# Patient Record
Sex: Male | Born: 1945
Health system: Southern US, Community
[De-identification: ages and names within clinical notes are randomized; demographics above are authoritative.]

## PROBLEM LIST (undated history)

## (undated) DIAGNOSIS — N184 Chronic kidney disease, stage 4 (severe): Secondary | ICD-10-CM

## (undated) DIAGNOSIS — I340 Nonrheumatic mitral (valve) insufficiency: Secondary | ICD-10-CM

## (undated) DIAGNOSIS — I4819 Other persistent atrial fibrillation: Secondary | ICD-10-CM

## (undated) DIAGNOSIS — I2510 Atherosclerotic heart disease of native coronary artery without angina pectoris: Secondary | ICD-10-CM

## (undated) DIAGNOSIS — Z950 Presence of cardiac pacemaker: Secondary | ICD-10-CM

## (undated) DIAGNOSIS — I255 Ischemic cardiomyopathy: Secondary | ICD-10-CM

## (undated) DIAGNOSIS — I4891 Unspecified atrial fibrillation: Secondary | ICD-10-CM

## (undated) DIAGNOSIS — E119 Type 2 diabetes mellitus without complications: Secondary | ICD-10-CM

## (undated) DIAGNOSIS — I10 Essential (primary) hypertension: Secondary | ICD-10-CM

## (undated) DIAGNOSIS — I2119 ST elevation (STEMI) myocardial infarction involving other coronary artery of inferior wall: Secondary | ICD-10-CM

## (undated) DIAGNOSIS — E1169 Type 2 diabetes mellitus with other specified complication: Secondary | ICD-10-CM

## (undated) DIAGNOSIS — I495 Sick sinus syndrome: Secondary | ICD-10-CM

## (undated) DIAGNOSIS — E785 Hyperlipidemia, unspecified: Secondary | ICD-10-CM

## (undated) DIAGNOSIS — E782 Mixed hyperlipidemia: Secondary | ICD-10-CM

## (undated) DIAGNOSIS — I509 Heart failure, unspecified: Secondary | ICD-10-CM

## (undated) DIAGNOSIS — I482 Chronic atrial fibrillation, unspecified: Secondary | ICD-10-CM

## (undated) DIAGNOSIS — I219 Acute myocardial infarction, unspecified: Secondary | ICD-10-CM

## (undated) HISTORY — DX: Atherosclerotic heart disease of native coronary artery without angina pectoris: I25.10

## (undated) HISTORY — PX: PACEMAKER INSERTION: SHX728

## (undated) HISTORY — PX: COLON SURGERY: SHX602

## (undated) HISTORY — DX: Type 2 diabetes mellitus with other specified complication: E11.69

## (undated) HISTORY — DX: Essential (primary) hypertension: I10

## (undated) HISTORY — DX: Chronic atrial fibrillation, unspecified: I48.20

## (undated) HISTORY — DX: Heart failure, unspecified: I50.9

## (undated) HISTORY — DX: Mixed hyperlipidemia: E78.2

## (undated) HISTORY — PX: CORONARY ANGIOPLASTY WITH STENT PLACEMENT: SHX49

## (undated) HISTORY — DX: Hyperlipidemia, unspecified: E78.5

---
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
## 2011-05-24 DIAGNOSIS — G4733 Obstructive sleep apnea (adult) (pediatric): Secondary | ICD-10-CM | POA: Diagnosis not present

## 2011-06-26 DIAGNOSIS — I10 Essential (primary) hypertension: Secondary | ICD-10-CM | POA: Diagnosis not present

## 2011-06-26 DIAGNOSIS — I2510 Atherosclerotic heart disease of native coronary artery without angina pectoris: Secondary | ICD-10-CM | POA: Diagnosis not present

## 2011-06-26 DIAGNOSIS — Z79899 Other long term (current) drug therapy: Secondary | ICD-10-CM | POA: Diagnosis not present

## 2011-06-26 DIAGNOSIS — E7800 Pure hypercholesterolemia, unspecified: Secondary | ICD-10-CM | POA: Diagnosis not present

## 2011-07-07 DIAGNOSIS — E785 Hyperlipidemia, unspecified: Secondary | ICD-10-CM | POA: Diagnosis not present

## 2011-07-07 DIAGNOSIS — I2510 Atherosclerotic heart disease of native coronary artery without angina pectoris: Secondary | ICD-10-CM | POA: Diagnosis not present

## 2011-07-07 DIAGNOSIS — I10 Essential (primary) hypertension: Secondary | ICD-10-CM | POA: Diagnosis not present

## 2011-07-07 DIAGNOSIS — Z79899 Other long term (current) drug therapy: Secondary | ICD-10-CM | POA: Diagnosis not present

## 2011-09-18 DIAGNOSIS — E785 Hyperlipidemia, unspecified: Secondary | ICD-10-CM | POA: Diagnosis not present

## 2011-09-25 DIAGNOSIS — M159 Polyosteoarthritis, unspecified: Secondary | ICD-10-CM | POA: Diagnosis not present

## 2011-09-25 DIAGNOSIS — I10 Essential (primary) hypertension: Secondary | ICD-10-CM | POA: Diagnosis not present

## 2011-09-25 DIAGNOSIS — E119 Type 2 diabetes mellitus without complications: Secondary | ICD-10-CM | POA: Diagnosis not present

## 2011-09-25 DIAGNOSIS — E7800 Pure hypercholesterolemia, unspecified: Secondary | ICD-10-CM | POA: Diagnosis not present

## 2011-10-20 DIAGNOSIS — B354 Tinea corporis: Secondary | ICD-10-CM | POA: Diagnosis not present

## 2011-11-22 DIAGNOSIS — X58XXXA Exposure to other specified factors, initial encounter: Secondary | ICD-10-CM | POA: Diagnosis not present

## 2011-11-22 DIAGNOSIS — T148XXA Other injury of unspecified body region, initial encounter: Secondary | ICD-10-CM | POA: Diagnosis not present

## 2011-11-22 DIAGNOSIS — N318 Other neuromuscular dysfunction of bladder: Secondary | ICD-10-CM | POA: Diagnosis not present

## 2011-12-20 DIAGNOSIS — R7989 Other specified abnormal findings of blood chemistry: Secondary | ICD-10-CM | POA: Diagnosis not present

## 2011-12-20 DIAGNOSIS — I10 Essential (primary) hypertension: Secondary | ICD-10-CM | POA: Diagnosis not present

## 2011-12-20 DIAGNOSIS — E7800 Pure hypercholesterolemia, unspecified: Secondary | ICD-10-CM | POA: Diagnosis not present

## 2011-12-27 DIAGNOSIS — I10 Essential (primary) hypertension: Secondary | ICD-10-CM | POA: Diagnosis not present

## 2011-12-27 DIAGNOSIS — E119 Type 2 diabetes mellitus without complications: Secondary | ICD-10-CM | POA: Diagnosis not present

## 2011-12-27 DIAGNOSIS — E785 Hyperlipidemia, unspecified: Secondary | ICD-10-CM | POA: Diagnosis not present

## 2012-01-01 DIAGNOSIS — R809 Proteinuria, unspecified: Secondary | ICD-10-CM | POA: Diagnosis not present

## 2012-01-12 DIAGNOSIS — E785 Hyperlipidemia, unspecified: Secondary | ICD-10-CM | POA: Diagnosis not present

## 2012-01-12 DIAGNOSIS — I2510 Atherosclerotic heart disease of native coronary artery without angina pectoris: Secondary | ICD-10-CM | POA: Diagnosis not present

## 2012-01-12 DIAGNOSIS — I10 Essential (primary) hypertension: Secondary | ICD-10-CM | POA: Diagnosis not present

## 2012-02-12 DIAGNOSIS — Z23 Encounter for immunization: Secondary | ICD-10-CM | POA: Diagnosis not present

## 2012-03-18 DIAGNOSIS — E119 Type 2 diabetes mellitus without complications: Secondary | ICD-10-CM | POA: Diagnosis not present

## 2012-03-28 DIAGNOSIS — E119 Type 2 diabetes mellitus without complications: Secondary | ICD-10-CM | POA: Diagnosis not present

## 2012-03-28 DIAGNOSIS — I10 Essential (primary) hypertension: Secondary | ICD-10-CM | POA: Diagnosis not present

## 2012-03-28 DIAGNOSIS — E785 Hyperlipidemia, unspecified: Secondary | ICD-10-CM | POA: Diagnosis not present

## 2012-05-23 DIAGNOSIS — G4733 Obstructive sleep apnea (adult) (pediatric): Secondary | ICD-10-CM | POA: Diagnosis not present

## 2012-05-23 DIAGNOSIS — I509 Heart failure, unspecified: Secondary | ICD-10-CM | POA: Diagnosis not present

## 2012-06-21 DIAGNOSIS — E119 Type 2 diabetes mellitus without complications: Secondary | ICD-10-CM | POA: Diagnosis not present

## 2012-06-21 DIAGNOSIS — E785 Hyperlipidemia, unspecified: Secondary | ICD-10-CM | POA: Diagnosis not present

## 2012-06-21 DIAGNOSIS — I10 Essential (primary) hypertension: Secondary | ICD-10-CM | POA: Diagnosis not present

## 2012-07-03 DIAGNOSIS — E785 Hyperlipidemia, unspecified: Secondary | ICD-10-CM | POA: Diagnosis not present

## 2012-07-03 DIAGNOSIS — I2510 Atherosclerotic heart disease of native coronary artery without angina pectoris: Secondary | ICD-10-CM | POA: Diagnosis not present

## 2012-07-03 DIAGNOSIS — I10 Essential (primary) hypertension: Secondary | ICD-10-CM | POA: Diagnosis not present

## 2012-07-03 DIAGNOSIS — E119 Type 2 diabetes mellitus without complications: Secondary | ICD-10-CM | POA: Diagnosis not present

## 2012-07-09 DIAGNOSIS — E785 Hyperlipidemia, unspecified: Secondary | ICD-10-CM | POA: Diagnosis not present

## 2012-07-09 DIAGNOSIS — I2510 Atherosclerotic heart disease of native coronary artery without angina pectoris: Secondary | ICD-10-CM | POA: Diagnosis not present

## 2012-07-09 DIAGNOSIS — I10 Essential (primary) hypertension: Secondary | ICD-10-CM | POA: Diagnosis not present

## 2012-09-19 DIAGNOSIS — E119 Type 2 diabetes mellitus without complications: Secondary | ICD-10-CM | POA: Diagnosis not present

## 2012-09-19 DIAGNOSIS — D696 Thrombocytopenia, unspecified: Secondary | ICD-10-CM | POA: Diagnosis not present

## 2012-09-26 DIAGNOSIS — E119 Type 2 diabetes mellitus without complications: Secondary | ICD-10-CM | POA: Diagnosis not present

## 2012-09-26 DIAGNOSIS — I10 Essential (primary) hypertension: Secondary | ICD-10-CM | POA: Diagnosis not present

## 2012-09-26 DIAGNOSIS — L919 Hypertrophic disorder of the skin, unspecified: Secondary | ICD-10-CM | POA: Diagnosis not present

## 2012-09-26 DIAGNOSIS — M159 Polyosteoarthritis, unspecified: Secondary | ICD-10-CM | POA: Diagnosis not present

## 2012-09-26 DIAGNOSIS — E785 Hyperlipidemia, unspecified: Secondary | ICD-10-CM | POA: Diagnosis not present

## 2012-09-26 DIAGNOSIS — L909 Atrophic disorder of skin, unspecified: Secondary | ICD-10-CM | POA: Diagnosis not present

## 2012-12-30 DIAGNOSIS — Z79899 Other long term (current) drug therapy: Secondary | ICD-10-CM | POA: Diagnosis not present

## 2012-12-30 DIAGNOSIS — I2510 Atherosclerotic heart disease of native coronary artery without angina pectoris: Secondary | ICD-10-CM | POA: Diagnosis not present

## 2012-12-30 DIAGNOSIS — E785 Hyperlipidemia, unspecified: Secondary | ICD-10-CM | POA: Diagnosis not present

## 2012-12-30 DIAGNOSIS — Z23 Encounter for immunization: Secondary | ICD-10-CM | POA: Diagnosis not present

## 2012-12-30 DIAGNOSIS — E119 Type 2 diabetes mellitus without complications: Secondary | ICD-10-CM | POA: Diagnosis not present

## 2012-12-30 DIAGNOSIS — I10 Essential (primary) hypertension: Secondary | ICD-10-CM | POA: Diagnosis not present

## 2013-03-25 DIAGNOSIS — I10 Essential (primary) hypertension: Secondary | ICD-10-CM | POA: Diagnosis not present

## 2013-03-25 DIAGNOSIS — E785 Hyperlipidemia, unspecified: Secondary | ICD-10-CM | POA: Diagnosis not present

## 2013-03-25 DIAGNOSIS — E119 Type 2 diabetes mellitus without complications: Secondary | ICD-10-CM | POA: Diagnosis not present

## 2013-04-01 DIAGNOSIS — E119 Type 2 diabetes mellitus without complications: Secondary | ICD-10-CM | POA: Diagnosis not present

## 2013-04-01 DIAGNOSIS — N529 Male erectile dysfunction, unspecified: Secondary | ICD-10-CM | POA: Diagnosis not present

## 2013-04-01 DIAGNOSIS — E785 Hyperlipidemia, unspecified: Secondary | ICD-10-CM | POA: Diagnosis not present

## 2013-04-01 DIAGNOSIS — I10 Essential (primary) hypertension: Secondary | ICD-10-CM | POA: Diagnosis not present

## 2013-05-22 DIAGNOSIS — G4733 Obstructive sleep apnea (adult) (pediatric): Secondary | ICD-10-CM | POA: Diagnosis not present

## 2013-06-25 DIAGNOSIS — E7800 Pure hypercholesterolemia, unspecified: Secondary | ICD-10-CM | POA: Diagnosis not present

## 2013-06-25 DIAGNOSIS — E119 Type 2 diabetes mellitus without complications: Secondary | ICD-10-CM | POA: Diagnosis not present

## 2013-06-25 DIAGNOSIS — Z719 Counseling, unspecified: Secondary | ICD-10-CM | POA: Diagnosis not present

## 2013-06-25 DIAGNOSIS — E785 Hyperlipidemia, unspecified: Secondary | ICD-10-CM | POA: Diagnosis not present

## 2013-06-25 DIAGNOSIS — I10 Essential (primary) hypertension: Secondary | ICD-10-CM | POA: Diagnosis not present

## 2013-07-21 DIAGNOSIS — E669 Obesity, unspecified: Secondary | ICD-10-CM | POA: Diagnosis not present

## 2013-07-21 DIAGNOSIS — Z9861 Coronary angioplasty status: Secondary | ICD-10-CM | POA: Diagnosis not present

## 2013-07-21 DIAGNOSIS — E785 Hyperlipidemia, unspecified: Secondary | ICD-10-CM | POA: Diagnosis not present

## 2013-07-21 DIAGNOSIS — I2510 Atherosclerotic heart disease of native coronary artery without angina pectoris: Secondary | ICD-10-CM | POA: Diagnosis not present

## 2013-07-30 DIAGNOSIS — I2510 Atherosclerotic heart disease of native coronary artery without angina pectoris: Secondary | ICD-10-CM | POA: Diagnosis not present

## 2013-07-30 DIAGNOSIS — I7090 Unspecified atherosclerosis: Secondary | ICD-10-CM | POA: Diagnosis not present

## 2013-07-30 DIAGNOSIS — E785 Hyperlipidemia, unspecified: Secondary | ICD-10-CM | POA: Diagnosis not present

## 2013-07-30 DIAGNOSIS — Z9889 Other specified postprocedural states: Secondary | ICD-10-CM | POA: Diagnosis not present

## 2013-07-30 DIAGNOSIS — E119 Type 2 diabetes mellitus without complications: Secondary | ICD-10-CM | POA: Diagnosis not present

## 2013-07-30 DIAGNOSIS — I10 Essential (primary) hypertension: Secondary | ICD-10-CM | POA: Diagnosis not present

## 2013-10-21 DIAGNOSIS — I10 Essential (primary) hypertension: Secondary | ICD-10-CM | POA: Diagnosis not present

## 2013-10-21 DIAGNOSIS — Z125 Encounter for screening for malignant neoplasm of prostate: Secondary | ICD-10-CM | POA: Diagnosis not present

## 2013-10-21 DIAGNOSIS — E785 Hyperlipidemia, unspecified: Secondary | ICD-10-CM | POA: Diagnosis not present

## 2013-10-28 DIAGNOSIS — E785 Hyperlipidemia, unspecified: Secondary | ICD-10-CM | POA: Diagnosis not present

## 2013-10-28 DIAGNOSIS — Z139 Encounter for screening, unspecified: Secondary | ICD-10-CM | POA: Diagnosis not present

## 2013-10-28 DIAGNOSIS — R7301 Impaired fasting glucose: Secondary | ICD-10-CM | POA: Diagnosis not present

## 2013-10-28 DIAGNOSIS — I10 Essential (primary) hypertension: Secondary | ICD-10-CM | POA: Diagnosis not present

## 2013-10-28 DIAGNOSIS — E039 Hypothyroidism, unspecified: Secondary | ICD-10-CM | POA: Diagnosis not present

## 2013-10-28 DIAGNOSIS — Z1331 Encounter for screening for depression: Secondary | ICD-10-CM | POA: Diagnosis not present

## 2013-12-02 DIAGNOSIS — Z6841 Body Mass Index (BMI) 40.0 and over, adult: Secondary | ICD-10-CM | POA: Diagnosis not present

## 2013-12-05 DIAGNOSIS — Z1389 Encounter for screening for other disorder: Secondary | ICD-10-CM | POA: Diagnosis not present

## 2013-12-05 DIAGNOSIS — Z139 Encounter for screening, unspecified: Secondary | ICD-10-CM | POA: Diagnosis not present

## 2013-12-05 DIAGNOSIS — Z0000 Encounter for general adult medical examination without abnormal findings: Secondary | ICD-10-CM | POA: Diagnosis not present

## 2013-12-05 DIAGNOSIS — Z1331 Encounter for screening for depression: Secondary | ICD-10-CM | POA: Diagnosis not present

## 2013-12-09 DIAGNOSIS — Z6841 Body Mass Index (BMI) 40.0 and over, adult: Secondary | ICD-10-CM | POA: Diagnosis not present

## 2013-12-16 DIAGNOSIS — E785 Hyperlipidemia, unspecified: Secondary | ICD-10-CM | POA: Diagnosis not present

## 2013-12-16 DIAGNOSIS — Z6841 Body Mass Index (BMI) 40.0 and over, adult: Secondary | ICD-10-CM | POA: Diagnosis not present

## 2013-12-31 DIAGNOSIS — Z6841 Body Mass Index (BMI) 40.0 and over, adult: Secondary | ICD-10-CM | POA: Diagnosis not present

## 2014-01-14 DIAGNOSIS — Z6841 Body Mass Index (BMI) 40.0 and over, adult: Secondary | ICD-10-CM | POA: Diagnosis not present

## 2014-01-28 DIAGNOSIS — Z23 Encounter for immunization: Secondary | ICD-10-CM | POA: Diagnosis not present

## 2014-02-11 DIAGNOSIS — E785 Hyperlipidemia, unspecified: Secondary | ICD-10-CM | POA: Diagnosis not present

## 2014-02-11 DIAGNOSIS — Z6841 Body Mass Index (BMI) 40.0 and over, adult: Secondary | ICD-10-CM | POA: Diagnosis not present

## 2014-02-11 DIAGNOSIS — E1165 Type 2 diabetes mellitus with hyperglycemia: Secondary | ICD-10-CM | POA: Diagnosis not present

## 2014-02-11 DIAGNOSIS — I10 Essential (primary) hypertension: Secondary | ICD-10-CM | POA: Diagnosis not present

## 2014-02-18 DIAGNOSIS — E785 Hyperlipidemia, unspecified: Secondary | ICD-10-CM | POA: Diagnosis not present

## 2014-02-18 DIAGNOSIS — E1169 Type 2 diabetes mellitus with other specified complication: Secondary | ICD-10-CM | POA: Diagnosis not present

## 2014-02-18 DIAGNOSIS — E0869 Diabetes mellitus due to underlying condition with other specified complication: Secondary | ICD-10-CM | POA: Diagnosis not present

## 2014-02-18 DIAGNOSIS — E119 Type 2 diabetes mellitus without complications: Secondary | ICD-10-CM | POA: Diagnosis not present

## 2014-03-10 DIAGNOSIS — E1165 Type 2 diabetes mellitus with hyperglycemia: Secondary | ICD-10-CM | POA: Diagnosis not present

## 2014-03-31 DIAGNOSIS — E119 Type 2 diabetes mellitus without complications: Secondary | ICD-10-CM | POA: Diagnosis not present

## 2014-06-16 DIAGNOSIS — E1169 Type 2 diabetes mellitus with other specified complication: Secondary | ICD-10-CM | POA: Diagnosis not present

## 2014-06-16 DIAGNOSIS — E119 Type 2 diabetes mellitus without complications: Secondary | ICD-10-CM | POA: Diagnosis not present

## 2014-06-16 DIAGNOSIS — E785 Hyperlipidemia, unspecified: Secondary | ICD-10-CM | POA: Diagnosis not present

## 2016-05-31 DIAGNOSIS — D696 Thrombocytopenia, unspecified: Secondary | ICD-10-CM | POA: Diagnosis not present

## 2016-05-31 DIAGNOSIS — E785 Hyperlipidemia, unspecified: Secondary | ICD-10-CM | POA: Diagnosis not present

## 2016-05-31 DIAGNOSIS — E1159 Type 2 diabetes mellitus with other circulatory complications: Secondary | ICD-10-CM | POA: Diagnosis not present

## 2016-05-31 DIAGNOSIS — E119 Type 2 diabetes mellitus without complications: Secondary | ICD-10-CM | POA: Diagnosis not present

## 2016-06-08 DIAGNOSIS — E785 Hyperlipidemia, unspecified: Secondary | ICD-10-CM | POA: Diagnosis not present

## 2016-06-08 DIAGNOSIS — E119 Type 2 diabetes mellitus without complications: Secondary | ICD-10-CM | POA: Diagnosis not present

## 2016-06-08 DIAGNOSIS — I10 Essential (primary) hypertension: Secondary | ICD-10-CM | POA: Diagnosis not present

## 2016-06-08 DIAGNOSIS — E1169 Type 2 diabetes mellitus with other specified complication: Secondary | ICD-10-CM | POA: Diagnosis not present

## 2016-06-13 DIAGNOSIS — Z9861 Coronary angioplasty status: Secondary | ICD-10-CM | POA: Diagnosis not present

## 2016-06-13 DIAGNOSIS — D696 Thrombocytopenia, unspecified: Secondary | ICD-10-CM | POA: Diagnosis not present

## 2016-06-13 DIAGNOSIS — I2510 Atherosclerotic heart disease of native coronary artery without angina pectoris: Secondary | ICD-10-CM | POA: Diagnosis not present

## 2016-06-13 DIAGNOSIS — E1169 Type 2 diabetes mellitus with other specified complication: Secondary | ICD-10-CM | POA: Diagnosis not present

## 2016-06-13 DIAGNOSIS — I252 Old myocardial infarction: Secondary | ICD-10-CM | POA: Diagnosis not present

## 2016-06-14 DIAGNOSIS — M25561 Pain in right knee: Secondary | ICD-10-CM | POA: Diagnosis not present

## 2016-06-14 DIAGNOSIS — M1711 Unilateral primary osteoarthritis, right knee: Secondary | ICD-10-CM | POA: Diagnosis not present

## 2016-06-21 DIAGNOSIS — M1711 Unilateral primary osteoarthritis, right knee: Secondary | ICD-10-CM | POA: Diagnosis not present

## 2016-06-27 DIAGNOSIS — M6281 Muscle weakness (generalized): Secondary | ICD-10-CM | POA: Diagnosis not present

## 2016-06-27 DIAGNOSIS — M1711 Unilateral primary osteoarthritis, right knee: Secondary | ICD-10-CM | POA: Diagnosis not present

## 2016-06-27 DIAGNOSIS — R2689 Other abnormalities of gait and mobility: Secondary | ICD-10-CM | POA: Diagnosis not present

## 2016-06-27 DIAGNOSIS — M25561 Pain in right knee: Secondary | ICD-10-CM | POA: Diagnosis not present

## 2016-06-27 DIAGNOSIS — M25661 Stiffness of right knee, not elsewhere classified: Secondary | ICD-10-CM | POA: Diagnosis not present

## 2016-07-03 DIAGNOSIS — M6281 Muscle weakness (generalized): Secondary | ICD-10-CM | POA: Diagnosis not present

## 2016-07-03 DIAGNOSIS — M1711 Unilateral primary osteoarthritis, right knee: Secondary | ICD-10-CM | POA: Diagnosis not present

## 2016-07-03 DIAGNOSIS — R2689 Other abnormalities of gait and mobility: Secondary | ICD-10-CM | POA: Diagnosis not present

## 2016-07-03 DIAGNOSIS — M25561 Pain in right knee: Secondary | ICD-10-CM | POA: Diagnosis not present

## 2016-07-03 DIAGNOSIS — M25661 Stiffness of right knee, not elsewhere classified: Secondary | ICD-10-CM | POA: Diagnosis not present

## 2016-07-05 DIAGNOSIS — M25561 Pain in right knee: Secondary | ICD-10-CM | POA: Diagnosis not present

## 2016-07-05 DIAGNOSIS — M25661 Stiffness of right knee, not elsewhere classified: Secondary | ICD-10-CM | POA: Diagnosis not present

## 2016-07-05 DIAGNOSIS — R2689 Other abnormalities of gait and mobility: Secondary | ICD-10-CM | POA: Diagnosis not present

## 2016-07-05 DIAGNOSIS — M6281 Muscle weakness (generalized): Secondary | ICD-10-CM | POA: Diagnosis not present

## 2016-07-05 DIAGNOSIS — M1711 Unilateral primary osteoarthritis, right knee: Secondary | ICD-10-CM | POA: Diagnosis not present

## 2016-07-12 DIAGNOSIS — M1711 Unilateral primary osteoarthritis, right knee: Secondary | ICD-10-CM | POA: Diagnosis not present

## 2016-07-12 DIAGNOSIS — M25561 Pain in right knee: Secondary | ICD-10-CM | POA: Diagnosis not present

## 2016-07-12 DIAGNOSIS — M25661 Stiffness of right knee, not elsewhere classified: Secondary | ICD-10-CM | POA: Diagnosis not present

## 2016-07-12 DIAGNOSIS — R2689 Other abnormalities of gait and mobility: Secondary | ICD-10-CM | POA: Diagnosis not present

## 2016-07-12 DIAGNOSIS — M6281 Muscle weakness (generalized): Secondary | ICD-10-CM | POA: Diagnosis not present

## 2016-07-26 DIAGNOSIS — M1711 Unilateral primary osteoarthritis, right knee: Secondary | ICD-10-CM | POA: Diagnosis not present

## 2016-08-02 DIAGNOSIS — M1711 Unilateral primary osteoarthritis, right knee: Secondary | ICD-10-CM | POA: Diagnosis not present

## 2016-08-09 DIAGNOSIS — M1711 Unilateral primary osteoarthritis, right knee: Secondary | ICD-10-CM | POA: Diagnosis not present

## 2016-08-14 DIAGNOSIS — G4733 Obstructive sleep apnea (adult) (pediatric): Secondary | ICD-10-CM | POA: Diagnosis not present

## 2016-08-21 DIAGNOSIS — G4733 Obstructive sleep apnea (adult) (pediatric): Secondary | ICD-10-CM | POA: Diagnosis not present

## 2016-09-08 DIAGNOSIS — M1711 Unilateral primary osteoarthritis, right knee: Secondary | ICD-10-CM | POA: Diagnosis not present

## 2016-09-15 DIAGNOSIS — E119 Type 2 diabetes mellitus without complications: Secondary | ICD-10-CM | POA: Diagnosis not present

## 2016-09-15 DIAGNOSIS — E039 Hypothyroidism, unspecified: Secondary | ICD-10-CM | POA: Diagnosis not present

## 2016-09-15 DIAGNOSIS — E1169 Type 2 diabetes mellitus with other specified complication: Secondary | ICD-10-CM | POA: Diagnosis not present

## 2016-09-15 DIAGNOSIS — I10 Essential (primary) hypertension: Secondary | ICD-10-CM | POA: Diagnosis not present

## 2016-09-26 DIAGNOSIS — E785 Hyperlipidemia, unspecified: Secondary | ICD-10-CM | POA: Diagnosis not present

## 2016-09-26 DIAGNOSIS — E1369 Other specified diabetes mellitus with other specified complication: Secondary | ICD-10-CM | POA: Diagnosis not present

## 2016-09-26 DIAGNOSIS — E039 Hypothyroidism, unspecified: Secondary | ICD-10-CM | POA: Diagnosis not present

## 2016-09-26 DIAGNOSIS — E1169 Type 2 diabetes mellitus with other specified complication: Secondary | ICD-10-CM | POA: Diagnosis not present

## 2016-10-06 DIAGNOSIS — E119 Type 2 diabetes mellitus without complications: Secondary | ICD-10-CM | POA: Diagnosis not present

## 2016-10-06 DIAGNOSIS — H02836 Dermatochalasis of left eye, unspecified eyelid: Secondary | ICD-10-CM | POA: Diagnosis not present

## 2016-10-06 DIAGNOSIS — H26493 Other secondary cataract, bilateral: Secondary | ICD-10-CM | POA: Diagnosis not present

## 2016-10-06 DIAGNOSIS — H02233 Paralytic lagophthalmos right eye, unspecified eyelid: Secondary | ICD-10-CM | POA: Diagnosis not present

## 2016-10-17 DIAGNOSIS — Z789 Other specified health status: Secondary | ICD-10-CM | POA: Diagnosis not present

## 2016-10-17 DIAGNOSIS — Z139 Encounter for screening, unspecified: Secondary | ICD-10-CM | POA: Diagnosis not present

## 2016-10-17 DIAGNOSIS — Z1389 Encounter for screening for other disorder: Secondary | ICD-10-CM | POA: Diagnosis not present

## 2016-10-17 DIAGNOSIS — Z0000 Encounter for general adult medical examination without abnormal findings: Secondary | ICD-10-CM | POA: Diagnosis not present

## 2016-11-13 DIAGNOSIS — G4733 Obstructive sleep apnea (adult) (pediatric): Secondary | ICD-10-CM | POA: Diagnosis not present

## 2016-12-04 DIAGNOSIS — E1159 Type 2 diabetes mellitus with other circulatory complications: Secondary | ICD-10-CM | POA: Diagnosis not present

## 2016-12-04 DIAGNOSIS — Z789 Other specified health status: Secondary | ICD-10-CM | POA: Diagnosis not present

## 2016-12-04 DIAGNOSIS — I10 Essential (primary) hypertension: Secondary | ICD-10-CM | POA: Diagnosis not present

## 2016-12-04 DIAGNOSIS — G4733 Obstructive sleep apnea (adult) (pediatric): Secondary | ICD-10-CM | POA: Diagnosis not present

## 2016-12-15 DIAGNOSIS — B3742 Candidal balanitis: Secondary | ICD-10-CM | POA: Diagnosis not present

## 2016-12-15 DIAGNOSIS — G4733 Obstructive sleep apnea (adult) (pediatric): Secondary | ICD-10-CM | POA: Diagnosis not present

## 2016-12-21 DIAGNOSIS — E1159 Type 2 diabetes mellitus with other circulatory complications: Secondary | ICD-10-CM | POA: Diagnosis not present

## 2016-12-21 DIAGNOSIS — Z7189 Other specified counseling: Secondary | ICD-10-CM | POA: Diagnosis not present

## 2016-12-21 DIAGNOSIS — I10 Essential (primary) hypertension: Secondary | ICD-10-CM | POA: Diagnosis not present

## 2016-12-21 DIAGNOSIS — T7840XA Allergy, unspecified, initial encounter: Secondary | ICD-10-CM | POA: Diagnosis not present

## 2016-12-21 DIAGNOSIS — E785 Hyperlipidemia, unspecified: Secondary | ICD-10-CM | POA: Diagnosis not present

## 2016-12-27 DIAGNOSIS — I480 Paroxysmal atrial fibrillation: Secondary | ICD-10-CM | POA: Diagnosis not present

## 2016-12-27 DIAGNOSIS — D696 Thrombocytopenia, unspecified: Secondary | ICD-10-CM | POA: Diagnosis not present

## 2016-12-27 DIAGNOSIS — I252 Old myocardial infarction: Secondary | ICD-10-CM | POA: Insufficient documentation

## 2016-12-27 DIAGNOSIS — I452 Bifascicular block: Secondary | ICD-10-CM | POA: Insufficient documentation

## 2016-12-27 DIAGNOSIS — E785 Hyperlipidemia, unspecified: Secondary | ICD-10-CM | POA: Insufficient documentation

## 2016-12-27 DIAGNOSIS — I2510 Atherosclerotic heart disease of native coronary artery without angina pectoris: Secondary | ICD-10-CM | POA: Diagnosis not present

## 2016-12-27 DIAGNOSIS — Z955 Presence of coronary angioplasty implant and graft: Secondary | ICD-10-CM | POA: Insufficient documentation

## 2016-12-27 DIAGNOSIS — I4891 Unspecified atrial fibrillation: Secondary | ICD-10-CM | POA: Diagnosis not present

## 2016-12-29 DIAGNOSIS — I480 Paroxysmal atrial fibrillation: Secondary | ICD-10-CM | POA: Diagnosis not present

## 2017-01-02 DIAGNOSIS — I509 Heart failure, unspecified: Secondary | ICD-10-CM | POA: Diagnosis not present

## 2017-01-02 DIAGNOSIS — R0602 Shortness of breath: Secondary | ICD-10-CM | POA: Diagnosis not present

## 2017-01-02 DIAGNOSIS — R0601 Orthopnea: Secondary | ICD-10-CM | POA: Diagnosis not present

## 2017-01-02 DIAGNOSIS — R0600 Dyspnea, unspecified: Secondary | ICD-10-CM | POA: Diagnosis not present

## 2017-01-02 DIAGNOSIS — K8020 Calculus of gallbladder without cholecystitis without obstruction: Secondary | ICD-10-CM | POA: Diagnosis not present

## 2017-01-10 DIAGNOSIS — I10 Essential (primary) hypertension: Secondary | ICD-10-CM | POA: Diagnosis not present

## 2017-01-10 DIAGNOSIS — D696 Thrombocytopenia, unspecified: Secondary | ICD-10-CM | POA: Diagnosis not present

## 2017-01-10 DIAGNOSIS — E1169 Type 2 diabetes mellitus with other specified complication: Secondary | ICD-10-CM | POA: Diagnosis not present

## 2017-01-10 DIAGNOSIS — E119 Type 2 diabetes mellitus without complications: Secondary | ICD-10-CM | POA: Diagnosis not present

## 2017-01-10 DIAGNOSIS — E039 Hypothyroidism, unspecified: Secondary | ICD-10-CM | POA: Diagnosis not present

## 2017-01-12 DIAGNOSIS — Z23 Encounter for immunization: Secondary | ICD-10-CM | POA: Diagnosis not present

## 2017-01-15 DIAGNOSIS — G4733 Obstructive sleep apnea (adult) (pediatric): Secondary | ICD-10-CM | POA: Diagnosis not present

## 2017-01-16 DIAGNOSIS — I4819 Other persistent atrial fibrillation: Secondary | ICD-10-CM | POA: Insufficient documentation

## 2017-01-17 DIAGNOSIS — I440 Atrioventricular block, first degree: Secondary | ICD-10-CM | POA: Diagnosis not present

## 2017-01-17 DIAGNOSIS — I481 Persistent atrial fibrillation: Secondary | ICD-10-CM | POA: Diagnosis not present

## 2017-01-17 DIAGNOSIS — I4510 Unspecified right bundle-branch block: Secondary | ICD-10-CM | POA: Diagnosis not present

## 2017-01-17 DIAGNOSIS — E785 Hyperlipidemia, unspecified: Secondary | ICD-10-CM | POA: Diagnosis not present

## 2017-01-17 DIAGNOSIS — R9431 Abnormal electrocardiogram [ECG] [EKG]: Secondary | ICD-10-CM | POA: Diagnosis not present

## 2017-01-17 DIAGNOSIS — I10 Essential (primary) hypertension: Secondary | ICD-10-CM | POA: Diagnosis not present

## 2017-01-17 DIAGNOSIS — I2510 Atherosclerotic heart disease of native coronary artery without angina pectoris: Secondary | ICD-10-CM | POA: Diagnosis not present

## 2017-01-18 DIAGNOSIS — E1369 Other specified diabetes mellitus with other specified complication: Secondary | ICD-10-CM | POA: Diagnosis not present

## 2017-01-18 DIAGNOSIS — E1159 Type 2 diabetes mellitus with other circulatory complications: Secondary | ICD-10-CM | POA: Diagnosis not present

## 2017-01-18 DIAGNOSIS — E785 Hyperlipidemia, unspecified: Secondary | ICD-10-CM | POA: Diagnosis not present

## 2017-01-18 DIAGNOSIS — Z125 Encounter for screening for malignant neoplasm of prostate: Secondary | ICD-10-CM | POA: Diagnosis not present

## 2017-01-18 DIAGNOSIS — I10 Essential (primary) hypertension: Secondary | ICD-10-CM | POA: Diagnosis not present

## 2017-01-18 DIAGNOSIS — R0609 Other forms of dyspnea: Secondary | ICD-10-CM | POA: Diagnosis not present

## 2017-01-23 DIAGNOSIS — I440 Atrioventricular block, first degree: Secondary | ICD-10-CM | POA: Diagnosis not present

## 2017-01-23 DIAGNOSIS — I4510 Unspecified right bundle-branch block: Secondary | ICD-10-CM | POA: Diagnosis not present

## 2017-01-23 DIAGNOSIS — I10 Essential (primary) hypertension: Secondary | ICD-10-CM | POA: Diagnosis not present

## 2017-01-23 DIAGNOSIS — I25119 Atherosclerotic heart disease of native coronary artery with unspecified angina pectoris: Secondary | ICD-10-CM | POA: Diagnosis not present

## 2017-01-23 DIAGNOSIS — I480 Paroxysmal atrial fibrillation: Secondary | ICD-10-CM | POA: Diagnosis not present

## 2017-01-23 DIAGNOSIS — G4733 Obstructive sleep apnea (adult) (pediatric): Secondary | ICD-10-CM | POA: Diagnosis not present

## 2017-01-23 DIAGNOSIS — I252 Old myocardial infarction: Secondary | ICD-10-CM | POA: Diagnosis not present

## 2017-01-23 DIAGNOSIS — E785 Hyperlipidemia, unspecified: Secondary | ICD-10-CM | POA: Diagnosis not present

## 2017-01-25 DIAGNOSIS — M1711 Unilateral primary osteoarthritis, right knee: Secondary | ICD-10-CM | POA: Diagnosis not present

## 2017-01-25 DIAGNOSIS — M25561 Pain in right knee: Secondary | ICD-10-CM | POA: Diagnosis not present

## 2017-02-13 DIAGNOSIS — G4733 Obstructive sleep apnea (adult) (pediatric): Secondary | ICD-10-CM | POA: Diagnosis not present

## 2017-02-14 DIAGNOSIS — G4733 Obstructive sleep apnea (adult) (pediatric): Secondary | ICD-10-CM | POA: Diagnosis not present

## 2017-02-19 DIAGNOSIS — I4891 Unspecified atrial fibrillation: Secondary | ICD-10-CM | POA: Diagnosis not present

## 2017-02-26 DIAGNOSIS — I4891 Unspecified atrial fibrillation: Secondary | ICD-10-CM | POA: Diagnosis not present

## 2017-03-12 DIAGNOSIS — I4891 Unspecified atrial fibrillation: Secondary | ICD-10-CM | POA: Diagnosis not present

## 2017-03-17 DIAGNOSIS — G4733 Obstructive sleep apnea (adult) (pediatric): Secondary | ICD-10-CM | POA: Diagnosis not present

## 2017-03-26 DIAGNOSIS — I4891 Unspecified atrial fibrillation: Secondary | ICD-10-CM | POA: Diagnosis not present

## 2017-04-09 DIAGNOSIS — I4891 Unspecified atrial fibrillation: Secondary | ICD-10-CM | POA: Diagnosis not present

## 2017-04-11 DIAGNOSIS — D696 Thrombocytopenia, unspecified: Secondary | ICD-10-CM | POA: Diagnosis not present

## 2017-04-11 DIAGNOSIS — E1169 Type 2 diabetes mellitus with other specified complication: Secondary | ICD-10-CM | POA: Diagnosis not present

## 2017-04-11 DIAGNOSIS — I10 Essential (primary) hypertension: Secondary | ICD-10-CM | POA: Diagnosis not present

## 2017-04-11 DIAGNOSIS — E039 Hypothyroidism, unspecified: Secondary | ICD-10-CM | POA: Diagnosis not present

## 2017-04-11 DIAGNOSIS — E119 Type 2 diabetes mellitus without complications: Secondary | ICD-10-CM | POA: Diagnosis not present

## 2017-04-16 DIAGNOSIS — G4733 Obstructive sleep apnea (adult) (pediatric): Secondary | ICD-10-CM | POA: Diagnosis not present

## 2017-04-19 DIAGNOSIS — E1159 Type 2 diabetes mellitus with other circulatory complications: Secondary | ICD-10-CM | POA: Diagnosis not present

## 2017-04-19 DIAGNOSIS — R7989 Other specified abnormal findings of blood chemistry: Secondary | ICD-10-CM | POA: Diagnosis not present

## 2017-04-19 DIAGNOSIS — I10 Essential (primary) hypertension: Secondary | ICD-10-CM | POA: Diagnosis not present

## 2017-04-19 DIAGNOSIS — Z23 Encounter for immunization: Secondary | ICD-10-CM | POA: Diagnosis not present

## 2017-04-19 DIAGNOSIS — E1169 Type 2 diabetes mellitus with other specified complication: Secondary | ICD-10-CM | POA: Diagnosis not present

## 2017-04-19 DIAGNOSIS — E785 Hyperlipidemia, unspecified: Secondary | ICD-10-CM | POA: Diagnosis not present

## 2017-04-24 DIAGNOSIS — I10 Essential (primary) hypertension: Secondary | ICD-10-CM | POA: Diagnosis not present

## 2017-04-24 DIAGNOSIS — I2510 Atherosclerotic heart disease of native coronary artery without angina pectoris: Secondary | ICD-10-CM | POA: Diagnosis not present

## 2017-04-24 DIAGNOSIS — E119 Type 2 diabetes mellitus without complications: Secondary | ICD-10-CM | POA: Diagnosis not present

## 2017-04-24 DIAGNOSIS — I480 Paroxysmal atrial fibrillation: Secondary | ICD-10-CM | POA: Diagnosis not present

## 2017-04-24 DIAGNOSIS — I252 Old myocardial infarction: Secondary | ICD-10-CM | POA: Diagnosis not present

## 2017-05-03 DIAGNOSIS — R7989 Other specified abnormal findings of blood chemistry: Secondary | ICD-10-CM | POA: Diagnosis not present

## 2017-05-07 DIAGNOSIS — Z7901 Long term (current) use of anticoagulants: Secondary | ICD-10-CM | POA: Diagnosis not present

## 2017-05-07 DIAGNOSIS — I4891 Unspecified atrial fibrillation: Secondary | ICD-10-CM | POA: Diagnosis not present

## 2017-05-07 DIAGNOSIS — Z5181 Encounter for therapeutic drug level monitoring: Secondary | ICD-10-CM | POA: Diagnosis not present

## 2017-05-11 DIAGNOSIS — M1711 Unilateral primary osteoarthritis, right knee: Secondary | ICD-10-CM | POA: Diagnosis not present

## 2017-05-16 DIAGNOSIS — G4733 Obstructive sleep apnea (adult) (pediatric): Secondary | ICD-10-CM | POA: Diagnosis not present

## 2017-05-17 DIAGNOSIS — G4733 Obstructive sleep apnea (adult) (pediatric): Secondary | ICD-10-CM | POA: Diagnosis not present

## 2017-05-18 DIAGNOSIS — M1712 Unilateral primary osteoarthritis, left knee: Secondary | ICD-10-CM | POA: Diagnosis not present

## 2017-05-22 DIAGNOSIS — R7989 Other specified abnormal findings of blood chemistry: Secondary | ICD-10-CM | POA: Diagnosis not present

## 2017-05-23 DIAGNOSIS — M1712 Unilateral primary osteoarthritis, left knee: Secondary | ICD-10-CM | POA: Diagnosis not present

## 2017-06-01 DIAGNOSIS — R7989 Other specified abnormal findings of blood chemistry: Secondary | ICD-10-CM | POA: Diagnosis not present

## 2017-06-04 DIAGNOSIS — I4891 Unspecified atrial fibrillation: Secondary | ICD-10-CM | POA: Diagnosis not present

## 2017-06-12 DIAGNOSIS — R7989 Other specified abnormal findings of blood chemistry: Secondary | ICD-10-CM | POA: Diagnosis not present

## 2017-06-17 DIAGNOSIS — G4733 Obstructive sleep apnea (adult) (pediatric): Secondary | ICD-10-CM | POA: Diagnosis not present

## 2017-06-22 DIAGNOSIS — R7989 Other specified abnormal findings of blood chemistry: Secondary | ICD-10-CM | POA: Diagnosis not present

## 2017-06-27 DIAGNOSIS — I480 Paroxysmal atrial fibrillation: Secondary | ICD-10-CM | POA: Diagnosis not present

## 2017-06-27 DIAGNOSIS — Z5181 Encounter for therapeutic drug level monitoring: Secondary | ICD-10-CM | POA: Diagnosis not present

## 2017-06-27 DIAGNOSIS — E785 Hyperlipidemia, unspecified: Secondary | ICD-10-CM | POA: Diagnosis not present

## 2017-06-27 DIAGNOSIS — I2510 Atherosclerotic heart disease of native coronary artery without angina pectoris: Secondary | ICD-10-CM | POA: Diagnosis not present

## 2017-06-27 DIAGNOSIS — Z7901 Long term (current) use of anticoagulants: Secondary | ICD-10-CM | POA: Diagnosis not present

## 2017-06-27 DIAGNOSIS — M25472 Effusion, left ankle: Secondary | ICD-10-CM | POA: Insufficient documentation

## 2017-06-27 DIAGNOSIS — I252 Old myocardial infarction: Secondary | ICD-10-CM | POA: Diagnosis not present

## 2017-06-27 DIAGNOSIS — M25471 Effusion, right ankle: Secondary | ICD-10-CM | POA: Insufficient documentation

## 2017-06-27 DIAGNOSIS — I4891 Unspecified atrial fibrillation: Secondary | ICD-10-CM | POA: Diagnosis not present

## 2017-06-27 DIAGNOSIS — Z955 Presence of coronary angioplasty implant and graft: Secondary | ICD-10-CM | POA: Diagnosis not present

## 2017-07-05 DIAGNOSIS — R7989 Other specified abnormal findings of blood chemistry: Secondary | ICD-10-CM | POA: Diagnosis not present

## 2017-07-10 DIAGNOSIS — I10 Essential (primary) hypertension: Secondary | ICD-10-CM | POA: Diagnosis not present

## 2017-07-10 DIAGNOSIS — E1169 Type 2 diabetes mellitus with other specified complication: Secondary | ICD-10-CM | POA: Diagnosis not present

## 2017-07-10 DIAGNOSIS — E119 Type 2 diabetes mellitus without complications: Secondary | ICD-10-CM | POA: Diagnosis not present

## 2017-07-15 DIAGNOSIS — G4733 Obstructive sleep apnea (adult) (pediatric): Secondary | ICD-10-CM | POA: Diagnosis not present

## 2017-07-24 DIAGNOSIS — E785 Hyperlipidemia, unspecified: Secondary | ICD-10-CM | POA: Diagnosis not present

## 2017-07-24 DIAGNOSIS — E1159 Type 2 diabetes mellitus with other circulatory complications: Secondary | ICD-10-CM | POA: Diagnosis not present

## 2017-07-24 DIAGNOSIS — Z0000 Encounter for general adult medical examination without abnormal findings: Secondary | ICD-10-CM | POA: Diagnosis not present

## 2017-07-24 DIAGNOSIS — Z1331 Encounter for screening for depression: Secondary | ICD-10-CM | POA: Diagnosis not present

## 2017-07-24 DIAGNOSIS — E119 Type 2 diabetes mellitus without complications: Secondary | ICD-10-CM | POA: Diagnosis not present

## 2017-07-24 DIAGNOSIS — I10 Essential (primary) hypertension: Secondary | ICD-10-CM | POA: Diagnosis not present

## 2017-07-24 DIAGNOSIS — Z139 Encounter for screening, unspecified: Secondary | ICD-10-CM | POA: Diagnosis not present

## 2017-07-24 DIAGNOSIS — Z9181 History of falling: Secondary | ICD-10-CM | POA: Diagnosis not present

## 2017-07-24 DIAGNOSIS — E1169 Type 2 diabetes mellitus with other specified complication: Secondary | ICD-10-CM | POA: Diagnosis not present

## 2017-07-25 DIAGNOSIS — I4891 Unspecified atrial fibrillation: Secondary | ICD-10-CM | POA: Diagnosis not present

## 2017-08-07 DIAGNOSIS — E118 Type 2 diabetes mellitus with unspecified complications: Secondary | ICD-10-CM | POA: Diagnosis not present

## 2017-08-10 DIAGNOSIS — M1711 Unilateral primary osteoarthritis, right knee: Secondary | ICD-10-CM | POA: Diagnosis not present

## 2017-08-15 DIAGNOSIS — G4733 Obstructive sleep apnea (adult) (pediatric): Secondary | ICD-10-CM | POA: Diagnosis not present

## 2017-08-17 DIAGNOSIS — M1712 Unilateral primary osteoarthritis, left knee: Secondary | ICD-10-CM | POA: Diagnosis not present

## 2017-08-17 DIAGNOSIS — G4733 Obstructive sleep apnea (adult) (pediatric): Secondary | ICD-10-CM | POA: Diagnosis not present

## 2017-08-22 DIAGNOSIS — Z1211 Encounter for screening for malignant neoplasm of colon: Secondary | ICD-10-CM | POA: Diagnosis not present

## 2017-08-22 DIAGNOSIS — I4891 Unspecified atrial fibrillation: Secondary | ICD-10-CM | POA: Diagnosis not present

## 2017-08-22 DIAGNOSIS — Z5181 Encounter for therapeutic drug level monitoring: Secondary | ICD-10-CM | POA: Diagnosis not present

## 2017-08-22 DIAGNOSIS — Z7901 Long term (current) use of anticoagulants: Secondary | ICD-10-CM | POA: Diagnosis not present

## 2017-09-03 DIAGNOSIS — R002 Palpitations: Secondary | ICD-10-CM | POA: Diagnosis not present

## 2017-09-04 DIAGNOSIS — E118 Type 2 diabetes mellitus with unspecified complications: Secondary | ICD-10-CM | POA: Diagnosis not present

## 2017-09-04 DIAGNOSIS — G4733 Obstructive sleep apnea (adult) (pediatric): Secondary | ICD-10-CM | POA: Diagnosis not present

## 2017-09-04 DIAGNOSIS — E785 Hyperlipidemia, unspecified: Secondary | ICD-10-CM | POA: Diagnosis not present

## 2017-09-04 DIAGNOSIS — I10 Essential (primary) hypertension: Secondary | ICD-10-CM | POA: Diagnosis not present

## 2017-09-14 DIAGNOSIS — G4733 Obstructive sleep apnea (adult) (pediatric): Secondary | ICD-10-CM | POA: Diagnosis not present

## 2017-09-18 DIAGNOSIS — E785 Hyperlipidemia, unspecified: Secondary | ICD-10-CM | POA: Diagnosis not present

## 2017-09-18 DIAGNOSIS — I10 Essential (primary) hypertension: Secondary | ICD-10-CM | POA: Diagnosis not present

## 2017-09-18 DIAGNOSIS — E118 Type 2 diabetes mellitus with unspecified complications: Secondary | ICD-10-CM | POA: Diagnosis not present

## 2017-09-18 DIAGNOSIS — I4891 Unspecified atrial fibrillation: Secondary | ICD-10-CM | POA: Diagnosis not present

## 2017-09-24 DIAGNOSIS — H26493 Other secondary cataract, bilateral: Secondary | ICD-10-CM | POA: Diagnosis not present

## 2017-09-24 DIAGNOSIS — E119 Type 2 diabetes mellitus without complications: Secondary | ICD-10-CM | POA: Diagnosis not present

## 2017-10-04 DIAGNOSIS — E1169 Type 2 diabetes mellitus with other specified complication: Secondary | ICD-10-CM | POA: Diagnosis not present

## 2017-10-04 DIAGNOSIS — I10 Essential (primary) hypertension: Secondary | ICD-10-CM | POA: Diagnosis not present

## 2017-10-15 DIAGNOSIS — G4733 Obstructive sleep apnea (adult) (pediatric): Secondary | ICD-10-CM | POA: Diagnosis not present

## 2017-10-16 DIAGNOSIS — E785 Hyperlipidemia, unspecified: Secondary | ICD-10-CM | POA: Diagnosis not present

## 2017-10-16 DIAGNOSIS — E118 Type 2 diabetes mellitus with unspecified complications: Secondary | ICD-10-CM | POA: Diagnosis not present

## 2017-10-16 DIAGNOSIS — E1169 Type 2 diabetes mellitus with other specified complication: Secondary | ICD-10-CM | POA: Diagnosis not present

## 2017-10-16 DIAGNOSIS — E1159 Type 2 diabetes mellitus with other circulatory complications: Secondary | ICD-10-CM | POA: Diagnosis not present

## 2017-10-16 DIAGNOSIS — I10 Essential (primary) hypertension: Secondary | ICD-10-CM | POA: Diagnosis not present

## 2017-10-17 DIAGNOSIS — I4891 Unspecified atrial fibrillation: Secondary | ICD-10-CM | POA: Diagnosis not present

## 2017-10-24 DIAGNOSIS — E1159 Type 2 diabetes mellitus with other circulatory complications: Secondary | ICD-10-CM | POA: Diagnosis not present

## 2017-10-24 DIAGNOSIS — I10 Essential (primary) hypertension: Secondary | ICD-10-CM | POA: Diagnosis not present

## 2017-10-29 ENCOUNTER — Encounter: Payer: Self-pay | Admitting: Podiatry

## 2017-10-29 ENCOUNTER — Ambulatory Visit (INDEPENDENT_AMBULATORY_CARE_PROVIDER_SITE_OTHER): Payer: Medicare Other | Admitting: Podiatry

## 2017-10-29 VITALS — BP 142/72 | HR 60 | Temp 98.5°F | Resp 15 | Ht 73.0 in | Wt 350.0 lb

## 2017-10-29 DIAGNOSIS — E1151 Type 2 diabetes mellitus with diabetic peripheral angiopathy without gangrene: Secondary | ICD-10-CM | POA: Diagnosis not present

## 2017-10-29 DIAGNOSIS — B351 Tinea unguium: Secondary | ICD-10-CM | POA: Diagnosis not present

## 2017-10-29 MED ORDER — CICLOPIROX 8 % EX SOLN: Freq: Every day | CUTANEOUS | 0 refills | Status: DC

## 2017-10-29 NOTE — Patient Instructions (Signed)

## 2017-10-29 NOTE — Progress Notes (Signed)
  Subjective:  Patient ID: Steven Hester, male    DOB: December 28, 1945,  MRN: [id number]  Chief Complaint  Patient presents with  . Nail Problem    B/L hallux discoloration and thickening x 3 mo; no pain Tx: anti-fungal     72 y.o. male presents with the above complaint.  Reports bilateral nail thickening and discoloration.  Last blood sugar was about 140.  Denies numbness in the feet. No past medical history on file.   Current Outpatient Medications:  .  ciclopirox (PENLAC) 8 % solution, Apply topically at bedtime. Apply over nail and surrounding skin. Apply daily over previous coat. Remove weekly with file or polish remover., Disp: 6.6 mL, Rfl: 0  Allergies  Allergen Reactions  . Neopap [Acetaminophen]    Review of Systems: Negative except as noted in the HPI. Denies N/V/F/Ch. Objective:   General AA&O x3. Normal mood and affect.  Vascular Dorsalis pedis pulses present 1+ bilaterally  Posterior tibial pulses absent bilaterally  Capillary refill normal to all digits. Pedal hair growth normal.  Neurologic Epicritic sensation present bilaterally. Protective sensation with 5.07 monofilament  present bilaterally. Vibratory sensation present bilaterally.  Dermatologic No open lesions. Interspaces clear of maceration.  Normal skin temperature and turgor. Hyperkeratotic lesions: None bilaterally. Nails: brittle, onychomycosis, thickening, elongation  Orthopedic: No history of amputation. MMT 5/5 in dorsiflexion, plantarflexion, inversion, and eversion. Normal lower extremity joint ROM without pain or crepitus.     Assessment & Plan:  Patient was evaluated and treated and all questions answered.  Diabetes with PAD, Onychomycosis -Educated on diabetic footcare. Diabetic risk level 1 -Nails x10 debrided sharply and manually with large nail nipper and rotary burr.  -Rx Penlac  Return in about 3 months (around 01/29/2018) for Diabetic Foot Care.

## 2017-10-29 NOTE — Progress Notes (Signed)
   Subjective:    Patient ID: Steven Hester, male    DOB: 1946/01/29, 72 y.o.   MRN: [id number]  HPI    Review of Systems  All other systems reviewed and are negative.      Objective:   Physical Exam        Assessment & Plan:

## 2017-11-04 DIAGNOSIS — E1159 Type 2 diabetes mellitus with other circulatory complications: Secondary | ICD-10-CM | POA: Diagnosis not present

## 2017-11-04 DIAGNOSIS — E785 Hyperlipidemia, unspecified: Secondary | ICD-10-CM | POA: Diagnosis not present

## 2017-11-04 DIAGNOSIS — I10 Essential (primary) hypertension: Secondary | ICD-10-CM | POA: Diagnosis not present

## 2017-11-04 DIAGNOSIS — E1169 Type 2 diabetes mellitus with other specified complication: Secondary | ICD-10-CM | POA: Diagnosis not present

## 2017-11-09 DIAGNOSIS — M1711 Unilateral primary osteoarthritis, right knee: Secondary | ICD-10-CM | POA: Diagnosis not present

## 2017-11-14 DIAGNOSIS — I4891 Unspecified atrial fibrillation: Secondary | ICD-10-CM | POA: Diagnosis not present

## 2017-11-14 DIAGNOSIS — G4733 Obstructive sleep apnea (adult) (pediatric): Secondary | ICD-10-CM | POA: Diagnosis not present

## 2017-11-14 DIAGNOSIS — Z7901 Long term (current) use of anticoagulants: Secondary | ICD-10-CM | POA: Diagnosis not present

## 2017-11-14 DIAGNOSIS — Z5181 Encounter for therapeutic drug level monitoring: Secondary | ICD-10-CM | POA: Diagnosis not present

## 2017-11-16 DIAGNOSIS — G4733 Obstructive sleep apnea (adult) (pediatric): Secondary | ICD-10-CM | POA: Diagnosis not present

## 2017-11-16 DIAGNOSIS — M1712 Unilateral primary osteoarthritis, left knee: Secondary | ICD-10-CM | POA: Diagnosis not present

## 2017-12-05 DIAGNOSIS — E1159 Type 2 diabetes mellitus with other circulatory complications: Secondary | ICD-10-CM | POA: Diagnosis not present

## 2017-12-05 DIAGNOSIS — E785 Hyperlipidemia, unspecified: Secondary | ICD-10-CM | POA: Diagnosis not present

## 2017-12-05 DIAGNOSIS — E1169 Type 2 diabetes mellitus with other specified complication: Secondary | ICD-10-CM | POA: Diagnosis not present

## 2017-12-05 DIAGNOSIS — I10 Essential (primary) hypertension: Secondary | ICD-10-CM | POA: Diagnosis not present

## 2017-12-12 DIAGNOSIS — Z7901 Long term (current) use of anticoagulants: Secondary | ICD-10-CM | POA: Diagnosis not present

## 2017-12-12 DIAGNOSIS — I252 Old myocardial infarction: Secondary | ICD-10-CM | POA: Diagnosis not present

## 2017-12-12 DIAGNOSIS — E785 Hyperlipidemia, unspecified: Secondary | ICD-10-CM | POA: Diagnosis not present

## 2017-12-12 DIAGNOSIS — I2510 Atherosclerotic heart disease of native coronary artery without angina pectoris: Secondary | ICD-10-CM | POA: Diagnosis not present

## 2017-12-12 DIAGNOSIS — I10 Essential (primary) hypertension: Secondary | ICD-10-CM | POA: Diagnosis not present

## 2017-12-12 DIAGNOSIS — I4891 Unspecified atrial fibrillation: Secondary | ICD-10-CM | POA: Diagnosis not present

## 2017-12-12 DIAGNOSIS — I480 Paroxysmal atrial fibrillation: Secondary | ICD-10-CM | POA: Diagnosis not present

## 2017-12-26 DIAGNOSIS — Z7901 Long term (current) use of anticoagulants: Secondary | ICD-10-CM | POA: Diagnosis not present

## 2017-12-26 DIAGNOSIS — I4891 Unspecified atrial fibrillation: Secondary | ICD-10-CM | POA: Diagnosis not present

## 2017-12-26 DIAGNOSIS — Z5181 Encounter for therapeutic drug level monitoring: Secondary | ICD-10-CM | POA: Diagnosis not present

## 2018-01-04 DIAGNOSIS — E785 Hyperlipidemia, unspecified: Secondary | ICD-10-CM | POA: Diagnosis not present

## 2018-01-04 DIAGNOSIS — E1169 Type 2 diabetes mellitus with other specified complication: Secondary | ICD-10-CM | POA: Diagnosis not present

## 2018-01-04 DIAGNOSIS — E1159 Type 2 diabetes mellitus with other circulatory complications: Secondary | ICD-10-CM | POA: Diagnosis not present

## 2018-01-04 DIAGNOSIS — I10 Essential (primary) hypertension: Secondary | ICD-10-CM | POA: Diagnosis not present

## 2018-01-09 DIAGNOSIS — I4891 Unspecified atrial fibrillation: Secondary | ICD-10-CM | POA: Diagnosis not present

## 2018-01-15 DIAGNOSIS — Z23 Encounter for immunization: Secondary | ICD-10-CM | POA: Diagnosis not present

## 2018-01-15 DIAGNOSIS — R600 Localized edema: Secondary | ICD-10-CM | POA: Diagnosis not present

## 2018-01-15 DIAGNOSIS — R0609 Other forms of dyspnea: Secondary | ICD-10-CM | POA: Diagnosis not present

## 2018-01-22 DIAGNOSIS — R600 Localized edema: Secondary | ICD-10-CM | POA: Diagnosis not present

## 2018-01-28 ENCOUNTER — Encounter: Payer: Self-pay | Admitting: Podiatry

## 2018-01-28 ENCOUNTER — Ambulatory Visit (INDEPENDENT_AMBULATORY_CARE_PROVIDER_SITE_OTHER): Payer: Medicare Other | Admitting: Podiatry

## 2018-01-28 VITALS — BP 139/65 | HR 57 | Resp 16

## 2018-01-28 DIAGNOSIS — E1151 Type 2 diabetes mellitus with diabetic peripheral angiopathy without gangrene: Secondary | ICD-10-CM

## 2018-01-28 DIAGNOSIS — B351 Tinea unguium: Secondary | ICD-10-CM | POA: Diagnosis not present

## 2018-01-28 DIAGNOSIS — D689 Coagulation defect, unspecified: Secondary | ICD-10-CM | POA: Diagnosis not present

## 2018-01-28 DIAGNOSIS — B353 Tinea pedis: Secondary | ICD-10-CM | POA: Diagnosis not present

## 2018-01-28 MED ORDER — FLUCONAZOLE 150 MG PO TABS: 150.0000 mg | ORAL_TABLET | Freq: Once | ORAL | 0 refills | Status: AC

## 2018-01-28 NOTE — Progress Notes (Signed)
  Subjective:  Patient ID: Steven Hester, male    DOB: [birth year],  MRN: [id number]  Chief Complaint  Patient presents with  . debride    BL nail tirmming _FBS: 118 PCP: Dr. Diona Foley seen x 2 wks ago     72 y.o. male presents  for diabetic foot care. Last AMBS was 118. On Coumadin for Anticoagulation. Denies numbness and tingling in their feet. Denies cramping in legs and thighs. Review of Systems: Negative except as noted in the HPI. Denies N/V/F/Ch.  History reviewed. No pertinent past medical history.  Current Outpatient Medications:  .  amiodarone (PACERONE) 200 MG tablet, Take 200 mg by mouth daily., Disp: , Rfl:  .  ciclopirox (PENLAC) 8 % solution, Apply topically at bedtime. Apply over nail and surrounding skin. Apply daily over previous coat. Remove weekly with file or polish remover., Disp: 6.6 mL, Rfl: 0 .  diltiazem (CARDIZEM CD) 120 MG 24 hr capsule, Take 120 mg by mouth daily., Disp: , Rfl:  .  fenofibrate (TRICOR) 145 MG tablet, Take 145 mg by mouth daily., Disp: , Rfl:  .  nystatin (NYSTATIN) powder, Apply topically 4 (four) times daily., Disp: , Rfl:  .  pravastatin (PRAVACHOL) 80 MG tablet, Take 80 mg by mouth daily., Disp: , Rfl:  .  sitaGLIPtin-metformin (JANUMET) 50-1000 MG tablet, Take 1 tablet by mouth 2 (two) times daily with a meal., Disp: , Rfl:  .  warfarin (COUMADIN) 5 MG tablet, Take 5 mg by mouth daily., Disp: , Rfl:  .  fluconazole (DIFLUCAN) 150 MG tablet, Take 1 tablet (150 mg total) by mouth once for 1 dose., Disp: 1 tablet, Rfl: 0  Social History   Tobacco Use  Smoking Status Never Smoker  Smokeless Tobacco Never Used    Allergies  Allergen Reactions  . Neopap [Acetaminophen]    Objective:   Vitals:   01/28/18 0952  BP: 139/65  Pulse: (!) 57  Resp: 16   There is no height or weight on file to calculate BMI. Constitutional Well developed. Well nourished.  Vascular Dorsalis pedis pulses present 1+ bilaterally  Posterior tibial  pulses absent bilaterally  Pedal hair growth diminished. Capillary refill normal to all digits.  No cyanosis or clubbing noted.  Neurologic Normal speech. Oriented to person, place, and time. Epicritic sensation to light touch grossly present bilaterally. Protective sensation with 5.07 monofilament  present bilaterally. Vibratory sensation present bilaterally.  Dermatologic Nails elongated, thickened, dystrophic. No open wounds. R dorsal foot ringlike lesion.  Orthopedic: Normal joint ROM without pain or crepitus bilaterally. No visible deformities. No bony tenderness.   Assessment:   1. Diabetes mellitus type 2 with peripheral artery disease ([place])   2. Onychomycosis   3. Tinea pedis of right foot   4. Coagulation defect [facility])    Plan:  Patient was evaluated and treated and all questions answered.  Diabetes with PAD, Onychomycosis, Coagulation defect -Educated on diabetic footcare. Diabetic risk level 1 -Nails x10 debrided sharply and manually with large nail nipper and rotary burr.   Procedure: Nail Debridement Rationale: Patient meets criteria for routine foot care due to PAD Type of Debridement: manual, sharp debridement. Instrumentation: Nail nipper, rotary burr. Number of Nails: 10  Ringworm R Dosal Foot -Rx Fluconazole once   Return in about 3 months (around 04/29/2018).

## 2018-02-04 DIAGNOSIS — E1159 Type 2 diabetes mellitus with other circulatory complications: Secondary | ICD-10-CM | POA: Diagnosis not present

## 2018-02-04 DIAGNOSIS — I10 Essential (primary) hypertension: Secondary | ICD-10-CM | POA: Diagnosis not present

## 2018-02-05 DIAGNOSIS — R600 Localized edema: Secondary | ICD-10-CM | POA: Diagnosis not present

## 2018-02-07 DIAGNOSIS — R600 Localized edema: Secondary | ICD-10-CM | POA: Diagnosis not present

## 2018-02-07 DIAGNOSIS — D692 Other nonthrombocytopenic purpura: Secondary | ICD-10-CM | POA: Diagnosis not present

## 2018-02-08 DIAGNOSIS — M1711 Unilateral primary osteoarthritis, right knee: Secondary | ICD-10-CM | POA: Diagnosis not present

## 2018-02-13 DIAGNOSIS — Z7901 Long term (current) use of anticoagulants: Secondary | ICD-10-CM | POA: Diagnosis not present

## 2018-02-13 DIAGNOSIS — E7800 Pure hypercholesterolemia, unspecified: Secondary | ICD-10-CM | POA: Diagnosis not present

## 2018-02-13 DIAGNOSIS — I4891 Unspecified atrial fibrillation: Secondary | ICD-10-CM | POA: Diagnosis not present

## 2018-02-18 DIAGNOSIS — G4733 Obstructive sleep apnea (adult) (pediatric): Secondary | ICD-10-CM | POA: Diagnosis not present

## 2018-02-25 DIAGNOSIS — M1712 Unilateral primary osteoarthritis, left knee: Secondary | ICD-10-CM | POA: Diagnosis not present

## 2018-03-04 DIAGNOSIS — E1159 Type 2 diabetes mellitus with other circulatory complications: Secondary | ICD-10-CM | POA: Diagnosis not present

## 2018-03-04 DIAGNOSIS — E1169 Type 2 diabetes mellitus with other specified complication: Secondary | ICD-10-CM | POA: Diagnosis not present

## 2018-03-07 DIAGNOSIS — I10 Essential (primary) hypertension: Secondary | ICD-10-CM | POA: Diagnosis not present

## 2018-03-07 DIAGNOSIS — E785 Hyperlipidemia, unspecified: Secondary | ICD-10-CM | POA: Diagnosis not present

## 2018-03-07 DIAGNOSIS — E1169 Type 2 diabetes mellitus with other specified complication: Secondary | ICD-10-CM | POA: Diagnosis not present

## 2018-03-07 DIAGNOSIS — E1159 Type 2 diabetes mellitus with other circulatory complications: Secondary | ICD-10-CM | POA: Diagnosis not present

## 2018-03-13 DIAGNOSIS — I4891 Unspecified atrial fibrillation: Secondary | ICD-10-CM | POA: Diagnosis not present

## 2018-04-10 DIAGNOSIS — Z5181 Encounter for therapeutic drug level monitoring: Secondary | ICD-10-CM | POA: Diagnosis not present

## 2018-04-10 DIAGNOSIS — I4891 Unspecified atrial fibrillation: Secondary | ICD-10-CM | POA: Diagnosis not present

## 2018-04-10 DIAGNOSIS — Z7901 Long term (current) use of anticoagulants: Secondary | ICD-10-CM | POA: Diagnosis not present

## 2018-05-06 ENCOUNTER — Ambulatory Visit: Payer: Medicare Other | Admitting: Podiatry

## 2018-05-09 DIAGNOSIS — I4891 Unspecified atrial fibrillation: Secondary | ICD-10-CM | POA: Diagnosis not present

## 2018-05-09 DIAGNOSIS — Z7901 Long term (current) use of anticoagulants: Secondary | ICD-10-CM | POA: Diagnosis not present

## 2018-05-13 DIAGNOSIS — M1711 Unilateral primary osteoarthritis, right knee: Secondary | ICD-10-CM | POA: Diagnosis not present

## 2018-05-21 DIAGNOSIS — E782 Mixed hyperlipidemia: Secondary | ICD-10-CM | POA: Diagnosis not present

## 2018-05-21 DIAGNOSIS — M1712 Unilateral primary osteoarthritis, left knee: Secondary | ICD-10-CM | POA: Diagnosis not present

## 2018-05-21 DIAGNOSIS — I10 Essential (primary) hypertension: Secondary | ICD-10-CM | POA: Diagnosis not present

## 2018-05-21 DIAGNOSIS — E1169 Type 2 diabetes mellitus with other specified complication: Secondary | ICD-10-CM | POA: Diagnosis not present

## 2018-05-21 DIAGNOSIS — G4733 Obstructive sleep apnea (adult) (pediatric): Secondary | ICD-10-CM | POA: Diagnosis not present

## 2018-05-21 DIAGNOSIS — M1711 Unilateral primary osteoarthritis, right knee: Secondary | ICD-10-CM | POA: Diagnosis not present

## 2018-05-29 DIAGNOSIS — M1712 Unilateral primary osteoarthritis, left knee: Secondary | ICD-10-CM | POA: Diagnosis not present

## 2018-06-07 DIAGNOSIS — Z87891 Personal history of nicotine dependence: Secondary | ICD-10-CM | POA: Diagnosis not present

## 2018-06-07 DIAGNOSIS — I480 Paroxysmal atrial fibrillation: Secondary | ICD-10-CM | POA: Diagnosis not present

## 2018-07-05 DIAGNOSIS — I4891 Unspecified atrial fibrillation: Secondary | ICD-10-CM | POA: Diagnosis not present

## 2018-07-05 DIAGNOSIS — Z87891 Personal history of nicotine dependence: Secondary | ICD-10-CM | POA: Diagnosis not present

## 2018-07-17 DIAGNOSIS — J180 Bronchopneumonia, unspecified organism: Secondary | ICD-10-CM | POA: Diagnosis not present

## 2018-07-17 DIAGNOSIS — J0100 Acute maxillary sinusitis, unspecified: Secondary | ICD-10-CM | POA: Diagnosis not present

## 2018-08-02 DIAGNOSIS — I480 Paroxysmal atrial fibrillation: Secondary | ICD-10-CM | POA: Diagnosis not present

## 2018-08-02 DIAGNOSIS — Z5181 Encounter for therapeutic drug level monitoring: Secondary | ICD-10-CM | POA: Diagnosis not present

## 2018-08-02 DIAGNOSIS — Z7901 Long term (current) use of anticoagulants: Secondary | ICD-10-CM | POA: Diagnosis not present

## 2018-08-06 DIAGNOSIS — M1712 Unilateral primary osteoarthritis, left knee: Secondary | ICD-10-CM | POA: Diagnosis not present

## 2018-08-06 DIAGNOSIS — M1711 Unilateral primary osteoarthritis, right knee: Secondary | ICD-10-CM | POA: Diagnosis not present

## 2018-08-12 DIAGNOSIS — M1711 Unilateral primary osteoarthritis, right knee: Secondary | ICD-10-CM | POA: Diagnosis not present

## 2018-08-20 DIAGNOSIS — G4733 Obstructive sleep apnea (adult) (pediatric): Secondary | ICD-10-CM | POA: Diagnosis not present

## 2018-08-21 DIAGNOSIS — I10 Essential (primary) hypertension: Secondary | ICD-10-CM | POA: Diagnosis not present

## 2018-08-21 DIAGNOSIS — E782 Mixed hyperlipidemia: Secondary | ICD-10-CM | POA: Diagnosis not present

## 2018-08-21 DIAGNOSIS — E1122 Type 2 diabetes mellitus with diabetic chronic kidney disease: Secondary | ICD-10-CM | POA: Diagnosis not present

## 2018-08-21 DIAGNOSIS — E1169 Type 2 diabetes mellitus with other specified complication: Secondary | ICD-10-CM | POA: Diagnosis not present

## 2018-08-21 DIAGNOSIS — I482 Chronic atrial fibrillation, unspecified: Secondary | ICD-10-CM | POA: Diagnosis not present

## 2018-08-28 DIAGNOSIS — M1711 Unilateral primary osteoarthritis, right knee: Secondary | ICD-10-CM | POA: Diagnosis not present

## 2018-08-28 DIAGNOSIS — M1712 Unilateral primary osteoarthritis, left knee: Secondary | ICD-10-CM | POA: Diagnosis not present

## 2018-08-28 DIAGNOSIS — I4891 Unspecified atrial fibrillation: Secondary | ICD-10-CM | POA: Diagnosis not present

## 2018-09-25 DIAGNOSIS — I4891 Unspecified atrial fibrillation: Secondary | ICD-10-CM | POA: Diagnosis not present

## 2018-10-16 DIAGNOSIS — E119 Type 2 diabetes mellitus without complications: Secondary | ICD-10-CM | POA: Diagnosis not present

## 2018-10-23 DIAGNOSIS — I4891 Unspecified atrial fibrillation: Secondary | ICD-10-CM | POA: Diagnosis not present

## 2018-10-28 DIAGNOSIS — J811 Chronic pulmonary edema: Secondary | ICD-10-CM | POA: Diagnosis not present

## 2018-10-28 DIAGNOSIS — I517 Cardiomegaly: Secondary | ICD-10-CM | POA: Diagnosis not present

## 2018-10-28 DIAGNOSIS — E782 Mixed hyperlipidemia: Secondary | ICD-10-CM | POA: Diagnosis not present

## 2018-10-28 DIAGNOSIS — I10 Essential (primary) hypertension: Secondary | ICD-10-CM | POA: Diagnosis not present

## 2018-10-28 DIAGNOSIS — I878 Other specified disorders of veins: Secondary | ICD-10-CM | POA: Diagnosis not present

## 2018-10-28 DIAGNOSIS — E1122 Type 2 diabetes mellitus with diabetic chronic kidney disease: Secondary | ICD-10-CM | POA: Diagnosis not present

## 2018-10-28 DIAGNOSIS — N183 Chronic kidney disease, stage 3 (moderate): Secondary | ICD-10-CM | POA: Diagnosis not present

## 2018-10-28 DIAGNOSIS — J180 Bronchopneumonia, unspecified organism: Secondary | ICD-10-CM | POA: Diagnosis not present

## 2018-10-28 DIAGNOSIS — J90 Pleural effusion, not elsewhere classified: Secondary | ICD-10-CM | POA: Diagnosis not present

## 2018-10-28 DIAGNOSIS — I482 Chronic atrial fibrillation, unspecified: Secondary | ICD-10-CM | POA: Diagnosis not present

## 2018-10-30 DIAGNOSIS — I10 Essential (primary) hypertension: Secondary | ICD-10-CM | POA: Diagnosis not present

## 2018-10-30 DIAGNOSIS — Z7901 Long term (current) use of anticoagulants: Secondary | ICD-10-CM | POA: Diagnosis not present

## 2018-10-30 DIAGNOSIS — I252 Old myocardial infarction: Secondary | ICD-10-CM | POA: Diagnosis not present

## 2018-10-30 DIAGNOSIS — I452 Bifascicular block: Secondary | ICD-10-CM | POA: Diagnosis not present

## 2018-10-30 DIAGNOSIS — I2510 Atherosclerotic heart disease of native coronary artery without angina pectoris: Secondary | ICD-10-CM | POA: Diagnosis not present

## 2018-11-06 DIAGNOSIS — R0602 Shortness of breath: Secondary | ICD-10-CM | POA: Diagnosis not present

## 2018-11-06 DIAGNOSIS — I2510 Atherosclerotic heart disease of native coronary artery without angina pectoris: Secondary | ICD-10-CM | POA: Diagnosis not present

## 2018-11-18 DIAGNOSIS — J180 Bronchopneumonia, unspecified organism: Secondary | ICD-10-CM | POA: Diagnosis not present

## 2018-11-19 DIAGNOSIS — G4733 Obstructive sleep apnea (adult) (pediatric): Secondary | ICD-10-CM | POA: Diagnosis not present

## 2018-11-20 DIAGNOSIS — I4891 Unspecified atrial fibrillation: Secondary | ICD-10-CM | POA: Diagnosis not present

## 2018-11-25 DIAGNOSIS — J449 Chronic obstructive pulmonary disease, unspecified: Secondary | ICD-10-CM | POA: Diagnosis not present

## 2018-11-25 DIAGNOSIS — J9611 Chronic respiratory failure with hypoxia: Secondary | ICD-10-CM | POA: Diagnosis not present

## 2018-11-25 LAB — PULMONARY FUNCTION TEST

## 2018-11-26 DIAGNOSIS — J449 Chronic obstructive pulmonary disease, unspecified: Secondary | ICD-10-CM | POA: Diagnosis not present

## 2018-11-26 DIAGNOSIS — R0602 Shortness of breath: Secondary | ICD-10-CM | POA: Diagnosis not present

## 2018-11-26 DIAGNOSIS — I5033 Acute on chronic diastolic (congestive) heart failure: Secondary | ICD-10-CM | POA: Diagnosis not present

## 2018-12-05 DIAGNOSIS — J9611 Chronic respiratory failure with hypoxia: Secondary | ICD-10-CM | POA: Diagnosis not present

## 2018-12-05 DIAGNOSIS — J181 Lobar pneumonia, unspecified organism: Secondary | ICD-10-CM | POA: Diagnosis not present

## 2018-12-05 DIAGNOSIS — I700 Atherosclerosis of aorta: Secondary | ICD-10-CM | POA: Diagnosis not present

## 2018-12-05 DIAGNOSIS — K8020 Calculus of gallbladder without cholecystitis without obstruction: Secondary | ICD-10-CM | POA: Diagnosis not present

## 2018-12-05 DIAGNOSIS — J90 Pleural effusion, not elsewhere classified: Secondary | ICD-10-CM | POA: Diagnosis not present

## 2018-12-05 DIAGNOSIS — I517 Cardiomegaly: Secondary | ICD-10-CM | POA: Diagnosis not present

## 2018-12-05 DIAGNOSIS — R918 Other nonspecific abnormal finding of lung field: Secondary | ICD-10-CM | POA: Diagnosis not present

## 2018-12-06 DIAGNOSIS — Z1159 Encounter for screening for other viral diseases: Secondary | ICD-10-CM | POA: Diagnosis not present

## 2018-12-06 DIAGNOSIS — J9611 Chronic respiratory failure with hypoxia: Secondary | ICD-10-CM | POA: Diagnosis not present

## 2018-12-09 DIAGNOSIS — Z1159 Encounter for screening for other viral diseases: Secondary | ICD-10-CM | POA: Diagnosis not present

## 2018-12-09 DIAGNOSIS — Z0000 Encounter for general adult medical examination without abnormal findings: Secondary | ICD-10-CM | POA: Diagnosis not present

## 2018-12-18 DIAGNOSIS — E1169 Type 2 diabetes mellitus with other specified complication: Secondary | ICD-10-CM | POA: Diagnosis not present

## 2018-12-18 DIAGNOSIS — E782 Mixed hyperlipidemia: Secondary | ICD-10-CM | POA: Diagnosis not present

## 2018-12-18 DIAGNOSIS — E1122 Type 2 diabetes mellitus with diabetic chronic kidney disease: Secondary | ICD-10-CM | POA: Diagnosis not present

## 2018-12-18 DIAGNOSIS — I10 Essential (primary) hypertension: Secondary | ICD-10-CM | POA: Diagnosis not present

## 2018-12-18 DIAGNOSIS — I482 Chronic atrial fibrillation, unspecified: Secondary | ICD-10-CM | POA: Diagnosis not present

## 2018-12-19 ENCOUNTER — Ambulatory Visit: Payer: Medicare Other | Admitting: Pulmonary Disease

## 2018-12-19 ENCOUNTER — Other Ambulatory Visit: Payer: Self-pay

## 2018-12-19 ENCOUNTER — Encounter: Payer: Self-pay | Admitting: Pulmonary Disease

## 2018-12-19 VITALS — BP 148/80 | HR 66 | Temp 98.0°F | Ht 73.0 in | Wt 354.0 lb

## 2018-12-19 DIAGNOSIS — R0602 Shortness of breath: Secondary | ICD-10-CM | POA: Diagnosis not present

## 2018-12-19 DIAGNOSIS — Z8701 Personal history of pneumonia (recurrent): Secondary | ICD-10-CM

## 2018-12-19 DIAGNOSIS — I5033 Acute on chronic diastolic (congestive) heart failure: Secondary | ICD-10-CM | POA: Diagnosis not present

## 2018-12-19 DIAGNOSIS — R590 Localized enlarged lymph nodes: Secondary | ICD-10-CM

## 2018-12-19 DIAGNOSIS — J90 Pleural effusion, not elsewhere classified: Secondary | ICD-10-CM | POA: Diagnosis not present

## 2018-12-19 NOTE — Progress Notes (Signed)
Synopsis: Referred in AUG 2020 for COPD by Marco Collie, MD  Subjective:   PATIENT ID: Steven Hester GENDER: male DOB: [birth year], MRN: [id number]  Chief Complaint  Patient presents with  . Consult    Placed on oxygen last friday. States he recently had PNE and was later placed on PNE. He recently had a MRI.     73 year old gentleman chronic hypoxemic respiratory failure, h/o MI, stentsX4, in [place].  Placed on 2 L nasal cannula by primary care.  Patient had CT chest revealed small bilateral pleural effusions, right slightly greater than left, dense consolidation of the right lower lobe compressive atelectasis no evidence of a central obstructing mass.  Mild cardiomegaly.  Enlarged right lower paratracheal lymph node. Quit smoking 50+ years ago, 10-15 years, 1 ppd. Worked in [place].  Patient complains of ongoing bilateral lower extremity swelling.  Does have significant exertional dyspnea with climbing stairs.  Even walking briskly on level ground feels short of breath.  Denies chest pain.  He is always been heavy.  Current weight at over 350 pounds.  Never really tried to lose weight in the past.    Past Medical History:  Diagnosis Date  . Atrial fibrillation ([place])   . CAD (coronary artery disease)   . DM II (diabetes mellitus, type II), controlled ([place])   . HLD (hyperlipidemia)   . MI (myocardial infarction) ([place])      Family History  Problem Relation Age of Onset  . Heart attack Mother   . GI Disease Father   . Cancer Father      Past Surgical History:  Procedure Laterality Date  . CORONARY ANGIOPLASTY WITH STENT PLACEMENT      Social History   Socioeconomic History  . Marital status: Married    Spouse name: Not on file  . Number of children: Not on file  . Years of education: Not on file  . Highest education level: Not on file  Occupational History  . Not on file  Social Needs  . Financial resource strain: Not on file  . Food insecurity    Worry: Not  on file    Inability: Not on file  . Transportation needs    Medical: Not on file    Non-medical: Not on file  Tobacco Use  . Smoking status: Former Smoker    Packs/day: 1.00    Years: 10.00    Pack years: 10.00    Types: Cigarettes  . Smokeless tobacco: Never Used  Substance and Sexual Activity  . Alcohol use: Never    Frequency: Never  . Drug use: Never  . Sexual activity: Not on file  Lifestyle  . Physical activity    Days per week: Not on file    Minutes per session: Not on file  . Stress: Not on file  Relationships  . Social Herbalist on phone: Not on file    Gets together: Not on file    Attends religious service: Not on file    Active member of club or organization: Not on file    Attends meetings of clubs or organizations: Not on file    Relationship status: Not on file  . Intimate partner violence    Fear of current or ex partner: Not on file    Emotionally abused: Not on file    Physically abused: Not on file    Forced sexual activity: Not on file  Other Topics Concern  . Not on file  Social History Narrative  . Not on file     Allergies  Allergen Reactions  . Neopap [Acetaminophen]      Outpatient Medications Prior to Visit  Medication Sig Dispense Refill  . amiodarone (PACERONE) 200 MG tablet Take 200 mg by mouth daily.    Marland Kitchen diltiazem (CARDIZEM CD) 120 MG 24 hr capsule Take 120 mg by mouth daily.    . fenofibrate (TRICOR) 145 MG tablet Take 145 mg by mouth daily.    . furosemide (LASIX) 40 MG tablet     . nystatin (NYSTATIN) powder Apply topically 4 (four) times daily.    . pravastatin (PRAVACHOL) 80 MG tablet Take 80 mg by mouth daily.    . sitaGLIPtin-metformin (JANUMET) 50-1000 MG tablet Take 1 tablet by mouth 2 (two) times daily with a meal.    . umeclidinium-vilanterol (ANORO ELLIPTA) 62.5-25 MCG/INH AEPB Inhale 1 puff into the lungs daily.    Marland Kitchen warfarin (COUMADIN) 5 MG tablet Take 5 mg by mouth daily.    . ciclopirox (PENLAC) 8 %  solution Apply topically at bedtime. Apply over nail and surrounding skin. Apply daily over previous coat. Remove weekly with file or polish remover. 6.6 mL 0   No facility-administered medications prior to visit.     Review of Systems  Constitutional: Negative for chills, fever, malaise/fatigue and weight loss.  HENT: Negative for hearing loss, sore throat and tinnitus.   Eyes: Negative for blurred vision and double vision.  Respiratory: Positive for cough, sputum production and shortness of breath. Negative for hemoptysis, wheezing and stridor.   Cardiovascular: Positive for orthopnea and leg swelling. Negative for chest pain, palpitations and PND.  Gastrointestinal: Negative for abdominal pain, constipation, diarrhea, heartburn, nausea and vomiting.  Genitourinary: Negative for dysuria, hematuria and urgency.  Musculoskeletal: Negative for joint pain and myalgias.  Skin: Negative for itching and rash.  Neurological: Negative for dizziness, tingling, weakness and headaches.  Endo/Heme/Allergies: Negative for environmental allergies. Does not bruise/bleed easily.  Psychiatric/Behavioral: Negative for depression. The patient is not nervous/anxious and does not have insomnia.   All other systems reviewed and are negative.    Objective:  Physical Exam Vitals signs reviewed.  Constitutional:      General: He is not in acute distress.    Appearance: He is well-developed. He is obese.  HENT:     Head: Normocephalic and atraumatic.  Eyes:     General: No scleral icterus.    Conjunctiva/sclera: Conjunctivae normal.     Pupils: Pupils are equal, round, and reactive to light.  Neck:     Musculoskeletal: Neck supple.     Vascular: No JVD.     Trachea: No tracheal deviation.     Comments: Large neck Cardiovascular:     Rate and Rhythm: Normal rate and regular rhythm.     Heart sounds: Normal heart sounds. No murmur.  Pulmonary:     Effort: Pulmonary effort is normal. No tachypnea,  accessory muscle usage or respiratory distress.     Breath sounds: Normal breath sounds. No stridor. No wheezing, rhonchi or rales.  Abdominal:     General: Bowel sounds are normal. There is no distension.     Palpations: Abdomen is soft.     Tenderness: There is no abdominal tenderness.     Comments: Central adiposity  Musculoskeletal:        General: No tenderness.     Right lower leg: Edema present.     Left lower leg: Edema present.  Comments: Pitting edema  Lymphadenopathy:     Cervical: No cervical adenopathy.  Skin:    General: Skin is warm and dry.     Capillary Refill: Capillary refill takes less than 2 seconds.     Findings: No rash.  Neurological:     Mental Status: He is alert and oriented to person, place, and time.  Psychiatric:        Behavior: Behavior normal.      Vitals:   12/19/18 1625  BP: (!) 148/80  Pulse: 66  Temp: 98 F (36.7 C)  TempSrc: Oral  SpO2: 95%  Weight: (!) 354 lb (160.6 kg)  Height: 6\' 1"  (1.854 m)   95% on 2 L/min BMI Readings from Last 3 Encounters:  12/19/18 46.70 kg/m  10/29/17 46.18 kg/m   Wt Readings from Last 3 Encounters:  12/19/18 (!) 354 lb (160.6 kg)  10/29/17 (!) 350 lb (158.8 kg)     CBC No results found for: WBC, RBC, HGB, HCT, PLT, MCV, MCH, MCHC, RDW, LYMPHSABS, MONOABS, EOSABS, BASOSABS  Chest Imaging:  CT chest 12/05/2018: Small bilateral pleural effusion right greater than left, dense consolidation of the right lower lobe compressive atelectasis, likely pneumonia no obstructive mass, cardiomegaly diffuse coronary calcifications cholelithiasis. The patient's images have been independently reviewed by me.    Pulmonary Functions Testing Results: No flowsheet data found.  FeNO: None   Pathology: None   Echocardiogram: None   Heart Catheterization: None    Assessment & Plan:     ICD-10-CM   1. SOB (shortness of breath)  R06.02   2. Pleural effusion  J90   3. H/O: pneumonia  Z87.01 CT Chest Wo  Contrast  4. Mediastinal adenopathy  R59.0   5. Acute on chronic diastolic heart failure (HCC)  I50.33     Discussion:  This is a 73 year old morbidly obese, BMI of 46, gentleman that presents to the office with shortness of breath and chronic hypoxemic respiratory failure.  A large portion of this is him recovering from his right lower lobe pneumonia that was seen on CT imaging less than 2 weeks ago.  He also had a enlarged lower paratracheal lymph node which is likely reactive secondary to his pneumonia history.  He was treated with antibiotics and has subsequently improved.  I do think it is pertinent that we ensure that the lower lobe dense consolidation has resolved as well as improvement in the adenopathy.  He does have a smoking history but for only approximately 10 to 15 years.  We need to ensure that this resolves and there is no underlying mass lesion or enlargement of the adenopathy.  We will have a repeat noncontrasted CT of the chest in 3 months.  As for the pneumonia the CT scan will also help clarify if there is clearance. He does have bilateral pleural effusion seen on imaging in conjunction with significant volume overload on exam.  He potentially has underlying obstructive sleep apnea.  He also has likely chronic diastolic heart failure.  I encouraged him to follow-up with his cardiologist.  He needs to stay active and he must lose weight.  With this was discussed with the patient today in the office.  Patient to return to clinic in 3 months following CT imaging.  Greater than 50% of this patient's 45-minute of visit was spent face-to-face discussing the above recommendations and treatment plan.   Current Outpatient Medications:  .  amiodarone (PACERONE) 200 MG tablet, Take 200 mg by mouth daily., Disp: ,  Rfl:  .  diltiazem (CARDIZEM CD) 120 MG 24 hr capsule, Take 120 mg by mouth daily., Disp: , Rfl:  .  fenofibrate (TRICOR) 145 MG tablet, Take 145 mg by mouth daily.,  Disp: , Rfl:  .  furosemide (LASIX) 40 MG tablet, , Disp: , Rfl:  .  nystatin (NYSTATIN) powder, Apply topically 4 (four) times daily., Disp: , Rfl:  .  pravastatin (PRAVACHOL) 80 MG tablet, Take 80 mg by mouth daily., Disp: , Rfl:  .  sitaGLIPtin-metformin (JANUMET) 50-1000 MG tablet, Take 1 tablet by mouth 2 (two) times daily with a meal., Disp: , Rfl:  .  umeclidinium-vilanterol (ANORO ELLIPTA) 62.5-25 MCG/INH AEPB, Inhale 1 puff into the lungs daily., Disp: , Rfl:  .  warfarin (COUMADIN) 5 MG tablet, Take 5 mg by mouth daily., Disp: , Rfl:  .  ciclopirox (PENLAC) 8 % solution, Apply topically at bedtime. Apply over nail and surrounding skin. Apply daily over previous coat. Remove weekly with file or polish remover., Disp: 6.6 mL, Rfl: 0   Garner Nash, DO [facility] 12/19/2018 5:02 PM

## 2018-12-19 NOTE — Patient Instructions (Addendum)
Thank you for visiting Dr. Valeta Harms at [facility]. Today we recommend the following: Orders Placed This Encounter  Procedures  . CT Chest Wo Contrast   Return in about 3 months (around 03/21/2019) for with APP or Dr. Valeta Harms.    Please do your part to reduce the spread of COVID-19.

## 2018-12-21 DIAGNOSIS — J189 Pneumonia, unspecified organism: Secondary | ICD-10-CM | POA: Diagnosis not present

## 2018-12-21 DIAGNOSIS — R0602 Shortness of breath: Secondary | ICD-10-CM | POA: Diagnosis not present

## 2018-12-21 DIAGNOSIS — R0902 Hypoxemia: Secondary | ICD-10-CM | POA: Diagnosis not present

## 2018-12-21 DIAGNOSIS — R609 Edema, unspecified: Secondary | ICD-10-CM | POA: Diagnosis not present

## 2018-12-22 DIAGNOSIS — I272 Pulmonary hypertension, unspecified: Secondary | ICD-10-CM | POA: Diagnosis not present

## 2018-12-22 DIAGNOSIS — R0601 Orthopnea: Secondary | ICD-10-CM

## 2018-12-22 DIAGNOSIS — I361 Nonrheumatic tricuspid (valve) insufficiency: Secondary | ICD-10-CM | POA: Diagnosis not present

## 2018-12-22 DIAGNOSIS — I110 Hypertensive heart disease with heart failure: Secondary | ICD-10-CM | POA: Diagnosis not present

## 2018-12-22 DIAGNOSIS — R609 Edema, unspecified: Secondary | ICD-10-CM | POA: Diagnosis not present

## 2018-12-22 DIAGNOSIS — I4510 Unspecified right bundle-branch block: Secondary | ICD-10-CM | POA: Diagnosis not present

## 2018-12-22 DIAGNOSIS — I4891 Unspecified atrial fibrillation: Secondary | ICD-10-CM | POA: Diagnosis not present

## 2018-12-22 DIAGNOSIS — J189 Pneumonia, unspecified organism: Secondary | ICD-10-CM | POA: Diagnosis not present

## 2018-12-22 DIAGNOSIS — J449 Chronic obstructive pulmonary disease, unspecified: Secondary | ICD-10-CM | POA: Diagnosis not present

## 2018-12-22 DIAGNOSIS — J90 Pleural effusion, not elsewhere classified: Secondary | ICD-10-CM | POA: Diagnosis not present

## 2018-12-22 DIAGNOSIS — Z79899 Other long term (current) drug therapy: Secondary | ICD-10-CM | POA: Diagnosis not present

## 2018-12-22 DIAGNOSIS — E876 Hypokalemia: Secondary | ICD-10-CM | POA: Diagnosis not present

## 2018-12-22 DIAGNOSIS — E785 Hyperlipidemia, unspecified: Secondary | ICD-10-CM | POA: Diagnosis not present

## 2018-12-22 DIAGNOSIS — Z955 Presence of coronary angioplasty implant and graft: Secondary | ICD-10-CM | POA: Diagnosis not present

## 2018-12-22 DIAGNOSIS — Z87891 Personal history of nicotine dependence: Secondary | ICD-10-CM | POA: Diagnosis not present

## 2018-12-22 DIAGNOSIS — I872 Venous insufficiency (chronic) (peripheral): Secondary | ICD-10-CM | POA: Diagnosis not present

## 2018-12-22 DIAGNOSIS — I340 Nonrheumatic mitral (valve) insufficiency: Secondary | ICD-10-CM | POA: Diagnosis not present

## 2018-12-22 DIAGNOSIS — N179 Acute kidney failure, unspecified: Secondary | ICD-10-CM | POA: Diagnosis not present

## 2018-12-22 DIAGNOSIS — R0602 Shortness of breath: Secondary | ICD-10-CM | POA: Diagnosis not present

## 2018-12-22 DIAGNOSIS — R0902 Hypoxemia: Secondary | ICD-10-CM | POA: Diagnosis not present

## 2018-12-22 DIAGNOSIS — N289 Disorder of kidney and ureter, unspecified: Secondary | ICD-10-CM | POA: Diagnosis not present

## 2018-12-22 DIAGNOSIS — Z7901 Long term (current) use of anticoagulants: Secondary | ICD-10-CM | POA: Diagnosis not present

## 2018-12-22 DIAGNOSIS — I351 Nonrheumatic aortic (valve) insufficiency: Secondary | ICD-10-CM | POA: Diagnosis not present

## 2018-12-22 DIAGNOSIS — J9811 Atelectasis: Secondary | ICD-10-CM | POA: Diagnosis not present

## 2018-12-22 DIAGNOSIS — J9621 Acute and chronic respiratory failure with hypoxia: Secondary | ICD-10-CM | POA: Diagnosis not present

## 2018-12-22 DIAGNOSIS — R600 Localized edema: Secondary | ICD-10-CM | POA: Diagnosis not present

## 2018-12-22 DIAGNOSIS — I252 Old myocardial infarction: Secondary | ICD-10-CM | POA: Diagnosis not present

## 2018-12-22 DIAGNOSIS — I5023 Acute on chronic systolic (congestive) heart failure: Secondary | ICD-10-CM | POA: Diagnosis not present

## 2018-12-22 DIAGNOSIS — R791 Abnormal coagulation profile: Secondary | ICD-10-CM | POA: Diagnosis not present

## 2018-12-22 DIAGNOSIS — I2510 Atherosclerotic heart disease of native coronary artery without angina pectoris: Secondary | ICD-10-CM | POA: Diagnosis not present

## 2018-12-22 DIAGNOSIS — I517 Cardiomegaly: Secondary | ICD-10-CM | POA: Diagnosis not present

## 2018-12-22 DIAGNOSIS — E1165 Type 2 diabetes mellitus with hyperglycemia: Secondary | ICD-10-CM | POA: Diagnosis not present

## 2018-12-23 DIAGNOSIS — J189 Pneumonia, unspecified organism: Secondary | ICD-10-CM | POA: Diagnosis not present

## 2018-12-23 DIAGNOSIS — J90 Pleural effusion, not elsewhere classified: Secondary | ICD-10-CM | POA: Diagnosis not present

## 2018-12-23 DIAGNOSIS — N289 Disorder of kidney and ureter, unspecified: Secondary | ICD-10-CM | POA: Diagnosis not present

## 2018-12-23 DIAGNOSIS — I517 Cardiomegaly: Secondary | ICD-10-CM | POA: Diagnosis not present

## 2018-12-24 DIAGNOSIS — N289 Disorder of kidney and ureter, unspecified: Secondary | ICD-10-CM | POA: Diagnosis not present

## 2018-12-24 DIAGNOSIS — I517 Cardiomegaly: Secondary | ICD-10-CM | POA: Diagnosis not present

## 2018-12-24 DIAGNOSIS — J189 Pneumonia, unspecified organism: Secondary | ICD-10-CM | POA: Diagnosis not present

## 2018-12-24 DIAGNOSIS — J90 Pleural effusion, not elsewhere classified: Secondary | ICD-10-CM | POA: Diagnosis not present

## 2018-12-25 DIAGNOSIS — J189 Pneumonia, unspecified organism: Secondary | ICD-10-CM | POA: Diagnosis not present

## 2018-12-25 DIAGNOSIS — I517 Cardiomegaly: Secondary | ICD-10-CM | POA: Diagnosis not present

## 2018-12-25 DIAGNOSIS — J90 Pleural effusion, not elsewhere classified: Secondary | ICD-10-CM | POA: Diagnosis not present

## 2018-12-25 DIAGNOSIS — N289 Disorder of kidney and ureter, unspecified: Secondary | ICD-10-CM | POA: Diagnosis not present

## 2018-12-26 DIAGNOSIS — J449 Chronic obstructive pulmonary disease, unspecified: Secondary | ICD-10-CM | POA: Diagnosis not present

## 2018-12-26 DIAGNOSIS — N289 Disorder of kidney and ureter, unspecified: Secondary | ICD-10-CM | POA: Diagnosis not present

## 2018-12-26 DIAGNOSIS — J189 Pneumonia, unspecified organism: Secondary | ICD-10-CM | POA: Diagnosis not present

## 2018-12-26 DIAGNOSIS — I517 Cardiomegaly: Secondary | ICD-10-CM | POA: Diagnosis not present

## 2018-12-26 DIAGNOSIS — J90 Pleural effusion, not elsewhere classified: Secondary | ICD-10-CM | POA: Diagnosis not present

## 2018-12-27 DIAGNOSIS — I110 Hypertensive heart disease with heart failure: Secondary | ICD-10-CM | POA: Diagnosis not present

## 2018-12-27 DIAGNOSIS — J9621 Acute and chronic respiratory failure with hypoxia: Secondary | ICD-10-CM | POA: Diagnosis not present

## 2018-12-27 DIAGNOSIS — E876 Hypokalemia: Secondary | ICD-10-CM | POA: Diagnosis not present

## 2018-12-27 DIAGNOSIS — Z79899 Other long term (current) drug therapy: Secondary | ICD-10-CM | POA: Diagnosis not present

## 2018-12-27 DIAGNOSIS — I5023 Acute on chronic systolic (congestive) heart failure: Secondary | ICD-10-CM | POA: Diagnosis not present

## 2018-12-27 DIAGNOSIS — E785 Hyperlipidemia, unspecified: Secondary | ICD-10-CM | POA: Diagnosis not present

## 2018-12-27 DIAGNOSIS — I871 Compression of vein: Secondary | ICD-10-CM | POA: Diagnosis not present

## 2018-12-27 DIAGNOSIS — Z7984 Long term (current) use of oral hypoglycemic drugs: Secondary | ICD-10-CM | POA: Diagnosis not present

## 2018-12-27 DIAGNOSIS — Z7901 Long term (current) use of anticoagulants: Secondary | ICD-10-CM | POA: Diagnosis not present

## 2018-12-27 DIAGNOSIS — I872 Venous insufficiency (chronic) (peripheral): Secondary | ICD-10-CM | POA: Diagnosis not present

## 2018-12-27 DIAGNOSIS — Z87891 Personal history of nicotine dependence: Secondary | ICD-10-CM | POA: Diagnosis not present

## 2018-12-27 DIAGNOSIS — I2510 Atherosclerotic heart disease of native coronary artery without angina pectoris: Secondary | ICD-10-CM | POA: Diagnosis not present

## 2018-12-27 DIAGNOSIS — Z9981 Dependence on supplemental oxygen: Secondary | ICD-10-CM | POA: Diagnosis not present

## 2018-12-27 DIAGNOSIS — E1165 Type 2 diabetes mellitus with hyperglycemia: Secondary | ICD-10-CM | POA: Diagnosis not present

## 2018-12-27 DIAGNOSIS — I252 Old myocardial infarction: Secondary | ICD-10-CM | POA: Diagnosis not present

## 2018-12-27 DIAGNOSIS — J449 Chronic obstructive pulmonary disease, unspecified: Secondary | ICD-10-CM | POA: Diagnosis not present

## 2018-12-27 DIAGNOSIS — Z951 Presence of aortocoronary bypass graft: Secondary | ICD-10-CM | POA: Diagnosis not present

## 2018-12-30 DIAGNOSIS — Z7901 Long term (current) use of anticoagulants: Secondary | ICD-10-CM | POA: Diagnosis not present

## 2018-12-30 DIAGNOSIS — E876 Hypokalemia: Secondary | ICD-10-CM | POA: Diagnosis not present

## 2018-12-30 DIAGNOSIS — I110 Hypertensive heart disease with heart failure: Secondary | ICD-10-CM | POA: Diagnosis not present

## 2018-12-30 DIAGNOSIS — I872 Venous insufficiency (chronic) (peripheral): Secondary | ICD-10-CM | POA: Diagnosis not present

## 2018-12-30 DIAGNOSIS — Z951 Presence of aortocoronary bypass graft: Secondary | ICD-10-CM | POA: Diagnosis not present

## 2018-12-30 DIAGNOSIS — Z7984 Long term (current) use of oral hypoglycemic drugs: Secondary | ICD-10-CM | POA: Diagnosis not present

## 2018-12-30 DIAGNOSIS — I5023 Acute on chronic systolic (congestive) heart failure: Secondary | ICD-10-CM | POA: Diagnosis not present

## 2018-12-30 DIAGNOSIS — E1165 Type 2 diabetes mellitus with hyperglycemia: Secondary | ICD-10-CM | POA: Diagnosis not present

## 2018-12-30 DIAGNOSIS — E785 Hyperlipidemia, unspecified: Secondary | ICD-10-CM | POA: Diagnosis not present

## 2018-12-30 DIAGNOSIS — Z9981 Dependence on supplemental oxygen: Secondary | ICD-10-CM | POA: Diagnosis not present

## 2018-12-30 DIAGNOSIS — Z79899 Other long term (current) drug therapy: Secondary | ICD-10-CM | POA: Diagnosis not present

## 2018-12-30 DIAGNOSIS — I871 Compression of vein: Secondary | ICD-10-CM | POA: Diagnosis not present

## 2018-12-30 DIAGNOSIS — I2510 Atherosclerotic heart disease of native coronary artery without angina pectoris: Secondary | ICD-10-CM | POA: Diagnosis not present

## 2018-12-30 DIAGNOSIS — I252 Old myocardial infarction: Secondary | ICD-10-CM | POA: Diagnosis not present

## 2018-12-30 DIAGNOSIS — J9621 Acute and chronic respiratory failure with hypoxia: Secondary | ICD-10-CM | POA: Diagnosis not present

## 2018-12-30 DIAGNOSIS — Z87891 Personal history of nicotine dependence: Secondary | ICD-10-CM | POA: Diagnosis not present

## 2018-12-31 DIAGNOSIS — I5023 Acute on chronic systolic (congestive) heart failure: Secondary | ICD-10-CM | POA: Diagnosis not present

## 2018-12-31 DIAGNOSIS — J9621 Acute and chronic respiratory failure with hypoxia: Secondary | ICD-10-CM | POA: Diagnosis not present

## 2018-12-31 DIAGNOSIS — E876 Hypokalemia: Secondary | ICD-10-CM | POA: Diagnosis not present

## 2018-12-31 DIAGNOSIS — I2510 Atherosclerotic heart disease of native coronary artery without angina pectoris: Secondary | ICD-10-CM | POA: Diagnosis not present

## 2018-12-31 DIAGNOSIS — I110 Hypertensive heart disease with heart failure: Secondary | ICD-10-CM | POA: Diagnosis not present

## 2018-12-31 DIAGNOSIS — Z87891 Personal history of nicotine dependence: Secondary | ICD-10-CM | POA: Diagnosis not present

## 2018-12-31 DIAGNOSIS — E1165 Type 2 diabetes mellitus with hyperglycemia: Secondary | ICD-10-CM | POA: Diagnosis not present

## 2018-12-31 DIAGNOSIS — Z7984 Long term (current) use of oral hypoglycemic drugs: Secondary | ICD-10-CM | POA: Diagnosis not present

## 2018-12-31 DIAGNOSIS — Z9981 Dependence on supplemental oxygen: Secondary | ICD-10-CM | POA: Diagnosis not present

## 2018-12-31 DIAGNOSIS — Z951 Presence of aortocoronary bypass graft: Secondary | ICD-10-CM | POA: Diagnosis not present

## 2018-12-31 DIAGNOSIS — E785 Hyperlipidemia, unspecified: Secondary | ICD-10-CM | POA: Diagnosis not present

## 2018-12-31 DIAGNOSIS — I871 Compression of vein: Secondary | ICD-10-CM | POA: Diagnosis not present

## 2018-12-31 DIAGNOSIS — Z79899 Other long term (current) drug therapy: Secondary | ICD-10-CM | POA: Diagnosis not present

## 2018-12-31 DIAGNOSIS — I252 Old myocardial infarction: Secondary | ICD-10-CM | POA: Diagnosis not present

## 2018-12-31 DIAGNOSIS — I872 Venous insufficiency (chronic) (peripheral): Secondary | ICD-10-CM | POA: Diagnosis not present

## 2018-12-31 DIAGNOSIS — Z7901 Long term (current) use of anticoagulants: Secondary | ICD-10-CM | POA: Diagnosis not present

## 2019-01-02 DIAGNOSIS — E876 Hypokalemia: Secondary | ICD-10-CM | POA: Diagnosis not present

## 2019-01-02 DIAGNOSIS — E1165 Type 2 diabetes mellitus with hyperglycemia: Secondary | ICD-10-CM | POA: Diagnosis not present

## 2019-01-02 DIAGNOSIS — J9621 Acute and chronic respiratory failure with hypoxia: Secondary | ICD-10-CM | POA: Diagnosis not present

## 2019-01-02 DIAGNOSIS — I480 Paroxysmal atrial fibrillation: Secondary | ICD-10-CM | POA: Diagnosis not present

## 2019-01-02 DIAGNOSIS — Z951 Presence of aortocoronary bypass graft: Secondary | ICD-10-CM | POA: Diagnosis not present

## 2019-01-02 DIAGNOSIS — Z9981 Dependence on supplemental oxygen: Secondary | ICD-10-CM | POA: Diagnosis not present

## 2019-01-02 DIAGNOSIS — Z87891 Personal history of nicotine dependence: Secondary | ICD-10-CM | POA: Diagnosis not present

## 2019-01-02 DIAGNOSIS — E785 Hyperlipidemia, unspecified: Secondary | ICD-10-CM | POA: Diagnosis not present

## 2019-01-02 DIAGNOSIS — I5023 Acute on chronic systolic (congestive) heart failure: Secondary | ICD-10-CM | POA: Diagnosis not present

## 2019-01-02 DIAGNOSIS — Z7901 Long term (current) use of anticoagulants: Secondary | ICD-10-CM | POA: Diagnosis not present

## 2019-01-02 DIAGNOSIS — I5041 Acute combined systolic (congestive) and diastolic (congestive) heart failure: Secondary | ICD-10-CM | POA: Diagnosis not present

## 2019-01-02 DIAGNOSIS — I871 Compression of vein: Secondary | ICD-10-CM | POA: Diagnosis not present

## 2019-01-02 DIAGNOSIS — I2510 Atherosclerotic heart disease of native coronary artery without angina pectoris: Secondary | ICD-10-CM | POA: Diagnosis not present

## 2019-01-02 DIAGNOSIS — I872 Venous insufficiency (chronic) (peripheral): Secondary | ICD-10-CM | POA: Diagnosis not present

## 2019-01-02 DIAGNOSIS — I110 Hypertensive heart disease with heart failure: Secondary | ICD-10-CM | POA: Diagnosis not present

## 2019-01-02 DIAGNOSIS — I252 Old myocardial infarction: Secondary | ICD-10-CM | POA: Diagnosis not present

## 2019-01-02 DIAGNOSIS — Z7984 Long term (current) use of oral hypoglycemic drugs: Secondary | ICD-10-CM | POA: Diagnosis not present

## 2019-01-02 DIAGNOSIS — Z79899 Other long term (current) drug therapy: Secondary | ICD-10-CM | POA: Diagnosis not present

## 2019-01-03 DIAGNOSIS — I110 Hypertensive heart disease with heart failure: Secondary | ICD-10-CM | POA: Diagnosis not present

## 2019-01-03 DIAGNOSIS — I871 Compression of vein: Secondary | ICD-10-CM | POA: Diagnosis not present

## 2019-01-03 DIAGNOSIS — I5023 Acute on chronic systolic (congestive) heart failure: Secondary | ICD-10-CM | POA: Diagnosis not present

## 2019-01-03 DIAGNOSIS — Z9981 Dependence on supplemental oxygen: Secondary | ICD-10-CM | POA: Diagnosis not present

## 2019-01-03 DIAGNOSIS — E1165 Type 2 diabetes mellitus with hyperglycemia: Secondary | ICD-10-CM | POA: Diagnosis not present

## 2019-01-03 DIAGNOSIS — E785 Hyperlipidemia, unspecified: Secondary | ICD-10-CM | POA: Diagnosis not present

## 2019-01-03 DIAGNOSIS — I872 Venous insufficiency (chronic) (peripheral): Secondary | ICD-10-CM | POA: Diagnosis not present

## 2019-01-03 DIAGNOSIS — J9621 Acute and chronic respiratory failure with hypoxia: Secondary | ICD-10-CM | POA: Diagnosis not present

## 2019-01-03 DIAGNOSIS — Z951 Presence of aortocoronary bypass graft: Secondary | ICD-10-CM | POA: Diagnosis not present

## 2019-01-03 DIAGNOSIS — Z7901 Long term (current) use of anticoagulants: Secondary | ICD-10-CM | POA: Diagnosis not present

## 2019-01-03 DIAGNOSIS — I252 Old myocardial infarction: Secondary | ICD-10-CM | POA: Diagnosis not present

## 2019-01-03 DIAGNOSIS — E876 Hypokalemia: Secondary | ICD-10-CM | POA: Diagnosis not present

## 2019-01-03 DIAGNOSIS — Z7984 Long term (current) use of oral hypoglycemic drugs: Secondary | ICD-10-CM | POA: Diagnosis not present

## 2019-01-03 DIAGNOSIS — Z79899 Other long term (current) drug therapy: Secondary | ICD-10-CM | POA: Diagnosis not present

## 2019-01-03 DIAGNOSIS — Z87891 Personal history of nicotine dependence: Secondary | ICD-10-CM | POA: Diagnosis not present

## 2019-01-03 DIAGNOSIS — I2510 Atherosclerotic heart disease of native coronary artery without angina pectoris: Secondary | ICD-10-CM | POA: Diagnosis not present

## 2019-01-06 DIAGNOSIS — I110 Hypertensive heart disease with heart failure: Secondary | ICD-10-CM | POA: Diagnosis not present

## 2019-01-06 DIAGNOSIS — I5023 Acute on chronic systolic (congestive) heart failure: Secondary | ICD-10-CM | POA: Diagnosis not present

## 2019-01-06 DIAGNOSIS — J449 Chronic obstructive pulmonary disease, unspecified: Secondary | ICD-10-CM | POA: Diagnosis not present

## 2019-01-06 DIAGNOSIS — J9621 Acute and chronic respiratory failure with hypoxia: Secondary | ICD-10-CM | POA: Diagnosis not present

## 2019-01-06 DIAGNOSIS — J9611 Chronic respiratory failure with hypoxia: Secondary | ICD-10-CM | POA: Diagnosis not present

## 2019-01-07 DIAGNOSIS — E876 Hypokalemia: Secondary | ICD-10-CM | POA: Diagnosis not present

## 2019-01-07 DIAGNOSIS — Z7984 Long term (current) use of oral hypoglycemic drugs: Secondary | ICD-10-CM | POA: Diagnosis not present

## 2019-01-07 DIAGNOSIS — Z9981 Dependence on supplemental oxygen: Secondary | ICD-10-CM | POA: Diagnosis not present

## 2019-01-07 DIAGNOSIS — I5023 Acute on chronic systolic (congestive) heart failure: Secondary | ICD-10-CM | POA: Diagnosis not present

## 2019-01-07 DIAGNOSIS — E1165 Type 2 diabetes mellitus with hyperglycemia: Secondary | ICD-10-CM | POA: Diagnosis not present

## 2019-01-07 DIAGNOSIS — I2510 Atherosclerotic heart disease of native coronary artery without angina pectoris: Secondary | ICD-10-CM | POA: Diagnosis not present

## 2019-01-07 DIAGNOSIS — I252 Old myocardial infarction: Secondary | ICD-10-CM | POA: Diagnosis not present

## 2019-01-07 DIAGNOSIS — I871 Compression of vein: Secondary | ICD-10-CM | POA: Diagnosis not present

## 2019-01-07 DIAGNOSIS — E785 Hyperlipidemia, unspecified: Secondary | ICD-10-CM | POA: Diagnosis not present

## 2019-01-07 DIAGNOSIS — I110 Hypertensive heart disease with heart failure: Secondary | ICD-10-CM | POA: Diagnosis not present

## 2019-01-07 DIAGNOSIS — Z79899 Other long term (current) drug therapy: Secondary | ICD-10-CM | POA: Diagnosis not present

## 2019-01-07 DIAGNOSIS — J9621 Acute and chronic respiratory failure with hypoxia: Secondary | ICD-10-CM | POA: Diagnosis not present

## 2019-01-07 DIAGNOSIS — Z7901 Long term (current) use of anticoagulants: Secondary | ICD-10-CM | POA: Diagnosis not present

## 2019-01-07 DIAGNOSIS — Z87891 Personal history of nicotine dependence: Secondary | ICD-10-CM | POA: Diagnosis not present

## 2019-01-07 DIAGNOSIS — Z1211 Encounter for screening for malignant neoplasm of colon: Secondary | ICD-10-CM | POA: Diagnosis not present

## 2019-01-07 DIAGNOSIS — Z951 Presence of aortocoronary bypass graft: Secondary | ICD-10-CM | POA: Diagnosis not present

## 2019-01-07 DIAGNOSIS — I872 Venous insufficiency (chronic) (peripheral): Secondary | ICD-10-CM | POA: Diagnosis not present

## 2019-01-09 DIAGNOSIS — Z9981 Dependence on supplemental oxygen: Secondary | ICD-10-CM | POA: Diagnosis not present

## 2019-01-09 DIAGNOSIS — I871 Compression of vein: Secondary | ICD-10-CM | POA: Diagnosis not present

## 2019-01-09 DIAGNOSIS — I252 Old myocardial infarction: Secondary | ICD-10-CM | POA: Diagnosis not present

## 2019-01-09 DIAGNOSIS — I872 Venous insufficiency (chronic) (peripheral): Secondary | ICD-10-CM | POA: Diagnosis not present

## 2019-01-09 DIAGNOSIS — Z7901 Long term (current) use of anticoagulants: Secondary | ICD-10-CM | POA: Diagnosis not present

## 2019-01-09 DIAGNOSIS — Z951 Presence of aortocoronary bypass graft: Secondary | ICD-10-CM | POA: Diagnosis not present

## 2019-01-09 DIAGNOSIS — E785 Hyperlipidemia, unspecified: Secondary | ICD-10-CM | POA: Diagnosis not present

## 2019-01-09 DIAGNOSIS — I5023 Acute on chronic systolic (congestive) heart failure: Secondary | ICD-10-CM | POA: Diagnosis not present

## 2019-01-09 DIAGNOSIS — Z79899 Other long term (current) drug therapy: Secondary | ICD-10-CM | POA: Diagnosis not present

## 2019-01-09 DIAGNOSIS — J9621 Acute and chronic respiratory failure with hypoxia: Secondary | ICD-10-CM | POA: Diagnosis not present

## 2019-01-09 DIAGNOSIS — Z7984 Long term (current) use of oral hypoglycemic drugs: Secondary | ICD-10-CM | POA: Diagnosis not present

## 2019-01-09 DIAGNOSIS — I110 Hypertensive heart disease with heart failure: Secondary | ICD-10-CM | POA: Diagnosis not present

## 2019-01-09 DIAGNOSIS — E1165 Type 2 diabetes mellitus with hyperglycemia: Secondary | ICD-10-CM | POA: Diagnosis not present

## 2019-01-09 DIAGNOSIS — I2510 Atherosclerotic heart disease of native coronary artery without angina pectoris: Secondary | ICD-10-CM | POA: Diagnosis not present

## 2019-01-09 DIAGNOSIS — Z87891 Personal history of nicotine dependence: Secondary | ICD-10-CM | POA: Diagnosis not present

## 2019-01-09 DIAGNOSIS — E876 Hypokalemia: Secondary | ICD-10-CM | POA: Diagnosis not present

## 2019-01-10 DIAGNOSIS — E1165 Type 2 diabetes mellitus with hyperglycemia: Secondary | ICD-10-CM | POA: Diagnosis not present

## 2019-01-10 DIAGNOSIS — J9621 Acute and chronic respiratory failure with hypoxia: Secondary | ICD-10-CM | POA: Diagnosis not present

## 2019-01-10 DIAGNOSIS — Z9981 Dependence on supplemental oxygen: Secondary | ICD-10-CM | POA: Diagnosis not present

## 2019-01-10 DIAGNOSIS — I110 Hypertensive heart disease with heart failure: Secondary | ICD-10-CM | POA: Diagnosis not present

## 2019-01-10 DIAGNOSIS — I872 Venous insufficiency (chronic) (peripheral): Secondary | ICD-10-CM | POA: Diagnosis not present

## 2019-01-10 DIAGNOSIS — Z87891 Personal history of nicotine dependence: Secondary | ICD-10-CM | POA: Diagnosis not present

## 2019-01-10 DIAGNOSIS — I252 Old myocardial infarction: Secondary | ICD-10-CM | POA: Diagnosis not present

## 2019-01-10 DIAGNOSIS — I2510 Atherosclerotic heart disease of native coronary artery without angina pectoris: Secondary | ICD-10-CM | POA: Diagnosis not present

## 2019-01-10 DIAGNOSIS — Z7901 Long term (current) use of anticoagulants: Secondary | ICD-10-CM | POA: Diagnosis not present

## 2019-01-10 DIAGNOSIS — I871 Compression of vein: Secondary | ICD-10-CM | POA: Diagnosis not present

## 2019-01-10 DIAGNOSIS — I5023 Acute on chronic systolic (congestive) heart failure: Secondary | ICD-10-CM | POA: Diagnosis not present

## 2019-01-10 DIAGNOSIS — Z79899 Other long term (current) drug therapy: Secondary | ICD-10-CM | POA: Diagnosis not present

## 2019-01-10 DIAGNOSIS — Z951 Presence of aortocoronary bypass graft: Secondary | ICD-10-CM | POA: Diagnosis not present

## 2019-01-10 DIAGNOSIS — E876 Hypokalemia: Secondary | ICD-10-CM | POA: Diagnosis not present

## 2019-01-10 DIAGNOSIS — E785 Hyperlipidemia, unspecified: Secondary | ICD-10-CM | POA: Diagnosis not present

## 2019-01-10 DIAGNOSIS — Z7984 Long term (current) use of oral hypoglycemic drugs: Secondary | ICD-10-CM | POA: Diagnosis not present

## 2019-01-15 DIAGNOSIS — I110 Hypertensive heart disease with heart failure: Secondary | ICD-10-CM | POA: Diagnosis not present

## 2019-01-15 DIAGNOSIS — J9621 Acute and chronic respiratory failure with hypoxia: Secondary | ICD-10-CM | POA: Diagnosis not present

## 2019-01-15 DIAGNOSIS — I2510 Atherosclerotic heart disease of native coronary artery without angina pectoris: Secondary | ICD-10-CM | POA: Diagnosis not present

## 2019-01-15 DIAGNOSIS — E785 Hyperlipidemia, unspecified: Secondary | ICD-10-CM | POA: Diagnosis not present

## 2019-01-15 DIAGNOSIS — E876 Hypokalemia: Secondary | ICD-10-CM | POA: Diagnosis not present

## 2019-01-15 DIAGNOSIS — I480 Paroxysmal atrial fibrillation: Secondary | ICD-10-CM | POA: Diagnosis not present

## 2019-01-15 DIAGNOSIS — Z7901 Long term (current) use of anticoagulants: Secondary | ICD-10-CM | POA: Diagnosis not present

## 2019-01-15 DIAGNOSIS — I872 Venous insufficiency (chronic) (peripheral): Secondary | ICD-10-CM | POA: Diagnosis not present

## 2019-01-15 DIAGNOSIS — I871 Compression of vein: Secondary | ICD-10-CM | POA: Diagnosis not present

## 2019-01-15 DIAGNOSIS — Z87891 Personal history of nicotine dependence: Secondary | ICD-10-CM | POA: Diagnosis not present

## 2019-01-15 DIAGNOSIS — Z9981 Dependence on supplemental oxygen: Secondary | ICD-10-CM | POA: Diagnosis not present

## 2019-01-15 DIAGNOSIS — Z79899 Other long term (current) drug therapy: Secondary | ICD-10-CM | POA: Diagnosis not present

## 2019-01-15 DIAGNOSIS — I5023 Acute on chronic systolic (congestive) heart failure: Secondary | ICD-10-CM | POA: Diagnosis not present

## 2019-01-15 DIAGNOSIS — I252 Old myocardial infarction: Secondary | ICD-10-CM | POA: Diagnosis not present

## 2019-01-15 DIAGNOSIS — Z7984 Long term (current) use of oral hypoglycemic drugs: Secondary | ICD-10-CM | POA: Diagnosis not present

## 2019-01-15 DIAGNOSIS — E1165 Type 2 diabetes mellitus with hyperglycemia: Secondary | ICD-10-CM | POA: Diagnosis not present

## 2019-01-15 DIAGNOSIS — Z951 Presence of aortocoronary bypass graft: Secondary | ICD-10-CM | POA: Diagnosis not present

## 2019-01-17 DIAGNOSIS — I5023 Acute on chronic systolic (congestive) heart failure: Secondary | ICD-10-CM | POA: Diagnosis not present

## 2019-01-17 DIAGNOSIS — J9621 Acute and chronic respiratory failure with hypoxia: Secondary | ICD-10-CM | POA: Diagnosis not present

## 2019-01-17 DIAGNOSIS — I871 Compression of vein: Secondary | ICD-10-CM | POA: Diagnosis not present

## 2019-01-17 DIAGNOSIS — E876 Hypokalemia: Secondary | ICD-10-CM | POA: Diagnosis not present

## 2019-01-17 DIAGNOSIS — E1165 Type 2 diabetes mellitus with hyperglycemia: Secondary | ICD-10-CM | POA: Diagnosis not present

## 2019-01-17 DIAGNOSIS — Z87891 Personal history of nicotine dependence: Secondary | ICD-10-CM | POA: Diagnosis not present

## 2019-01-17 DIAGNOSIS — Z7901 Long term (current) use of anticoagulants: Secondary | ICD-10-CM | POA: Diagnosis not present

## 2019-01-17 DIAGNOSIS — E785 Hyperlipidemia, unspecified: Secondary | ICD-10-CM | POA: Diagnosis not present

## 2019-01-17 DIAGNOSIS — Z951 Presence of aortocoronary bypass graft: Secondary | ICD-10-CM | POA: Diagnosis not present

## 2019-01-17 DIAGNOSIS — I872 Venous insufficiency (chronic) (peripheral): Secondary | ICD-10-CM | POA: Diagnosis not present

## 2019-01-17 DIAGNOSIS — I252 Old myocardial infarction: Secondary | ICD-10-CM | POA: Diagnosis not present

## 2019-01-17 DIAGNOSIS — Z9981 Dependence on supplemental oxygen: Secondary | ICD-10-CM | POA: Diagnosis not present

## 2019-01-17 DIAGNOSIS — Z7984 Long term (current) use of oral hypoglycemic drugs: Secondary | ICD-10-CM | POA: Diagnosis not present

## 2019-01-17 DIAGNOSIS — I2510 Atherosclerotic heart disease of native coronary artery without angina pectoris: Secondary | ICD-10-CM | POA: Diagnosis not present

## 2019-01-17 DIAGNOSIS — I110 Hypertensive heart disease with heart failure: Secondary | ICD-10-CM | POA: Diagnosis not present

## 2019-01-17 DIAGNOSIS — Z79899 Other long term (current) drug therapy: Secondary | ICD-10-CM | POA: Diagnosis not present

## 2019-01-20 DIAGNOSIS — J449 Chronic obstructive pulmonary disease, unspecified: Secondary | ICD-10-CM | POA: Diagnosis not present

## 2019-01-20 DIAGNOSIS — Z951 Presence of aortocoronary bypass graft: Secondary | ICD-10-CM | POA: Diagnosis not present

## 2019-01-20 DIAGNOSIS — Z9981 Dependence on supplemental oxygen: Secondary | ICD-10-CM | POA: Diagnosis not present

## 2019-01-20 DIAGNOSIS — I5023 Acute on chronic systolic (congestive) heart failure: Secondary | ICD-10-CM | POA: Diagnosis not present

## 2019-01-20 DIAGNOSIS — I2510 Atherosclerotic heart disease of native coronary artery without angina pectoris: Secondary | ICD-10-CM | POA: Diagnosis not present

## 2019-01-20 DIAGNOSIS — E1165 Type 2 diabetes mellitus with hyperglycemia: Secondary | ICD-10-CM | POA: Diagnosis not present

## 2019-01-20 DIAGNOSIS — E785 Hyperlipidemia, unspecified: Secondary | ICD-10-CM | POA: Diagnosis not present

## 2019-01-20 DIAGNOSIS — I871 Compression of vein: Secondary | ICD-10-CM | POA: Diagnosis not present

## 2019-01-20 DIAGNOSIS — E876 Hypokalemia: Secondary | ICD-10-CM | POA: Diagnosis not present

## 2019-01-20 DIAGNOSIS — Z79899 Other long term (current) drug therapy: Secondary | ICD-10-CM | POA: Diagnosis not present

## 2019-01-20 DIAGNOSIS — Z7984 Long term (current) use of oral hypoglycemic drugs: Secondary | ICD-10-CM | POA: Diagnosis not present

## 2019-01-20 DIAGNOSIS — Z87891 Personal history of nicotine dependence: Secondary | ICD-10-CM | POA: Diagnosis not present

## 2019-01-20 DIAGNOSIS — I110 Hypertensive heart disease with heart failure: Secondary | ICD-10-CM | POA: Diagnosis not present

## 2019-01-20 DIAGNOSIS — I872 Venous insufficiency (chronic) (peripheral): Secondary | ICD-10-CM | POA: Diagnosis not present

## 2019-01-20 DIAGNOSIS — Z7901 Long term (current) use of anticoagulants: Secondary | ICD-10-CM | POA: Diagnosis not present

## 2019-01-20 DIAGNOSIS — J9621 Acute and chronic respiratory failure with hypoxia: Secondary | ICD-10-CM | POA: Diagnosis not present

## 2019-01-20 DIAGNOSIS — I252 Old myocardial infarction: Secondary | ICD-10-CM | POA: Diagnosis not present

## 2019-01-21 DIAGNOSIS — Z87891 Personal history of nicotine dependence: Secondary | ICD-10-CM | POA: Diagnosis not present

## 2019-01-21 DIAGNOSIS — Z7984 Long term (current) use of oral hypoglycemic drugs: Secondary | ICD-10-CM | POA: Diagnosis not present

## 2019-01-21 DIAGNOSIS — Z951 Presence of aortocoronary bypass graft: Secondary | ICD-10-CM | POA: Diagnosis not present

## 2019-01-21 DIAGNOSIS — Z7901 Long term (current) use of anticoagulants: Secondary | ICD-10-CM | POA: Diagnosis not present

## 2019-01-21 DIAGNOSIS — I5023 Acute on chronic systolic (congestive) heart failure: Secondary | ICD-10-CM | POA: Diagnosis not present

## 2019-01-21 DIAGNOSIS — E785 Hyperlipidemia, unspecified: Secondary | ICD-10-CM | POA: Diagnosis not present

## 2019-01-21 DIAGNOSIS — Z9981 Dependence on supplemental oxygen: Secondary | ICD-10-CM | POA: Diagnosis not present

## 2019-01-21 DIAGNOSIS — Z79899 Other long term (current) drug therapy: Secondary | ICD-10-CM | POA: Diagnosis not present

## 2019-01-21 DIAGNOSIS — E876 Hypokalemia: Secondary | ICD-10-CM | POA: Diagnosis not present

## 2019-01-21 DIAGNOSIS — E1165 Type 2 diabetes mellitus with hyperglycemia: Secondary | ICD-10-CM | POA: Diagnosis not present

## 2019-01-21 DIAGNOSIS — I110 Hypertensive heart disease with heart failure: Secondary | ICD-10-CM | POA: Diagnosis not present

## 2019-01-21 DIAGNOSIS — I252 Old myocardial infarction: Secondary | ICD-10-CM | POA: Diagnosis not present

## 2019-01-21 DIAGNOSIS — I871 Compression of vein: Secondary | ICD-10-CM | POA: Diagnosis not present

## 2019-01-21 DIAGNOSIS — I872 Venous insufficiency (chronic) (peripheral): Secondary | ICD-10-CM | POA: Diagnosis not present

## 2019-01-21 DIAGNOSIS — J9621 Acute and chronic respiratory failure with hypoxia: Secondary | ICD-10-CM | POA: Diagnosis not present

## 2019-01-21 DIAGNOSIS — I2510 Atherosclerotic heart disease of native coronary artery without angina pectoris: Secondary | ICD-10-CM | POA: Diagnosis not present

## 2019-01-22 DIAGNOSIS — I2510 Atherosclerotic heart disease of native coronary artery without angina pectoris: Secondary | ICD-10-CM | POA: Diagnosis not present

## 2019-01-22 DIAGNOSIS — I871 Compression of vein: Secondary | ICD-10-CM | POA: Diagnosis not present

## 2019-01-22 DIAGNOSIS — E1165 Type 2 diabetes mellitus with hyperglycemia: Secondary | ICD-10-CM | POA: Diagnosis not present

## 2019-01-22 DIAGNOSIS — J9621 Acute and chronic respiratory failure with hypoxia: Secondary | ICD-10-CM | POA: Diagnosis not present

## 2019-01-22 DIAGNOSIS — Z87891 Personal history of nicotine dependence: Secondary | ICD-10-CM | POA: Diagnosis not present

## 2019-01-22 DIAGNOSIS — Z7901 Long term (current) use of anticoagulants: Secondary | ICD-10-CM | POA: Diagnosis not present

## 2019-01-22 DIAGNOSIS — Z79899 Other long term (current) drug therapy: Secondary | ICD-10-CM | POA: Diagnosis not present

## 2019-01-22 DIAGNOSIS — Z951 Presence of aortocoronary bypass graft: Secondary | ICD-10-CM | POA: Diagnosis not present

## 2019-01-22 DIAGNOSIS — I252 Old myocardial infarction: Secondary | ICD-10-CM | POA: Diagnosis not present

## 2019-01-22 DIAGNOSIS — Z9981 Dependence on supplemental oxygen: Secondary | ICD-10-CM | POA: Diagnosis not present

## 2019-01-22 DIAGNOSIS — I5023 Acute on chronic systolic (congestive) heart failure: Secondary | ICD-10-CM | POA: Diagnosis not present

## 2019-01-22 DIAGNOSIS — Z7984 Long term (current) use of oral hypoglycemic drugs: Secondary | ICD-10-CM | POA: Diagnosis not present

## 2019-01-22 DIAGNOSIS — E785 Hyperlipidemia, unspecified: Secondary | ICD-10-CM | POA: Diagnosis not present

## 2019-01-22 DIAGNOSIS — E876 Hypokalemia: Secondary | ICD-10-CM | POA: Diagnosis not present

## 2019-01-22 DIAGNOSIS — I110 Hypertensive heart disease with heart failure: Secondary | ICD-10-CM | POA: Diagnosis not present

## 2019-01-22 DIAGNOSIS — I872 Venous insufficiency (chronic) (peripheral): Secondary | ICD-10-CM | POA: Diagnosis not present

## 2019-01-23 DIAGNOSIS — Z5181 Encounter for therapeutic drug level monitoring: Secondary | ICD-10-CM | POA: Diagnosis not present

## 2019-01-23 DIAGNOSIS — I480 Paroxysmal atrial fibrillation: Secondary | ICD-10-CM | POA: Diagnosis not present

## 2019-01-23 DIAGNOSIS — Z7901 Long term (current) use of anticoagulants: Secondary | ICD-10-CM | POA: Diagnosis not present

## 2019-01-27 DIAGNOSIS — Z79899 Other long term (current) drug therapy: Secondary | ICD-10-CM | POA: Diagnosis not present

## 2019-01-27 DIAGNOSIS — Z87891 Personal history of nicotine dependence: Secondary | ICD-10-CM | POA: Diagnosis not present

## 2019-01-27 DIAGNOSIS — I2510 Atherosclerotic heart disease of native coronary artery without angina pectoris: Secondary | ICD-10-CM | POA: Diagnosis not present

## 2019-01-27 DIAGNOSIS — I5023 Acute on chronic systolic (congestive) heart failure: Secondary | ICD-10-CM | POA: Diagnosis not present

## 2019-01-27 DIAGNOSIS — Z7984 Long term (current) use of oral hypoglycemic drugs: Secondary | ICD-10-CM | POA: Diagnosis not present

## 2019-01-27 DIAGNOSIS — Z951 Presence of aortocoronary bypass graft: Secondary | ICD-10-CM | POA: Diagnosis not present

## 2019-01-27 DIAGNOSIS — E785 Hyperlipidemia, unspecified: Secondary | ICD-10-CM | POA: Diagnosis not present

## 2019-01-27 DIAGNOSIS — I871 Compression of vein: Secondary | ICD-10-CM | POA: Diagnosis not present

## 2019-01-27 DIAGNOSIS — E876 Hypokalemia: Secondary | ICD-10-CM | POA: Diagnosis not present

## 2019-01-27 DIAGNOSIS — I872 Venous insufficiency (chronic) (peripheral): Secondary | ICD-10-CM | POA: Diagnosis not present

## 2019-01-27 DIAGNOSIS — Z9981 Dependence on supplemental oxygen: Secondary | ICD-10-CM | POA: Diagnosis not present

## 2019-01-27 DIAGNOSIS — I110 Hypertensive heart disease with heart failure: Secondary | ICD-10-CM | POA: Diagnosis not present

## 2019-01-27 DIAGNOSIS — E1165 Type 2 diabetes mellitus with hyperglycemia: Secondary | ICD-10-CM | POA: Diagnosis not present

## 2019-01-27 DIAGNOSIS — Z7901 Long term (current) use of anticoagulants: Secondary | ICD-10-CM | POA: Diagnosis not present

## 2019-01-27 DIAGNOSIS — J449 Chronic obstructive pulmonary disease, unspecified: Secondary | ICD-10-CM | POA: Diagnosis not present

## 2019-01-27 DIAGNOSIS — I252 Old myocardial infarction: Secondary | ICD-10-CM | POA: Diagnosis not present

## 2019-01-27 DIAGNOSIS — J9621 Acute and chronic respiratory failure with hypoxia: Secondary | ICD-10-CM | POA: Diagnosis not present

## 2019-01-28 DIAGNOSIS — Z87891 Personal history of nicotine dependence: Secondary | ICD-10-CM | POA: Diagnosis not present

## 2019-01-28 DIAGNOSIS — E785 Hyperlipidemia, unspecified: Secondary | ICD-10-CM | POA: Diagnosis not present

## 2019-01-28 DIAGNOSIS — J9621 Acute and chronic respiratory failure with hypoxia: Secondary | ICD-10-CM | POA: Diagnosis not present

## 2019-01-28 DIAGNOSIS — Z79899 Other long term (current) drug therapy: Secondary | ICD-10-CM | POA: Diagnosis not present

## 2019-01-28 DIAGNOSIS — I872 Venous insufficiency (chronic) (peripheral): Secondary | ICD-10-CM | POA: Diagnosis not present

## 2019-01-28 DIAGNOSIS — I252 Old myocardial infarction: Secondary | ICD-10-CM | POA: Diagnosis not present

## 2019-01-28 DIAGNOSIS — E1165 Type 2 diabetes mellitus with hyperglycemia: Secondary | ICD-10-CM | POA: Diagnosis not present

## 2019-01-28 DIAGNOSIS — I5023 Acute on chronic systolic (congestive) heart failure: Secondary | ICD-10-CM | POA: Diagnosis not present

## 2019-01-28 DIAGNOSIS — Z9981 Dependence on supplemental oxygen: Secondary | ICD-10-CM | POA: Diagnosis not present

## 2019-01-28 DIAGNOSIS — E876 Hypokalemia: Secondary | ICD-10-CM | POA: Diagnosis not present

## 2019-01-28 DIAGNOSIS — Z951 Presence of aortocoronary bypass graft: Secondary | ICD-10-CM | POA: Diagnosis not present

## 2019-01-28 DIAGNOSIS — I871 Compression of vein: Secondary | ICD-10-CM | POA: Diagnosis not present

## 2019-01-28 DIAGNOSIS — I2510 Atherosclerotic heart disease of native coronary artery without angina pectoris: Secondary | ICD-10-CM | POA: Diagnosis not present

## 2019-01-28 DIAGNOSIS — Z7984 Long term (current) use of oral hypoglycemic drugs: Secondary | ICD-10-CM | POA: Diagnosis not present

## 2019-01-28 DIAGNOSIS — Z7901 Long term (current) use of anticoagulants: Secondary | ICD-10-CM | POA: Diagnosis not present

## 2019-01-28 DIAGNOSIS — I110 Hypertensive heart disease with heart failure: Secondary | ICD-10-CM | POA: Diagnosis not present

## 2019-01-31 DIAGNOSIS — I2510 Atherosclerotic heart disease of native coronary artery without angina pectoris: Secondary | ICD-10-CM | POA: Diagnosis not present

## 2019-01-31 DIAGNOSIS — Z79899 Other long term (current) drug therapy: Secondary | ICD-10-CM | POA: Diagnosis not present

## 2019-01-31 DIAGNOSIS — Z7984 Long term (current) use of oral hypoglycemic drugs: Secondary | ICD-10-CM | POA: Diagnosis not present

## 2019-01-31 DIAGNOSIS — J9621 Acute and chronic respiratory failure with hypoxia: Secondary | ICD-10-CM | POA: Diagnosis not present

## 2019-01-31 DIAGNOSIS — I252 Old myocardial infarction: Secondary | ICD-10-CM | POA: Diagnosis not present

## 2019-01-31 DIAGNOSIS — E785 Hyperlipidemia, unspecified: Secondary | ICD-10-CM | POA: Diagnosis not present

## 2019-01-31 DIAGNOSIS — E1165 Type 2 diabetes mellitus with hyperglycemia: Secondary | ICD-10-CM | POA: Diagnosis not present

## 2019-01-31 DIAGNOSIS — Z7901 Long term (current) use of anticoagulants: Secondary | ICD-10-CM | POA: Diagnosis not present

## 2019-01-31 DIAGNOSIS — Z9981 Dependence on supplemental oxygen: Secondary | ICD-10-CM | POA: Diagnosis not present

## 2019-01-31 DIAGNOSIS — Z951 Presence of aortocoronary bypass graft: Secondary | ICD-10-CM | POA: Diagnosis not present

## 2019-01-31 DIAGNOSIS — Z87891 Personal history of nicotine dependence: Secondary | ICD-10-CM | POA: Diagnosis not present

## 2019-01-31 DIAGNOSIS — E876 Hypokalemia: Secondary | ICD-10-CM | POA: Diagnosis not present

## 2019-01-31 DIAGNOSIS — I872 Venous insufficiency (chronic) (peripheral): Secondary | ICD-10-CM | POA: Diagnosis not present

## 2019-01-31 DIAGNOSIS — I5023 Acute on chronic systolic (congestive) heart failure: Secondary | ICD-10-CM | POA: Diagnosis not present

## 2019-01-31 DIAGNOSIS — I871 Compression of vein: Secondary | ICD-10-CM | POA: Diagnosis not present

## 2019-01-31 DIAGNOSIS — I110 Hypertensive heart disease with heart failure: Secondary | ICD-10-CM | POA: Diagnosis not present

## 2019-02-03 DIAGNOSIS — E1169 Type 2 diabetes mellitus with other specified complication: Secondary | ICD-10-CM | POA: Diagnosis not present

## 2019-02-03 DIAGNOSIS — I5041 Acute combined systolic (congestive) and diastolic (congestive) heart failure: Secondary | ICD-10-CM | POA: Diagnosis not present

## 2019-02-03 DIAGNOSIS — Z7901 Long term (current) use of anticoagulants: Secondary | ICD-10-CM | POA: Diagnosis not present

## 2019-02-04 DIAGNOSIS — I872 Venous insufficiency (chronic) (peripheral): Secondary | ICD-10-CM | POA: Diagnosis not present

## 2019-02-04 DIAGNOSIS — Z79899 Other long term (current) drug therapy: Secondary | ICD-10-CM | POA: Diagnosis not present

## 2019-02-04 DIAGNOSIS — I110 Hypertensive heart disease with heart failure: Secondary | ICD-10-CM | POA: Diagnosis not present

## 2019-02-04 DIAGNOSIS — E876 Hypokalemia: Secondary | ICD-10-CM | POA: Diagnosis not present

## 2019-02-04 DIAGNOSIS — Z5181 Encounter for therapeutic drug level monitoring: Secondary | ICD-10-CM | POA: Diagnosis not present

## 2019-02-04 DIAGNOSIS — I2510 Atherosclerotic heart disease of native coronary artery without angina pectoris: Secondary | ICD-10-CM | POA: Diagnosis not present

## 2019-02-04 DIAGNOSIS — I480 Paroxysmal atrial fibrillation: Secondary | ICD-10-CM | POA: Diagnosis not present

## 2019-02-04 DIAGNOSIS — I252 Old myocardial infarction: Secondary | ICD-10-CM | POA: Diagnosis not present

## 2019-02-04 DIAGNOSIS — Z9981 Dependence on supplemental oxygen: Secondary | ICD-10-CM | POA: Diagnosis not present

## 2019-02-04 DIAGNOSIS — I871 Compression of vein: Secondary | ICD-10-CM | POA: Diagnosis not present

## 2019-02-04 DIAGNOSIS — J9621 Acute and chronic respiratory failure with hypoxia: Secondary | ICD-10-CM | POA: Diagnosis not present

## 2019-02-04 DIAGNOSIS — Z7984 Long term (current) use of oral hypoglycemic drugs: Secondary | ICD-10-CM | POA: Diagnosis not present

## 2019-02-04 DIAGNOSIS — I5023 Acute on chronic systolic (congestive) heart failure: Secondary | ICD-10-CM | POA: Diagnosis not present

## 2019-02-04 DIAGNOSIS — Z7901 Long term (current) use of anticoagulants: Secondary | ICD-10-CM | POA: Diagnosis not present

## 2019-02-04 DIAGNOSIS — Z87891 Personal history of nicotine dependence: Secondary | ICD-10-CM | POA: Diagnosis not present

## 2019-02-04 DIAGNOSIS — Z951 Presence of aortocoronary bypass graft: Secondary | ICD-10-CM | POA: Diagnosis not present

## 2019-02-04 DIAGNOSIS — E785 Hyperlipidemia, unspecified: Secondary | ICD-10-CM | POA: Diagnosis not present

## 2019-02-04 DIAGNOSIS — E1165 Type 2 diabetes mellitus with hyperglycemia: Secondary | ICD-10-CM | POA: Diagnosis not present

## 2019-02-05 DIAGNOSIS — J449 Chronic obstructive pulmonary disease, unspecified: Secondary | ICD-10-CM | POA: Diagnosis not present

## 2019-02-05 DIAGNOSIS — J9611 Chronic respiratory failure with hypoxia: Secondary | ICD-10-CM | POA: Diagnosis not present

## 2019-02-12 ENCOUNTER — Other Ambulatory Visit: Payer: Self-pay

## 2019-02-12 DIAGNOSIS — I872 Venous insufficiency (chronic) (peripheral): Secondary | ICD-10-CM | POA: Diagnosis not present

## 2019-02-12 DIAGNOSIS — Z951 Presence of aortocoronary bypass graft: Secondary | ICD-10-CM | POA: Diagnosis not present

## 2019-02-12 DIAGNOSIS — I5023 Acute on chronic systolic (congestive) heart failure: Secondary | ICD-10-CM | POA: Diagnosis not present

## 2019-02-12 DIAGNOSIS — E1165 Type 2 diabetes mellitus with hyperglycemia: Secondary | ICD-10-CM | POA: Diagnosis not present

## 2019-02-12 DIAGNOSIS — I452 Bifascicular block: Secondary | ICD-10-CM | POA: Diagnosis not present

## 2019-02-12 DIAGNOSIS — Z955 Presence of coronary angioplasty implant and graft: Secondary | ICD-10-CM | POA: Diagnosis not present

## 2019-02-12 DIAGNOSIS — I480 Paroxysmal atrial fibrillation: Secondary | ICD-10-CM | POA: Diagnosis not present

## 2019-02-12 DIAGNOSIS — I10 Essential (primary) hypertension: Secondary | ICD-10-CM | POA: Diagnosis not present

## 2019-02-12 DIAGNOSIS — Z87891 Personal history of nicotine dependence: Secondary | ICD-10-CM | POA: Diagnosis not present

## 2019-02-12 DIAGNOSIS — E785 Hyperlipidemia, unspecified: Secondary | ICD-10-CM | POA: Diagnosis not present

## 2019-02-12 DIAGNOSIS — I110 Hypertensive heart disease with heart failure: Secondary | ICD-10-CM | POA: Diagnosis not present

## 2019-02-12 DIAGNOSIS — Z9981 Dependence on supplemental oxygen: Secondary | ICD-10-CM | POA: Diagnosis not present

## 2019-02-12 DIAGNOSIS — I871 Compression of vein: Secondary | ICD-10-CM | POA: Diagnosis not present

## 2019-02-12 DIAGNOSIS — Z7984 Long term (current) use of oral hypoglycemic drugs: Secondary | ICD-10-CM | POA: Diagnosis not present

## 2019-02-12 DIAGNOSIS — Z7901 Long term (current) use of anticoagulants: Secondary | ICD-10-CM | POA: Diagnosis not present

## 2019-02-12 DIAGNOSIS — J9621 Acute and chronic respiratory failure with hypoxia: Secondary | ICD-10-CM | POA: Diagnosis not present

## 2019-02-12 DIAGNOSIS — I252 Old myocardial infarction: Secondary | ICD-10-CM | POA: Diagnosis not present

## 2019-02-12 DIAGNOSIS — E876 Hypokalemia: Secondary | ICD-10-CM | POA: Diagnosis not present

## 2019-02-12 DIAGNOSIS — I2510 Atherosclerotic heart disease of native coronary artery without angina pectoris: Secondary | ICD-10-CM | POA: Diagnosis not present

## 2019-02-12 DIAGNOSIS — Z79899 Other long term (current) drug therapy: Secondary | ICD-10-CM | POA: Diagnosis not present

## 2019-02-12 NOTE — Patient Outreach (Signed)
[place] [facility]) Care Management  02/12/2019  Steven Hester 1945-06-06 [id number]   Medication Adherence call to Mr. Steven Hester Steven Hester Verify spoke with patient he is past due on Janument Xr 100/1000 mg patient explain he is no longer taking this medication doctor took him off. Mr. Steven Hester is showing past due under [place].   Steven Hester Management Direct Dial [phone]  Fax [phone] [email or web address]

## 2019-02-17 ENCOUNTER — Encounter: Payer: Self-pay | Admitting: Pulmonary Disease

## 2019-02-17 DIAGNOSIS — J9611 Chronic respiratory failure with hypoxia: Secondary | ICD-10-CM | POA: Insufficient documentation

## 2019-02-17 DIAGNOSIS — R0602 Shortness of breath: Secondary | ICD-10-CM | POA: Insufficient documentation

## 2019-02-19 ENCOUNTER — Telehealth: Payer: Self-pay

## 2019-02-19 DIAGNOSIS — G4733 Obstructive sleep apnea (adult) (pediatric): Secondary | ICD-10-CM | POA: Diagnosis not present

## 2019-02-19 DIAGNOSIS — R0602 Shortness of breath: Secondary | ICD-10-CM

## 2019-02-19 NOTE — Telephone Encounter (Signed)
-----   Message from Ilona Sorrel sent at 02/18/2019 12:06 PM EDT ----- Regarding: FW: dx code for [place] can you change dx on original order or do you need to put in a new order?    Judeen Hammans ----- Message ----- From: Garner Nash, DO Sent: 02/17/2019   4:27 PM EDT To: Patsi Sears, LPN Subject: RE: dx code for CT                             J96.11 R06.02  Thanks  Leory Plowman  ----- Message ----- From: Ilona Sorrel Sent: 02/17/2019   4:18 PM EDT To: Garner Nash, DO, Vivia Ewing, LPN Subject: dx code for CT                                 You put in order on 8/13 for pt to have CT at first of November at [facility].  I have pt scheduled but per scheduler computer is kicking out dx code Z87.01 (h/o pneumonia) for CT Chest without contrast.  New dx code needed.   Judeen Hammans

## 2019-02-20 DIAGNOSIS — I871 Compression of vein: Secondary | ICD-10-CM | POA: Diagnosis not present

## 2019-02-20 DIAGNOSIS — I252 Old myocardial infarction: Secondary | ICD-10-CM | POA: Diagnosis not present

## 2019-02-20 DIAGNOSIS — I110 Hypertensive heart disease with heart failure: Secondary | ICD-10-CM | POA: Diagnosis not present

## 2019-02-20 DIAGNOSIS — Z9981 Dependence on supplemental oxygen: Secondary | ICD-10-CM | POA: Diagnosis not present

## 2019-02-20 DIAGNOSIS — E1165 Type 2 diabetes mellitus with hyperglycemia: Secondary | ICD-10-CM | POA: Diagnosis not present

## 2019-02-20 DIAGNOSIS — E785 Hyperlipidemia, unspecified: Secondary | ICD-10-CM | POA: Diagnosis not present

## 2019-02-20 DIAGNOSIS — Z951 Presence of aortocoronary bypass graft: Secondary | ICD-10-CM | POA: Diagnosis not present

## 2019-02-20 DIAGNOSIS — Z7901 Long term (current) use of anticoagulants: Secondary | ICD-10-CM | POA: Diagnosis not present

## 2019-02-20 DIAGNOSIS — Z87891 Personal history of nicotine dependence: Secondary | ICD-10-CM | POA: Diagnosis not present

## 2019-02-20 DIAGNOSIS — Z79899 Other long term (current) drug therapy: Secondary | ICD-10-CM | POA: Diagnosis not present

## 2019-02-20 DIAGNOSIS — I5023 Acute on chronic systolic (congestive) heart failure: Secondary | ICD-10-CM | POA: Diagnosis not present

## 2019-02-20 DIAGNOSIS — Z7984 Long term (current) use of oral hypoglycemic drugs: Secondary | ICD-10-CM | POA: Diagnosis not present

## 2019-02-20 DIAGNOSIS — E876 Hypokalemia: Secondary | ICD-10-CM | POA: Diagnosis not present

## 2019-02-20 DIAGNOSIS — I2510 Atherosclerotic heart disease of native coronary artery without angina pectoris: Secondary | ICD-10-CM | POA: Diagnosis not present

## 2019-02-20 DIAGNOSIS — I872 Venous insufficiency (chronic) (peripheral): Secondary | ICD-10-CM | POA: Diagnosis not present

## 2019-02-20 DIAGNOSIS — J9621 Acute and chronic respiratory failure with hypoxia: Secondary | ICD-10-CM | POA: Diagnosis not present

## 2019-02-26 DIAGNOSIS — J449 Chronic obstructive pulmonary disease, unspecified: Secondary | ICD-10-CM | POA: Diagnosis not present

## 2019-03-05 DIAGNOSIS — I480 Paroxysmal atrial fibrillation: Secondary | ICD-10-CM | POA: Diagnosis not present

## 2019-03-05 DIAGNOSIS — I4819 Other persistent atrial fibrillation: Secondary | ICD-10-CM | POA: Diagnosis not present

## 2019-03-06 DIAGNOSIS — E1140 Type 2 diabetes mellitus with diabetic neuropathy, unspecified: Secondary | ICD-10-CM | POA: Diagnosis not present

## 2019-03-06 DIAGNOSIS — J9611 Chronic respiratory failure with hypoxia: Secondary | ICD-10-CM | POA: Diagnosis not present

## 2019-03-08 DIAGNOSIS — J449 Chronic obstructive pulmonary disease, unspecified: Secondary | ICD-10-CM | POA: Diagnosis not present

## 2019-03-08 DIAGNOSIS — J9611 Chronic respiratory failure with hypoxia: Secondary | ICD-10-CM | POA: Diagnosis not present

## 2019-03-10 DIAGNOSIS — J90 Pleural effusion, not elsewhere classified: Secondary | ICD-10-CM | POA: Diagnosis not present

## 2019-03-10 DIAGNOSIS — J189 Pneumonia, unspecified organism: Secondary | ICD-10-CM | POA: Diagnosis not present

## 2019-03-10 DIAGNOSIS — R0602 Shortness of breath: Secondary | ICD-10-CM | POA: Diagnosis not present

## 2019-03-21 ENCOUNTER — Telehealth: Payer: Self-pay

## 2019-03-21 NOTE — Telephone Encounter (Signed)
Dr Valeta Harms received results from [facility]. He result the CT as follow: Please let pt know that his CT chest is stable and lymph nodes decreased in size. Pt understood and verbalized understanding. Nothing further is needed.

## 2019-03-29 DIAGNOSIS — J449 Chronic obstructive pulmonary disease, unspecified: Secondary | ICD-10-CM | POA: Diagnosis not present

## 2019-04-01 DIAGNOSIS — I4819 Other persistent atrial fibrillation: Secondary | ICD-10-CM | POA: Diagnosis not present

## 2019-04-06 DIAGNOSIS — J9611 Chronic respiratory failure with hypoxia: Secondary | ICD-10-CM | POA: Diagnosis not present

## 2019-04-06 DIAGNOSIS — E1140 Type 2 diabetes mellitus with diabetic neuropathy, unspecified: Secondary | ICD-10-CM | POA: Diagnosis not present

## 2019-04-07 DIAGNOSIS — J449 Chronic obstructive pulmonary disease, unspecified: Secondary | ICD-10-CM | POA: Diagnosis not present

## 2019-04-07 DIAGNOSIS — J9611 Chronic respiratory failure with hypoxia: Secondary | ICD-10-CM | POA: Diagnosis not present

## 2019-04-21 DIAGNOSIS — E1169 Type 2 diabetes mellitus with other specified complication: Secondary | ICD-10-CM | POA: Diagnosis not present

## 2019-04-21 DIAGNOSIS — E782 Mixed hyperlipidemia: Secondary | ICD-10-CM | POA: Diagnosis not present

## 2019-04-21 DIAGNOSIS — E1122 Type 2 diabetes mellitus with diabetic chronic kidney disease: Secondary | ICD-10-CM | POA: Diagnosis not present

## 2019-04-21 DIAGNOSIS — I482 Chronic atrial fibrillation, unspecified: Secondary | ICD-10-CM | POA: Diagnosis not present

## 2019-04-21 DIAGNOSIS — I10 Essential (primary) hypertension: Secondary | ICD-10-CM | POA: Diagnosis not present

## 2019-04-23 DIAGNOSIS — E782 Mixed hyperlipidemia: Secondary | ICD-10-CM | POA: Diagnosis not present

## 2019-04-23 DIAGNOSIS — I10 Essential (primary) hypertension: Secondary | ICD-10-CM | POA: Diagnosis not present

## 2019-04-23 DIAGNOSIS — E1122 Type 2 diabetes mellitus with diabetic chronic kidney disease: Secondary | ICD-10-CM | POA: Diagnosis not present

## 2019-04-28 DIAGNOSIS — I4819 Other persistent atrial fibrillation: Secondary | ICD-10-CM | POA: Diagnosis not present

## 2019-04-28 DIAGNOSIS — J449 Chronic obstructive pulmonary disease, unspecified: Secondary | ICD-10-CM | POA: Diagnosis not present

## 2019-05-06 DIAGNOSIS — J9611 Chronic respiratory failure with hypoxia: Secondary | ICD-10-CM | POA: Diagnosis not present

## 2019-05-06 DIAGNOSIS — E1140 Type 2 diabetes mellitus with diabetic neuropathy, unspecified: Secondary | ICD-10-CM | POA: Diagnosis not present

## 2019-05-08 DIAGNOSIS — J9611 Chronic respiratory failure with hypoxia: Secondary | ICD-10-CM | POA: Diagnosis not present

## 2019-05-08 DIAGNOSIS — J449 Chronic obstructive pulmonary disease, unspecified: Secondary | ICD-10-CM | POA: Diagnosis not present

## 2019-05-16 DIAGNOSIS — R001 Bradycardia, unspecified: Secondary | ICD-10-CM | POA: Insufficient documentation

## 2019-05-16 DIAGNOSIS — I10 Essential (primary) hypertension: Secondary | ICD-10-CM | POA: Diagnosis not present

## 2019-05-16 DIAGNOSIS — I480 Paroxysmal atrial fibrillation: Secondary | ICD-10-CM | POA: Diagnosis not present

## 2019-05-16 DIAGNOSIS — I452 Bifascicular block: Secondary | ICD-10-CM | POA: Diagnosis not present

## 2019-05-16 DIAGNOSIS — R5381 Other malaise: Secondary | ICD-10-CM | POA: Insufficient documentation

## 2019-05-16 DIAGNOSIS — I2510 Atherosclerotic heart disease of native coronary artery without angina pectoris: Secondary | ICD-10-CM | POA: Diagnosis not present

## 2019-05-16 DIAGNOSIS — I252 Old myocardial infarction: Secondary | ICD-10-CM | POA: Diagnosis not present

## 2019-05-16 DIAGNOSIS — R5383 Other fatigue: Secondary | ICD-10-CM | POA: Insufficient documentation

## 2019-05-16 DIAGNOSIS — I4819 Other persistent atrial fibrillation: Secondary | ICD-10-CM | POA: Diagnosis not present

## 2019-05-22 DIAGNOSIS — G4733 Obstructive sleep apnea (adult) (pediatric): Secondary | ICD-10-CM | POA: Diagnosis not present

## 2019-05-29 DIAGNOSIS — J449 Chronic obstructive pulmonary disease, unspecified: Secondary | ICD-10-CM | POA: Diagnosis not present

## 2019-06-03 DIAGNOSIS — Z7901 Long term (current) use of anticoagulants: Secondary | ICD-10-CM | POA: Diagnosis not present

## 2019-06-04 DIAGNOSIS — I480 Paroxysmal atrial fibrillation: Secondary | ICD-10-CM | POA: Diagnosis not present

## 2019-06-05 DIAGNOSIS — I454 Nonspecific intraventricular block: Secondary | ICD-10-CM | POA: Diagnosis not present

## 2019-06-05 DIAGNOSIS — I493 Ventricular premature depolarization: Secondary | ICD-10-CM | POA: Diagnosis not present

## 2019-06-05 DIAGNOSIS — I491 Atrial premature depolarization: Secondary | ICD-10-CM | POA: Diagnosis not present

## 2019-06-05 DIAGNOSIS — I440 Atrioventricular block, first degree: Secondary | ICD-10-CM | POA: Diagnosis not present

## 2019-06-06 DIAGNOSIS — J9611 Chronic respiratory failure with hypoxia: Secondary | ICD-10-CM | POA: Diagnosis not present

## 2019-06-06 DIAGNOSIS — E1140 Type 2 diabetes mellitus with diabetic neuropathy, unspecified: Secondary | ICD-10-CM | POA: Diagnosis not present

## 2019-06-08 DIAGNOSIS — J9611 Chronic respiratory failure with hypoxia: Secondary | ICD-10-CM | POA: Diagnosis not present

## 2019-06-08 DIAGNOSIS — J449 Chronic obstructive pulmonary disease, unspecified: Secondary | ICD-10-CM | POA: Diagnosis not present

## 2019-06-16 ENCOUNTER — Other Ambulatory Visit: Payer: Self-pay | Admitting: Legal Medicine

## 2019-06-24 DIAGNOSIS — I4819 Other persistent atrial fibrillation: Secondary | ICD-10-CM | POA: Diagnosis not present

## 2019-06-29 DIAGNOSIS — J449 Chronic obstructive pulmonary disease, unspecified: Secondary | ICD-10-CM | POA: Diagnosis not present

## 2019-06-30 DIAGNOSIS — I480 Paroxysmal atrial fibrillation: Secondary | ICD-10-CM | POA: Diagnosis not present

## 2019-06-30 DIAGNOSIS — I2510 Atherosclerotic heart disease of native coronary artery without angina pectoris: Secondary | ICD-10-CM | POA: Diagnosis not present

## 2019-06-30 DIAGNOSIS — I452 Bifascicular block: Secondary | ICD-10-CM | POA: Diagnosis not present

## 2019-06-30 DIAGNOSIS — I10 Essential (primary) hypertension: Secondary | ICD-10-CM | POA: Diagnosis not present

## 2019-06-30 DIAGNOSIS — I252 Old myocardial infarction: Secondary | ICD-10-CM | POA: Diagnosis not present

## 2019-07-02 DIAGNOSIS — I4510 Unspecified right bundle-branch block: Secondary | ICD-10-CM | POA: Diagnosis not present

## 2019-07-02 DIAGNOSIS — I440 Atrioventricular block, first degree: Secondary | ICD-10-CM | POA: Diagnosis not present

## 2019-07-02 DIAGNOSIS — I10 Essential (primary) hypertension: Secondary | ICD-10-CM | POA: Diagnosis not present

## 2019-07-02 DIAGNOSIS — I480 Paroxysmal atrial fibrillation: Secondary | ICD-10-CM | POA: Diagnosis not present

## 2019-07-06 DIAGNOSIS — J9611 Chronic respiratory failure with hypoxia: Secondary | ICD-10-CM | POA: Diagnosis not present

## 2019-07-06 DIAGNOSIS — E1140 Type 2 diabetes mellitus with diabetic neuropathy, unspecified: Secondary | ICD-10-CM | POA: Diagnosis not present

## 2019-07-07 DIAGNOSIS — R001 Bradycardia, unspecified: Secondary | ICD-10-CM | POA: Diagnosis not present

## 2019-07-07 DIAGNOSIS — I480 Paroxysmal atrial fibrillation: Secondary | ICD-10-CM | POA: Diagnosis not present

## 2019-07-07 DIAGNOSIS — I452 Bifascicular block: Secondary | ICD-10-CM | POA: Diagnosis not present

## 2019-07-22 DIAGNOSIS — I4819 Other persistent atrial fibrillation: Secondary | ICD-10-CM | POA: Diagnosis not present

## 2019-07-23 DIAGNOSIS — Z01812 Encounter for preprocedural laboratory examination: Secondary | ICD-10-CM | POA: Diagnosis not present

## 2019-07-23 DIAGNOSIS — I480 Paroxysmal atrial fibrillation: Secondary | ICD-10-CM | POA: Diagnosis not present

## 2019-07-24 DIAGNOSIS — Z01812 Encounter for preprocedural laboratory examination: Secondary | ICD-10-CM | POA: Diagnosis not present

## 2019-07-24 DIAGNOSIS — I480 Paroxysmal atrial fibrillation: Secondary | ICD-10-CM | POA: Diagnosis not present

## 2019-07-27 DIAGNOSIS — J449 Chronic obstructive pulmonary disease, unspecified: Secondary | ICD-10-CM | POA: Diagnosis not present

## 2019-07-29 DIAGNOSIS — Z950 Presence of cardiac pacemaker: Secondary | ICD-10-CM | POA: Diagnosis not present

## 2019-07-29 DIAGNOSIS — I517 Cardiomegaly: Secondary | ICD-10-CM | POA: Diagnosis not present

## 2019-07-29 DIAGNOSIS — Z955 Presence of coronary angioplasty implant and graft: Secondary | ICD-10-CM | POA: Diagnosis not present

## 2019-07-29 DIAGNOSIS — J90 Pleural effusion, not elsewhere classified: Secondary | ICD-10-CM | POA: Diagnosis not present

## 2019-07-29 DIAGNOSIS — G4733 Obstructive sleep apnea (adult) (pediatric): Secondary | ICD-10-CM | POA: Diagnosis not present

## 2019-07-29 DIAGNOSIS — I4510 Unspecified right bundle-branch block: Secondary | ICD-10-CM | POA: Diagnosis not present

## 2019-07-29 DIAGNOSIS — N183 Chronic kidney disease, stage 3 unspecified: Secondary | ICD-10-CM | POA: Diagnosis not present

## 2019-07-29 DIAGNOSIS — I313 Pericardial effusion (noninflammatory): Secondary | ICD-10-CM | POA: Diagnosis not present

## 2019-07-29 DIAGNOSIS — E785 Hyperlipidemia, unspecified: Secondary | ICD-10-CM | POA: Diagnosis not present

## 2019-07-29 DIAGNOSIS — I252 Old myocardial infarction: Secondary | ICD-10-CM | POA: Diagnosis not present

## 2019-07-29 DIAGNOSIS — I2510 Atherosclerotic heart disease of native coronary artery without angina pectoris: Secondary | ICD-10-CM | POA: Diagnosis not present

## 2019-07-29 DIAGNOSIS — I440 Atrioventricular block, first degree: Secondary | ICD-10-CM | POA: Diagnosis not present

## 2019-07-29 DIAGNOSIS — I480 Paroxysmal atrial fibrillation: Secondary | ICD-10-CM | POA: Diagnosis not present

## 2019-07-29 DIAGNOSIS — R0789 Other chest pain: Secondary | ICD-10-CM | POA: Diagnosis not present

## 2019-07-29 DIAGNOSIS — R918 Other nonspecific abnormal finding of lung field: Secondary | ICD-10-CM | POA: Diagnosis not present

## 2019-07-29 DIAGNOSIS — Z7901 Long term (current) use of anticoagulants: Secondary | ICD-10-CM | POA: Diagnosis not present

## 2019-07-29 DIAGNOSIS — Z79899 Other long term (current) drug therapy: Secondary | ICD-10-CM | POA: Diagnosis not present

## 2019-07-29 DIAGNOSIS — I495 Sick sinus syndrome: Secondary | ICD-10-CM | POA: Diagnosis not present

## 2019-07-29 DIAGNOSIS — Z87891 Personal history of nicotine dependence: Secondary | ICD-10-CM | POA: Diagnosis not present

## 2019-07-29 DIAGNOSIS — I129 Hypertensive chronic kidney disease with stage 1 through stage 4 chronic kidney disease, or unspecified chronic kidney disease: Secondary | ICD-10-CM | POA: Diagnosis not present

## 2019-07-29 DIAGNOSIS — I314 Cardiac tamponade: Secondary | ICD-10-CM | POA: Diagnosis not present

## 2019-07-29 DIAGNOSIS — I97190 Other postprocedural cardiac functional disturbances following cardiac surgery: Secondary | ICD-10-CM | POA: Diagnosis not present

## 2019-07-29 DIAGNOSIS — E1122 Type 2 diabetes mellitus with diabetic chronic kidney disease: Secondary | ICD-10-CM | POA: Diagnosis not present

## 2019-07-29 DIAGNOSIS — Z7984 Long term (current) use of oral hypoglycemic drugs: Secondary | ICD-10-CM | POA: Diagnosis not present

## 2019-08-04 DIAGNOSIS — J9611 Chronic respiratory failure with hypoxia: Secondary | ICD-10-CM | POA: Diagnosis not present

## 2019-08-04 DIAGNOSIS — E1140 Type 2 diabetes mellitus with diabetic neuropathy, unspecified: Secondary | ICD-10-CM | POA: Diagnosis not present

## 2019-08-06 DIAGNOSIS — E1140 Type 2 diabetes mellitus with diabetic neuropathy, unspecified: Secondary | ICD-10-CM | POA: Diagnosis not present

## 2019-08-06 DIAGNOSIS — J9611 Chronic respiratory failure with hypoxia: Secondary | ICD-10-CM | POA: Diagnosis not present

## 2019-08-14 DIAGNOSIS — Z950 Presence of cardiac pacemaker: Secondary | ICD-10-CM | POA: Diagnosis not present

## 2019-08-14 DIAGNOSIS — R5383 Other fatigue: Secondary | ICD-10-CM | POA: Diagnosis not present

## 2019-08-14 DIAGNOSIS — Z45018 Encounter for adjustment and management of other part of cardiac pacemaker: Secondary | ICD-10-CM | POA: Diagnosis not present

## 2019-08-14 DIAGNOSIS — R001 Bradycardia, unspecified: Secondary | ICD-10-CM | POA: Diagnosis not present

## 2019-08-14 DIAGNOSIS — R5381 Other malaise: Secondary | ICD-10-CM | POA: Diagnosis not present

## 2019-08-14 DIAGNOSIS — R0602 Shortness of breath: Secondary | ICD-10-CM | POA: Diagnosis not present

## 2019-08-14 DIAGNOSIS — Z8679 Personal history of other diseases of the circulatory system: Secondary | ICD-10-CM | POA: Diagnosis not present

## 2019-08-14 DIAGNOSIS — R000 Tachycardia, unspecified: Secondary | ICD-10-CM | POA: Diagnosis not present

## 2019-08-14 DIAGNOSIS — I480 Paroxysmal atrial fibrillation: Secondary | ICD-10-CM | POA: Diagnosis not present

## 2019-08-21 DIAGNOSIS — G4733 Obstructive sleep apnea (adult) (pediatric): Secondary | ICD-10-CM | POA: Diagnosis not present

## 2019-08-22 DIAGNOSIS — I129 Hypertensive chronic kidney disease with stage 1 through stage 4 chronic kidney disease, or unspecified chronic kidney disease: Secondary | ICD-10-CM | POA: Diagnosis not present

## 2019-08-22 DIAGNOSIS — E876 Hypokalemia: Secondary | ICD-10-CM | POA: Insufficient documentation

## 2019-08-22 DIAGNOSIS — E1122 Type 2 diabetes mellitus with diabetic chronic kidney disease: Secondary | ICD-10-CM | POA: Diagnosis not present

## 2019-08-22 DIAGNOSIS — N1832 Chronic kidney disease, stage 3b: Secondary | ICD-10-CM | POA: Diagnosis not present

## 2019-08-22 DIAGNOSIS — I4819 Other persistent atrial fibrillation: Secondary | ICD-10-CM | POA: Diagnosis not present

## 2019-08-22 DIAGNOSIS — N179 Acute kidney failure, unspecified: Secondary | ICD-10-CM | POA: Diagnosis not present

## 2019-08-25 ENCOUNTER — Other Ambulatory Visit: Payer: Self-pay | Admitting: Legal Medicine

## 2019-08-25 DIAGNOSIS — N179 Acute kidney failure, unspecified: Secondary | ICD-10-CM | POA: Diagnosis not present

## 2019-08-25 DIAGNOSIS — N183 Chronic kidney disease, stage 3 unspecified: Secondary | ICD-10-CM | POA: Diagnosis not present

## 2019-08-25 DIAGNOSIS — N1832 Chronic kidney disease, stage 3b: Secondary | ICD-10-CM | POA: Diagnosis not present

## 2019-08-27 ENCOUNTER — Ambulatory Visit (INDEPENDENT_AMBULATORY_CARE_PROVIDER_SITE_OTHER): Payer: Medicare Other | Admitting: Legal Medicine

## 2019-08-27 ENCOUNTER — Encounter: Payer: Self-pay | Admitting: Legal Medicine

## 2019-08-27 ENCOUNTER — Other Ambulatory Visit: Payer: Self-pay

## 2019-08-27 VITALS — BP 130/70 | HR 86 | Temp 97.8°F | Resp 16 | Ht 73.0 in | Wt 285.0 lb

## 2019-08-27 DIAGNOSIS — I482 Chronic atrial fibrillation, unspecified: Secondary | ICD-10-CM

## 2019-08-27 DIAGNOSIS — I10 Essential (primary) hypertension: Secondary | ICD-10-CM | POA: Insufficient documentation

## 2019-08-27 DIAGNOSIS — D6949 Other primary thrombocytopenia: Secondary | ICD-10-CM | POA: Insufficient documentation

## 2019-08-27 DIAGNOSIS — I5032 Chronic diastolic (congestive) heart failure: Secondary | ICD-10-CM

## 2019-08-27 DIAGNOSIS — J449 Chronic obstructive pulmonary disease, unspecified: Secondary | ICD-10-CM | POA: Diagnosis not present

## 2019-08-27 DIAGNOSIS — E782 Mixed hyperlipidemia: Secondary | ICD-10-CM

## 2019-08-27 DIAGNOSIS — E1169 Type 2 diabetes mellitus with other specified complication: Secondary | ICD-10-CM | POA: Diagnosis not present

## 2019-08-27 DIAGNOSIS — E1151 Type 2 diabetes mellitus with diabetic peripheral angiopathy without gangrene: Secondary | ICD-10-CM | POA: Insufficient documentation

## 2019-08-27 HISTORY — DX: Other primary thrombocytopenia: D69.49

## 2019-08-27 NOTE — Assessment & Plan Note (Signed)
Patient has Chronic atrial fibrillation controlled with diltiazem and amiodarone.  He is on chronic warfarin.  He is followed by cardiology

## 2019-08-27 NOTE — Assessment & Plan Note (Signed)
AN INDIVIDUAL CARE PLAN for thrombocytopenia was established and reinforced today.  The patient's status was assessed using clinical findings on exam, labs, and other diagnostic testing. Patient's success at meeting treatment goals based on disease specific evidence-bassed guidelines and found to be in good control. RECOMMENDATIONS include maintaining present medicines and treatment.

## 2019-08-27 NOTE — Assessment & Plan Note (Signed)

## 2019-08-27 NOTE — Assessment & Plan Note (Signed)
An individualized care plan was established and reinforced for heart failure.  The patient's disease status was assessed using clinical finding son exam today, labs, and/or other diagnostic testing such as x-rays, to determine the patient's success in meeting treatmentgoalsbased on disease-based guidelines and found to beimproving. But not at goal yet. Medications prescriptions no change Laboratory tests ordered to be performed today include bnp. RECOMMENDATIONS: given include continue to see cardiology .  Call physician is patient gains 3 lbs in one day or 5 lbs for one week.  Call for progressive PND, orthopnea or increased pedal edema.

## 2019-08-27 NOTE — Assessment & Plan Note (Signed)
An individual care plan for diabetes was established and reinforced today.  The patient's status was assessed using clinical findings on exam, labs and diagnostic testing. Patient success at meeting goals based on disease specific evidence-based guidelines and found to be good controlled. Medications were assessed and patient's understanding of the medical issues , including barriers were assessed. Recommend adherence to a diabetic diet, a graduated exercise program, HgbA1c level is checked quarterly, and urine microalbumin performed yearly .  Annual mono-filament sensation testing performed. Lower blood pressure and control hyperlipidemia is important. Get annual eye exams and annual flu shots and smoking cessation discussed.  Self management goals were discussed. 

## 2019-08-27 NOTE — Assessment & Plan Note (Signed)

## 2019-08-27 NOTE — Progress Notes (Signed)
Established Patient Office Visit  Subjective:  Patient ID: Steven Hester, male    DOB: July 16, 1945  Age: 74 y.o. MRN: [id number]  CC:  Chief Complaint  Patient presents with  . Diabetes  . Hyperlipidemia  . Hypertension  . Congestive Heart Failure    HPI Steven Hester presents for Chronic visit.  Patient presents with diastolic  that is stable. Diagnosis made 2020.  The course of the disease is stable.  Current medicines include diltiazem and amiodarone. Patient follows a low cholesterol diet and maintains a weight diary.  Patient is on low salt, low cholesterol diet and avoids alcohol.  Patient denies adverse effects of medicines. Patient is monitoring weight and has no change of weight of weight.  Patient is having no pedal edema, no PND and no PND.  Patient is continuing to see cardiology.  Patient present with type 2 diabetes.  Specifically, this is type 2, nonislulin requiring diabetes, complicated by hyertensio and hyercholesterolemia.  Compliance with treatment has been good; patient take medicines as directed, maintains diet and exercise regimen, follows up as directed, and is keeping glucose diary.  Date of  diagnosis 2010.  Depression screen has been performed.Tobacco screen nonsoker. Current medicines for diabetes diet.  Patient is on none for renal protection and atorvastatin for cholesterol control.  Patient performs foot exams daily and last ophthalmologic exam was next month.  Patient presents for follow up of hypertension.  Patient tolerating diltiazem, lasix well with side effects.  Patient was diagnosed with hypertension 2010 so has been treated for hypertension for 10 years.Patient is working on maintaining diet and exercise regimen and follows up as directed. Complication include atrial fibrillation.  Patient presents with hyperlipidemia.  Compliance with treatment has been good; patient takes medicines as directed, maintains low cholesterol diet, follows up as  directed, and maintains exercise regimen.  Patient is using atorvastatin without problems.  Past Medical History:  Diagnosis Date  . Atrial fibrillation ([place])   . CAD (coronary artery disease)   . Chronic atrial fibrillation ([place])   . DM II (diabetes mellitus, type II), controlled ([place])   . Essential hypertension   . HLD (hyperlipidemia)   . MI (myocardial infarction) ([place])   . Mixed hyperlipidemia   . Other primary thrombocytopenia ([place]) 08/27/2019  . Type 2 diabetes mellitus with other specified complication [facility])     Past Surgical History:  Procedure Laterality Date  . CORONARY ANGIOPLASTY WITH STENT PLACEMENT      Family History  Problem Relation Age of Onset  . Heart attack Mother   . GI Disease Father   . Cancer Father     Social History   Socioeconomic History  . Marital status: Married    Spouse name: Not on file  . Number of children: Not on file  . Years of education: Not on file  . Highest education level: Not on file  Occupational History  . Not on file  Tobacco Use  . Smoking status: Former Smoker    Packs/day: 1.00    Years: 10.00    Pack years: 10.00    Types: Cigarettes  . Smokeless tobacco: Never Used  Substance and Sexual Activity  . Alcohol use: Never  . Drug use: Never  . Sexual activity: Not on file  Other Topics Concern  . Not on file  Social History Narrative  . Not on file   Social Determinants of Health   Financial Resource Strain:   . Difficulty of Paying Living  Expenses:   Food Insecurity:   . Worried About Charity fundraiser in the Last Year:   . Arboriculturist in the Last Year:   Transportation Needs:   . Film/video editor (Medical):   Marland Kitchen Lack of Transportation (Non-Medical):   Physical Activity:   . Days of Exercise per Week:   . Minutes of Exercise per Session:   Stress:   . Feeling of Stress :   Social Connections:   . Frequency of Communication with Friends and Family:   . Frequency of Social Gatherings with  Friends and Family:   . Attends Religious Services:   . Active Member of Clubs or Organizations:   . Attends Archivist Meetings:   Marland Kitchen Marital Status:   Intimate Partner Violence:   . Fear of Current or Ex-Partner:   . Emotionally Abused:   Marland Kitchen Physically Abused:   . Sexually Abused:     Outpatient Medications Prior to Visit  Medication Sig Dispense Refill  . nitroGLYCERIN (NITROSTAT) 0.4 MG SL tablet Place under the tongue.    Marland Kitchen amiodarone (PACERONE) 200 MG tablet Take 200 mg by mouth daily.    Marland Kitchen amLODipine-olmesartan (AZOR) 10-40 MG tablet Take 1 tablet by mouth once daily 90 tablet 2  . atorvastatin (LIPITOR) 80 MG tablet Take 80 mg by mouth daily.    . cyanocobalamin 1000 MCG tablet Take by mouth.    . diclofenac Sodium (VOLTAREN) 1 % GEL Apply topically.    Marland Kitchen diltiazem (CARDIZEM CD) 120 MG 24 hr capsule Take 120 mg by mouth daily.    . fenofibrate (TRICOR) 145 MG tablet Take 145 mg by mouth daily.    . Flaxseed, Linseed, (FLAX SEED OIL) 1000 MG CAPS Take by mouth.    . furosemide (LASIX) 40 MG tablet     . potassium chloride (MICRO-K) 10 MEQ CR capsule Take 1 capsule (10 mEq total) by mouth 2 (two) times daily. 60 capsule 3  . Sennosides 25 MG TABS Take by mouth.    . warfarin (COUMADIN) 5 MG tablet Take 5 mg by mouth daily.    . ciclopirox (PENLAC) 8 % solution Apply topically at bedtime. Apply over nail and surrounding skin. Apply daily over previous coat. Remove weekly with file or polish remover. 6.6 mL 0  . nystatin (NYSTATIN) powder Apply topically 4 (four) times daily.    . pravastatin (PRAVACHOL) 80 MG tablet Take 80 mg by mouth daily.    . sitaGLIPtin-metformin (JANUMET) 50-1000 MG tablet Take 1 tablet by mouth 2 (two) times daily with a meal.    . umeclidinium-vilanterol (ANORO ELLIPTA) 62.5-25 MCG/INH AEPB Inhale 1 puff into the lungs daily.     No facility-administered medications prior to visit.    Allergies  Allergen Reactions  . Ezetimibe Other (See  Comments)    Zetia-unknown reaction   . Neopap [Acetaminophen]   . Niacin Rash    ROS Review of Systems  Constitutional: Negative.   HENT: Negative.   Eyes: Negative.   Respiratory: Negative.   Cardiovascular: Negative.   Gastrointestinal: Negative.   Endocrine: Negative.   Genitourinary: Negative.   Musculoskeletal: Positive for arthralgias.  Skin: Negative.   Neurological: Negative.   Hematological: Negative.   Psychiatric/Behavioral: Negative.       Objective:    Physical Exam  Constitutional: He is oriented to person, place, and time. He appears well-developed and well-nourished.  HENT:  Head: Normocephalic and atraumatic.  Right Ear: External ear normal.  Left Ear: External ear normal.  Nose: Nose normal.  Mouth/Throat: Oropharynx is clear and moist.  Eyes: Pupils are equal, round, and reactive to light. Conjunctivae and EOM are normal.  Cardiovascular: Normal rate, regular rhythm, normal heart sounds and intact distal pulses.  Pulmonary/Chest: Effort normal and breath sounds normal.  Abdominal: Soft. Bowel sounds are normal.  Musculoskeletal:        General: Normal range of motion.     Cervical back: Normal range of motion and neck supple.  Neurological: He is alert and oriented to person, place, and time. He has normal reflexes.  Skin: Skin is warm and dry.  Psychiatric: He has a normal mood and affect. His behavior is normal.  Vitals reviewed.   BP 130/70   Pulse 86   Temp 97.8 F (36.6 C)   Resp 16   Ht 6' 1"  (1.854 m)   Wt 285 lb (129.3 kg)   SpO2 95%   BMI 37.60 kg/m  Wt Readings from Last 3 Encounters:  08/27/19 285 lb (129.3 kg)  12/19/18 (!) 354 lb (160.6 kg)  10/29/17 (!) 350 lb (158.8 kg)     Health Maintenance Due  Topic Date Due  . HEMOGLOBIN A1C  Never done  . Hepatitis C Screening  Never done  . OPHTHALMOLOGY EXAM  Never done  . COVID-19 Vaccine (1) Never done  . TETANUS/TDAP  Never done  . COLONOSCOPY  Never done  . PNA vac  Low Risk Adult (1 of 2 - PCV13) Never done    There are no preventive care reminders to display for this patient.  No results found for: TSH No results found for: WBC, HGB, HCT, MCV, PLT No results found for: NA, K, CHLORIDE, CO2, GLUCOSE, BUN, CREATININE, BILITOT, ALKPHOS, AST, ALT, PROT, ALBUMIN, CALCIUM, ANIONGAP, EGFR, GFR No results found for: CHOL No results found for: HDL No results found for: LDLCALC No results found for: TRIG No results found for: CHOLHDL No results found for: HGBA1C    Assessment & Plan:   Problem List Items Addressed This Visit      Cardiovascular and Mediastinum   Essential hypertension    An individual hypertension care plan was established and reinforced today.  The patient's status was assessed using clinical findings on exam and labs or diagnostic tests. The patient's success at meeting treatment goals on disease specific evidence-based guidelines and found to be well controlled. SELF MANAGEMENT: The patient and I together assessed ways to personally work towards obtaining the recommended goals. RECOMMENDATIONS: avoid decongestants found in common cold remedies, decrease consumption of alcohol, perform routine monitoring of BP with home BP cuff, exercise, reduction of dietary salt, take medicines as prescribed, try not to miss doses and quit smoking.  Regular exercise and maintaining a healthy weight is needed.  Stress reduction may help. A CLINICAL SUMMARY including written plan identify barriers to care unique to individual due to social or financial issues.  We attempt to mutually creat solutions for individual and family understanding.      Relevant Medications   atorvastatin (LIPITOR) 80 MG tablet   nitroGLYCERIN (NITROSTAT) 0.4 MG SL tablet   Other Relevant Orders   CBC with Differential   Comprehensive metabolic panel   Chronic atrial fibrillation ([place])    Patient has Chronic atrial fibrillation controlled with diltiazem and amiodarone.  He  is on chronic warfarin.  He is followed by cardiology      Relevant Medications   atorvastatin (LIPITOR) 80 MG tablet  nitroGLYCERIN (NITROSTAT) 0.4 MG SL tablet   Chronic diastolic heart failure ([place]) - Primary    An individualized care plan was established and reinforced for heart failure.  The patient's disease status was assessed using clinical finding son exam today, labs, and/or other diagnostic testing such as x-rays, to determine the patient's success in meeting treatmentgoalsbased on disease-based guidelines and found to beimproving. But not at goal yet. Medications prescriptions no change Laboratory tests ordered to be performed today include bnp. RECOMMENDATIONS: given include continue to see cardiology .  Call physician is patient gains 3 lbs in one day or 5 lbs for one week.  Call for progressive PND, orthopnea or increased pedal edema.      Relevant Medications   atorvastatin (LIPITOR) 80 MG tablet   nitroGLYCERIN (NITROSTAT) 0.4 MG SL tablet   Other Relevant Orders   Brain natriuretic peptide     Endocrine   Type 2 diabetes mellitus with other specified complication [facility])    An individual care plan for diabetes was established and reinforced today.  The patient's status was assessed using clinical findings on exam, labs and diagnostic testing. Patient success at meeting goals based on disease specific evidence-based guidelines and found to be good controlled. Medications were assessed and patient's understanding of the medical issues , including barriers were assessed. Recommend adherence to a diabetic diet, a graduated exercise program, HgbA1c level is checked quarterly, and urine microalbumin performed yearly .  Annual mono-filament sensation testing performed. Lower blood pressure and control hyperlipidemia is important. Get annual eye exams and annual flu shots and smoking cessation discussed.  Self management goals were discussed.      Relevant Medications    atorvastatin (LIPITOR) 80 MG tablet   Other Relevant Orders   Hemoglobin A1c     Hematopoietic and Hemostatic   Other primary thrombocytopenia (HCC)    AN INDIVIDUAL CARE PLAN for thrombocytopenia was established and reinforced today.  The patient's status was assessed using clinical findings on exam, labs, and other diagnostic testing. Patient's success at meeting treatment goals based on disease specific evidence-bassed guidelines and found to be in good control. RECOMMENDATIONS include maintaining present medicines and treatment.        Other   Mixed hyperlipidemia    AN INDIVIDUAL CARE PLAN for hyperlipidemia/ cholesterol was established and reinforced today.  The patient's status was assessed using clinical findings on exam, lab and other diagnostic tests. The patient's disease status was assessed based on evidence-based guidelines and found to be well controlled. MEDICATIONS were reviewed. SELF MANAGEMENT GOALS have been discussed and patient's success at attaining the goal of low cholesterol was assessed. RECOMMENDATION given include regular exercise 3 days a week and low cholesterol/low fat diet. CLINICAL SUMMARY including written plan to identify barriers unique to the patient due to social or economic  reasons was discussed.      Relevant Medications   atorvastatin (LIPITOR) 80 MG tablet   nitroGLYCERIN (NITROSTAT) 0.4 MG SL tablet   Other Relevant Orders   Lipid Panel      No orders of the defined types were placed in this encounter.   Follow-up: Return in about 4 months (around 12/27/2019) for fasting.    Reinaldo Meeker, MD

## 2019-08-28 LAB — COMPREHENSIVE METABOLIC PANEL
ALT: 16 IU/L (ref 0–44)
AST: 28 IU/L (ref 0–40)
Albumin/Globulin Ratio: 1.6 (ref 1.2–2.2)
Albumin: 4.4 g/dL (ref 3.7–4.7)
Alkaline Phosphatase: 84 IU/L (ref 39–117)
BUN/Creatinine Ratio: 13 (ref 10–24)
BUN: 22 mg/dL (ref 8–27)
Bilirubin Total: 0.4 mg/dL (ref 0.0–1.2)
CO2: 22 mmol/L (ref 20–29)
Calcium: 9.3 mg/dL (ref 8.6–10.2)
Chloride: 106 mmol/L (ref 96–106)
Creatinine, Ser: 1.7 mg/dL — ABNORMAL HIGH (ref 0.76–1.27)
GFR calc Af Amer: 45 mL/min/{1.73_m2} — ABNORMAL LOW (ref >59)
GFR calc non Af Amer: 39 mL/min/{1.73_m2} — ABNORMAL LOW (ref >59)
Globulin, Total: 2.8 g/dL (ref 1.5–4.5)
Glucose: 101 mg/dL — ABNORMAL HIGH (ref 65–99)
Potassium: 3.9 mmol/L (ref 3.5–5.2)
Sodium: 145 mmol/L — ABNORMAL HIGH (ref 134–144)
Total Protein: 7.2 g/dL (ref 6.0–8.5)

## 2019-08-28 LAB — CBC WITH DIFFERENTIAL/PLATELET
Basophils Absolute: 0 10*3/uL (ref 0.0–0.2)
Basos: 1 %
EOS (ABSOLUTE): 0.1 10*3/uL (ref 0.0–0.4)
Eos: 2 %
Hematocrit: 39.4 % (ref 37.5–51.0)
Hemoglobin: 12.8 g/dL — ABNORMAL LOW (ref 13.0–17.7)
Immature Grans (Abs): 0 10*3/uL (ref 0.0–0.1)
Immature Granulocytes: 0 %
Lymphocytes Absolute: 2.2 10*3/uL (ref 0.7–3.1)
Lymphs: 34 %
MCH: 29.5 pg (ref 26.6–33.0)
MCHC: 32.5 g/dL (ref 31.5–35.7)
MCV: 91 fL (ref 79–97)
Monocytes Absolute: 0.4 10*3/uL (ref 0.1–0.9)
Monocytes: 7 %
Neutrophils Absolute: 3.6 10*3/uL (ref 1.4–7.0)
Neutrophils: 56 %
Platelets: 148 10*3/uL — ABNORMAL LOW (ref 150–450)
RBC: 4.34 x10E6/uL (ref 4.14–5.80)
RDW: 14.5 % (ref 11.6–15.4)
WBC: 6.4 10*3/uL (ref 3.4–10.8)

## 2019-08-28 LAB — HEMOGLOBIN A1C
Est. average glucose Bld gHb Est-mCnc: 117 mg/dL
Hgb A1c MFr Bld: 5.7 % — ABNORMAL HIGH (ref 4.8–5.6)

## 2019-08-28 LAB — BRAIN NATRIURETIC PEPTIDE: BNP: 299.4 pg/mL — ABNORMAL HIGH (ref 0.0–100.0)

## 2019-08-28 LAB — LIPID PANEL
Chol/HDL Ratio: 3.1 ratio (ref 0.0–5.0)
Cholesterol, Total: 141 mg/dL (ref 100–199)
HDL: 45 mg/dL (ref >39)
LDL Chol Calc (NIH): 78 mg/dL (ref 0–99)
Triglycerides: 94 mg/dL (ref 0–149)
VLDL Cholesterol Cal: 18 mg/dL (ref 5–40)

## 2019-08-28 LAB — CARDIOVASCULAR RISK ASSESSMENT

## 2019-08-28 NOTE — Progress Notes (Signed)
Hemoglobin low, platelets low, glucose 101, kidney tests stage 3a, Liver tests normal, Cholesterol normal, A1c 57, BNP 299- mild failure lp

## 2019-09-04 DIAGNOSIS — E1140 Type 2 diabetes mellitus with diabetic neuropathy, unspecified: Secondary | ICD-10-CM | POA: Diagnosis not present

## 2019-09-04 DIAGNOSIS — J9611 Chronic respiratory failure with hypoxia: Secondary | ICD-10-CM | POA: Diagnosis not present

## 2019-09-05 DIAGNOSIS — E1140 Type 2 diabetes mellitus with diabetic neuropathy, unspecified: Secondary | ICD-10-CM | POA: Diagnosis not present

## 2019-09-05 DIAGNOSIS — J9611 Chronic respiratory failure with hypoxia: Secondary | ICD-10-CM | POA: Diagnosis not present

## 2019-09-16 ENCOUNTER — Other Ambulatory Visit: Payer: Self-pay

## 2019-09-16 MED ORDER — ATORVASTATIN CALCIUM 80 MG PO TABS: 80.0000 mg | ORAL_TABLET | Freq: Every day | ORAL | 1 refills | Status: DC

## 2019-09-17 ENCOUNTER — Other Ambulatory Visit: Payer: Self-pay | Admitting: Legal Medicine

## 2019-09-26 DIAGNOSIS — J449 Chronic obstructive pulmonary disease, unspecified: Secondary | ICD-10-CM | POA: Diagnosis not present

## 2019-10-02 DIAGNOSIS — I480 Paroxysmal atrial fibrillation: Secondary | ICD-10-CM | POA: Diagnosis not present

## 2019-10-04 DIAGNOSIS — J9611 Chronic respiratory failure with hypoxia: Secondary | ICD-10-CM | POA: Diagnosis not present

## 2019-10-04 DIAGNOSIS — E1140 Type 2 diabetes mellitus with diabetic neuropathy, unspecified: Secondary | ICD-10-CM | POA: Diagnosis not present

## 2019-10-06 DIAGNOSIS — J9611 Chronic respiratory failure with hypoxia: Secondary | ICD-10-CM | POA: Diagnosis not present

## 2019-10-06 DIAGNOSIS — E1140 Type 2 diabetes mellitus with diabetic neuropathy, unspecified: Secondary | ICD-10-CM | POA: Diagnosis not present

## 2019-10-27 DIAGNOSIS — J449 Chronic obstructive pulmonary disease, unspecified: Secondary | ICD-10-CM | POA: Diagnosis not present

## 2019-10-28 DIAGNOSIS — I480 Paroxysmal atrial fibrillation: Secondary | ICD-10-CM | POA: Diagnosis not present

## 2019-10-31 DIAGNOSIS — H26492 Other secondary cataract, left eye: Secondary | ICD-10-CM | POA: Diagnosis not present

## 2019-11-04 DIAGNOSIS — J9611 Chronic respiratory failure with hypoxia: Secondary | ICD-10-CM | POA: Diagnosis not present

## 2019-11-04 DIAGNOSIS — E1140 Type 2 diabetes mellitus with diabetic neuropathy, unspecified: Secondary | ICD-10-CM | POA: Diagnosis not present

## 2019-11-05 DIAGNOSIS — J9611 Chronic respiratory failure with hypoxia: Secondary | ICD-10-CM | POA: Diagnosis not present

## 2019-11-05 DIAGNOSIS — E1140 Type 2 diabetes mellitus with diabetic neuropathy, unspecified: Secondary | ICD-10-CM | POA: Diagnosis not present

## 2019-11-19 ENCOUNTER — Encounter: Payer: Self-pay | Admitting: Legal Medicine

## 2019-11-19 ENCOUNTER — Ambulatory Visit (INDEPENDENT_AMBULATORY_CARE_PROVIDER_SITE_OTHER): Payer: Medicare Other | Admitting: Legal Medicine

## 2019-11-19 ENCOUNTER — Other Ambulatory Visit: Payer: Self-pay

## 2019-11-19 VITALS — BP 122/60 | HR 84 | Temp 97.7°F | Resp 16 | Ht 73.0 in | Wt 290.8 lb

## 2019-11-19 DIAGNOSIS — J9611 Chronic respiratory failure with hypoxia: Secondary | ICD-10-CM

## 2019-11-19 NOTE — Progress Notes (Signed)
Subjective:  Patient ID: Steven Hester, male    DOB: [birth year]  Age: 74 y.o. MRN: [id number]  Chief Complaint  Patient presents with   Chronic Hypoxic Respiratory Failure    HPI: patient has chronic hypoxia at night and at times during day with exertion.  He wears 2L/min at night by nasal canula. He has portable oxygen for day when he exerts himself.   He is sleeping better and has more energy th next day with resolution of diastolic CHF symptoms. He displays better stamina with routine duties around house. He requires this to maintain his daily function and to prevent deterioration.  The home health nurse confirms he desaturates at night and at times during day. He has portable oxygen for activity that causes desaturation.  Walking test O2 sat on RA at rest 97 pulse 86, after ambulating it is 96% and pulse 95 bpm.  He is dyspneic after walking and desturated in the sun walking outside   Current Outpatient Medications on File Prior to Visit  Medication Sig Dispense Refill   amiodarone (PACERONE) 200 MG tablet Take 200 mg by mouth daily.     amLODipine-olmesartan (AZOR) 10-40 MG tablet Take 1 tablet by mouth once daily 90 tablet 2   atorvastatin (LIPITOR) 80 MG tablet Take 1 tablet (80 mg total) by mouth daily. 90 tablet 1   cyanocobalamin 1000 MCG tablet Take by mouth.     diclofenac Sodium (VOLTAREN) 1 % GEL Apply topically.     diltiazem (CARDIZEM CD) 120 MG 24 hr capsule Take 120 mg by mouth daily.     fenofibrate (TRICOR) 145 MG tablet Take 1 tablet by mouth once daily 90 tablet 2   Flaxseed, Linseed, (FLAX SEED OIL) 1000 MG CAPS Take by mouth.     furosemide (LASIX) 40 MG tablet      potassium chloride (MICRO-K) 10 MEQ CR capsule Take 1 capsule (10 mEq total) by mouth 2 (two) times daily. 60 capsule 3   Sennosides 25 MG TABS Take by mouth.     warfarin (COUMADIN) 5 MG tablet Take 5 mg by mouth daily.     nitroGLYCERIN (NITROSTAT) 0.4 MG SL tablet Place under the  tongue.     No current facility-administered medications on file prior to visit.   Past Medical History:  Diagnosis Date   Atrial fibrillation (HCC)    CAD (coronary artery disease)    Chronic atrial fibrillation (HCC)    DM II (diabetes mellitus, type II), controlled ([place])    Essential hypertension    HLD (hyperlipidemia)    MI (myocardial infarction) ([place])    Mixed hyperlipidemia    Other primary thrombocytopenia ([place]) 08/27/2019   Type 2 diabetes mellitus with other specified complication [facility])    Past Surgical History:  Procedure Laterality Date   CORONARY ANGIOPLASTY WITH STENT PLACEMENT      Family History  Problem Relation Age of Onset   Heart attack Mother    GI Disease Father    Cancer Father    Social History   Socioeconomic History   Marital status: Married    Spouse name: Not on file   Number of children: Not on file   Years of education: Not on file   Highest education level: Not on file  Occupational History   Not on file  Tobacco Use   Smoking status: Former Smoker    Packs/day: 1.00    Years: 10.00    Pack years: 10.00    Types:  Cigarettes   Smokeless tobacco: Never Used  Substance and Sexual Activity   Alcohol use: Never   Drug use: Never   Sexual activity: Not on file  Other Topics Concern   Not on file  Social History Narrative   Not on file   Social Determinants of Health   Financial Resource Strain:    Difficulty of Paying Living Expenses:   Food Insecurity:    Worried About Charity fundraiser in the Last Year:    Arboriculturist in the Last Year:   Transportation Needs:    Film/video editor (Medical):    Lack of Transportation (Non-Medical):   Physical Activity:    Days of Exercise per Week:    Minutes of Exercise per Session:   Stress:    Feeling of Stress :   Social Connections:    Frequency of Communication with Friends and Family:    Frequency of Social Gatherings with Friends and  Family:    Attends Religious Services:    Active Member of Clubs or Organizations:    Attends Archivist Meetings:    Marital Status:     Review of Systems  Constitutional: Positive for fatigue.  HENT: Negative.   Eyes: Negative.   Respiratory: Negative.   Cardiovascular: Negative.   Gastrointestinal: Negative.   Genitourinary: Negative.   Musculoskeletal: Negative.   Skin: Negative.   Neurological: Negative.      Objective:  BP 122/60    Pulse 84    Temp 97.7 F (36.5 C)    Resp 16    Ht 6\' 1"  (1.854 m)    Wt 290 lb 12.8 oz (131.9 kg)    SpO2 96%    BMI 38.37 kg/m   BP/Weight 11/19/2019 08/27/2019 8/32/9191  Systolic BP 660 600 459  Diastolic BP 60 70 80  Wt. (Lbs) 290.8 285 354  BMI 38.37 37.6 46.7    Physical Exam Vitals reviewed.  Constitutional:      Appearance: Normal appearance.  HENT:     Head: Normocephalic and atraumatic.  Eyes:     Extraocular Movements: Extraocular movements intact.     Conjunctiva/sclera: Conjunctivae normal.     Pupils: Pupils are equal, round, and reactive to light.  Cardiovascular:     Rate and Rhythm: Normal rate and regular rhythm.     Pulses: Normal pulses.     Heart sounds: Normal heart sounds.  Pulmonary:     Effort: Pulmonary effort is normal.     Breath sounds: Normal breath sounds.  Musculoskeletal:        General: Normal range of motion.  Neurological:     Mental Status: He is alert.       Lab Results  Component Value Date   WBC 6.4 08/27/2019   HGB 12.8 (L) 08/27/2019   HCT 39.4 08/27/2019   PLT 148 (L) 08/27/2019   GLUCOSE 101 (H) 08/27/2019   CHOL 141 08/27/2019   TRIG 94 08/27/2019   HDL 45 08/27/2019   LDLCALC 78 08/27/2019   ALT 16 08/27/2019   AST 28 08/27/2019   NA 145 (H) 08/27/2019   K 3.9 08/27/2019   CL 106 08/27/2019   CREATININE 1.70 (H) 08/27/2019   BUN 22 08/27/2019   CO2 22 08/27/2019   HGBA1C 5.7 (H) 08/27/2019      Assessment & Plan:    1. Chronic hypoxemic  respiratory failure (HCC) Patient has chronic COPD and diastolic CHF (HFpEF)  And desaturates at night  and with activity especially when hot.  He requires chronic O2- 2L/min  To mainatin present function and to prevent deterioration of condition.        Follow-up: No follow-ups on file.  An After Visit Summary was printed and given to the patient.  [place] [phone]

## 2019-11-20 DIAGNOSIS — G4733 Obstructive sleep apnea (adult) (pediatric): Secondary | ICD-10-CM | POA: Diagnosis not present

## 2019-11-25 DIAGNOSIS — I480 Paroxysmal atrial fibrillation: Secondary | ICD-10-CM | POA: Diagnosis not present

## 2019-11-26 DIAGNOSIS — J449 Chronic obstructive pulmonary disease, unspecified: Secondary | ICD-10-CM | POA: Diagnosis not present

## 2019-12-04 DIAGNOSIS — E1140 Type 2 diabetes mellitus with diabetic neuropathy, unspecified: Secondary | ICD-10-CM | POA: Diagnosis not present

## 2019-12-04 DIAGNOSIS — J9611 Chronic respiratory failure with hypoxia: Secondary | ICD-10-CM | POA: Diagnosis not present

## 2019-12-06 DIAGNOSIS — J9611 Chronic respiratory failure with hypoxia: Secondary | ICD-10-CM | POA: Diagnosis not present

## 2019-12-27 DIAGNOSIS — J449 Chronic obstructive pulmonary disease, unspecified: Secondary | ICD-10-CM | POA: Diagnosis not present

## 2019-12-29 ENCOUNTER — Other Ambulatory Visit: Payer: Self-pay

## 2019-12-29 ENCOUNTER — Ambulatory Visit (INDEPENDENT_AMBULATORY_CARE_PROVIDER_SITE_OTHER): Payer: Medicare Other | Admitting: Legal Medicine

## 2019-12-29 ENCOUNTER — Encounter: Payer: Self-pay | Admitting: Legal Medicine

## 2019-12-29 VITALS — BP 140/60 | HR 72 | Temp 97.7°F | Resp 16 | Ht 73.0 in | Wt 291.2 lb

## 2019-12-29 DIAGNOSIS — E785 Hyperlipidemia, unspecified: Secondary | ICD-10-CM | POA: Diagnosis not present

## 2019-12-29 DIAGNOSIS — E782 Mixed hyperlipidemia: Secondary | ICD-10-CM | POA: Diagnosis not present

## 2019-12-29 DIAGNOSIS — I2510 Atherosclerotic heart disease of native coronary artery without angina pectoris: Secondary | ICD-10-CM | POA: Diagnosis not present

## 2019-12-29 DIAGNOSIS — Z7901 Long term (current) use of anticoagulants: Secondary | ICD-10-CM

## 2019-12-29 DIAGNOSIS — Z6838 Body mass index (BMI) 38.0-38.9, adult: Secondary | ICD-10-CM

## 2019-12-29 DIAGNOSIS — Z955 Presence of coronary angioplasty implant and graft: Secondary | ICD-10-CM | POA: Diagnosis not present

## 2019-12-29 DIAGNOSIS — Z6836 Body mass index (BMI) 36.0-36.9, adult: Secondary | ICD-10-CM | POA: Insufficient documentation

## 2019-12-29 DIAGNOSIS — I482 Chronic atrial fibrillation, unspecified: Secondary | ICD-10-CM | POA: Diagnosis not present

## 2019-12-29 DIAGNOSIS — D6949 Other primary thrombocytopenia: Secondary | ICD-10-CM

## 2019-12-29 DIAGNOSIS — I480 Paroxysmal atrial fibrillation: Secondary | ICD-10-CM | POA: Diagnosis not present

## 2019-12-29 DIAGNOSIS — E1169 Type 2 diabetes mellitus with other specified complication: Secondary | ICD-10-CM | POA: Diagnosis not present

## 2019-12-29 DIAGNOSIS — I452 Bifascicular block: Secondary | ICD-10-CM | POA: Diagnosis not present

## 2019-12-29 DIAGNOSIS — I10 Essential (primary) hypertension: Secondary | ICD-10-CM

## 2019-12-29 DIAGNOSIS — Z87891 Personal history of nicotine dependence: Secondary | ICD-10-CM | POA: Diagnosis not present

## 2019-12-29 DIAGNOSIS — I252 Old myocardial infarction: Secondary | ICD-10-CM | POA: Diagnosis not present

## 2019-12-29 MED ORDER — ATORVASTATIN CALCIUM 80 MG PO TABS: 80.0000 mg | ORAL_TABLET | Freq: Every day | ORAL | 1 refills | Status: DC

## 2019-12-29 MED ORDER — POTASSIUM CHLORIDE ER 10 MEQ PO CPCR: 10.0000 meq | ORAL_CAPSULE | Freq: Two times a day (BID) | ORAL | 3 refills | Status: DC

## 2019-12-29 MED ORDER — AMLODIPINE-OLMESARTAN 10-40 MG PO TABS: 1.0000 | ORAL_TABLET | Freq: Every day | ORAL | 2 refills | Status: DC

## 2019-12-29 NOTE — Progress Notes (Signed)
Subjective:  Patient ID: Steven Hester, male    DOB: 13-Mar-1946  Age: 74 y.o. MRN: [id number]  Chief Complaint  Patient presents with  . Hyperlipidemia  . Hypertension  . Diabetes    HPI: chronic visit  Patient present with type 2 diabetes.  Specifically, this is type 2, noninsulin requiring diabetes, complicated by hyperlipidemia and hypertension.  Compliance with treatment has been good; patient take medicines as directed, maintains diet and exercise regimen, follows up as directed, and is keeping glucose diary.  Date of  diagnosis 2010.  Depression screen has been performed.Tobacco negative . Current medicines for diabetes diet and exericise.  Patient is on olmesartan for renal protection and atorvastatin for cholesterol control.  Patient performs foot exams daily and last ophthalmologic exam was not yet.  Patient presents for follow up of hypertension.  Patient tolerating amldipine/omlmesartan, diltiazen well with no side effects.  Patient was diagnosed with hypertension 10 so has been treated for hypertension for 10 years.Patient is working on maintaining diet and exercise regimen and follows up as directed. Comaplication include atrial fibrillation and CAD.  Patient presents with hyperlipidemia.  Compliance with treatment has been good; patient takes medicines as directed, maintains low cholesterol diet, follows up as directed, and maintains exercise regimen.  Patient is using atorvastatin without problems.  Patient presents with HFpEF  that is stable. Diagnosis made 2020.  The course of the disease is stable.  Current medicines include amiodarone, diltiazem, furosemide. Patient follows a low cholesterol diet and maintains a weight diary.  Patient is on low salt, low cholesterol diet and avoids alcohol.  Patient denies adverse effects of medicines. Patient is monitoring weight and has no weight change of weight.  Patient is having no pedal edema, no PND and no PND.  Patient is  continuing to see cardiology.   Current Outpatient Medications on File Prior to Visit  Medication Sig Dispense Refill  . amiodarone (PACERONE) 200 MG tablet Take 200 mg by mouth daily.    . cyanocobalamin 1000 MCG tablet Take by mouth.    . diclofenac Sodium (VOLTAREN) 1 % GEL Apply topically.    Marland Kitchen diltiazem (CARDIZEM CD) 120 MG 24 hr capsule Take 120 mg by mouth daily.    . fenofibrate (TRICOR) 145 MG tablet Take 1 tablet by mouth once daily 90 tablet 2  . Flaxseed, Linseed, (FLAX SEED OIL) 1000 MG CAPS Take by mouth.    . furosemide (LASIX) 40 MG tablet     . Sennosides 25 MG TABS Take by mouth.    . warfarin (COUMADIN) 5 MG tablet Take 5 mg by mouth daily.    . nitroGLYCERIN (NITROSTAT) 0.4 MG SL tablet Place under the tongue.     No current facility-administered medications on file prior to visit.   Past Medical History:  Diagnosis Date  . Chronic atrial fibrillation ([place])   . Essential hypertension   . HLD (hyperlipidemia)   . Mixed hyperlipidemia   . Other primary thrombocytopenia ([place]) 08/27/2019  . Type 2 diabetes mellitus with other specified complication [facility])    Past Surgical History:  Procedure Laterality Date  . CORONARY ANGIOPLASTY WITH STENT PLACEMENT      Family History  Problem Relation Age of Onset  . Heart attack Mother   . GI Disease Father   . Cancer Father    Social History   Socioeconomic History  . Marital status: Married    Spouse name: Not on file  . Number of children: Not on  file  . Years of education: Not on file  . Highest education level: Not on file  Occupational History  . Not on file  Tobacco Use  . Smoking status: Former Smoker    Packs/day: 1.00    Years: 10.00    Pack years: 10.00    Types: Cigarettes  . Smokeless tobacco: Never Used  Substance and Sexual Activity  . Alcohol use: Never  . Drug use: Never  . Sexual activity: Not on file  Other Topics Concern  . Not on file  Social History Narrative  . Not on file    Social Determinants of Health   Financial Resource Strain:   . Difficulty of Paying Living Expenses: Not on file  Food Insecurity:   . Worried About Charity fundraiser in the Last Year: Not on file  . Ran Out of Food in the Last Year: Not on file  Transportation Needs:   . Lack of Transportation (Medical): Not on file  . Lack of Transportation (Non-Medical): Not on file  Physical Activity:   . Days of Exercise per Week: Not on file  . Minutes of Exercise per Session: Not on file  Stress:   . Feeling of Stress : Not on file  Social Connections:   . Frequency of Communication with Friends and Family: Not on file  . Frequency of Social Gatherings with Friends and Family: Not on file  . Attends Religious Services: Not on file  . Active Member of Clubs or Organizations: Not on file  . Attends Archivist Meetings: Not on file  . Marital Status: Not on file    Review of Systems  Constitutional: Negative.   HENT: Negative.   Eyes: Negative.   Respiratory: Negative.   Cardiovascular: Negative.   Gastrointestinal: Negative.   Endocrine: Negative.   Genitourinary: Negative.   Musculoskeletal: Negative.   Skin: Negative.   Neurological: Negative.   Hematological: Negative.   Psychiatric/Behavioral: Negative.      Objective:  BP 140/60   Pulse 72   Temp 97.7 F (36.5 C)   Resp 16   Ht 6\' 1"  (1.854 m)   Wt 291 lb 3.2 oz (132.1 kg)   SpO2 98%   BMI 38.42 kg/m   BP/Weight 12/29/2019 11/19/2019 11/06/6376  Systolic BP 588 502 774  Diastolic BP 60 60 70  Wt. (Lbs) 291.2 290.8 285  BMI 38.42 38.37 37.6    Physical Exam Vitals reviewed.  Constitutional:      Appearance: Normal appearance. He is obese.  HENT:     Head: Normocephalic.     Right Ear: Tympanic membrane normal.     Left Ear: Tympanic membrane normal.     Mouth/Throat:     Mouth: Mucous membranes are moist.  Eyes:     Extraocular Movements: Extraocular movements intact.      Conjunctiva/sclera: Conjunctivae normal.     Pupils: Pupils are equal, round, and reactive to light.  Cardiovascular:     Rate and Rhythm: Normal rate and regular rhythm.     Pulses: Normal pulses.     Heart sounds: Normal heart sounds.  Pulmonary:     Effort: Pulmonary effort is normal.     Breath sounds: Normal breath sounds.  Abdominal:     General: Abdomen is flat. Bowel sounds are normal.     Palpations: Abdomen is soft.  Musculoskeletal:        General: Normal range of motion.     Cervical back: Normal range  of motion and neck supple.  Skin:    General: Skin is warm.     Capillary Refill: Capillary refill takes less than 2 seconds.  Neurological:     General: No focal deficit present.     Mental Status: He is alert. Mental status is at baseline.  Psychiatric:        Mood and Affect: Mood normal.        Thought Content: Thought content normal.        Judgment: Judgment normal.     Diabetic Foot Exam - Simple   Simple Foot Form Diabetic Foot exam was performed with the following findings: Yes 12/29/2019  9:11 AM  Visual Inspection See comments: Yes Sensation Testing Pulse Check See comments: Yes Comments Bunion and hammer toes, poor capillary refill, normal sensation      Lab Results  Component Value Date   WBC 6.4 08/27/2019   HGB 12.8 (L) 08/27/2019   HCT 39.4 08/27/2019   PLT 148 (L) 08/27/2019   GLUCOSE 101 (H) 08/27/2019   CHOL 141 08/27/2019   TRIG 94 08/27/2019   HDL 45 08/27/2019   LDLCALC 78 08/27/2019   ALT 16 08/27/2019   AST 28 08/27/2019   NA 145 (H) 08/27/2019   K 3.9 08/27/2019   CL 106 08/27/2019   CREATININE 1.70 (H) 08/27/2019   BUN 22 08/27/2019   CO2 22 08/27/2019   HGBA1C 5.7 (H) 08/27/2019      Assessment & Plan:   1. Mixed hyperlipidemia - Lipid panel AN INDIVIDUAL CARE PLAN for hyperlipidemia/ cholesterol was established and reinforced today.  The patient's status was assessed using clinical findings on exam, lab and  other diagnostic tests. The patient's disease status was assessed based on evidence-based guidelines and found to be well controlled. MEDICATIONS were reviewed. SELF MANAGEMENT GOALS have been discussed and patient's success at attaining the goal of low cholesterol was assessed. RECOMMENDATION given include regular exercise 3 days a week and low cholesterol/low fat diet. CLINICAL SUMMARY including written plan to identify barriers unique to the patient due to social or economic  reasons was discussed.  2. Essential hypertension - CBC with Differential/Platelet - Comprehensive metabolic panel - potassium chloride (MICRO-K) 10 MEQ CR capsule; Take 1 capsule (10 mEq total) by mouth 2 (two) times daily.  Dispense: 60 capsule; Refill: 3 - amLODipine-olmesartan (AZOR) 10-40 MG tablet; Take 1 tablet by mouth daily.  Dispense: 90 tablet; Refill: 2 An individual hypertension care plan was established and reinforced today.  The patient's status was assessed using clinical findings on exam and labs or diagnostic tests. The patient's success at meeting treatment goals on disease specific evidence-based guidelines and found to be well controlled. SELF MANAGEMENT: The patient and I together assessed ways to personally work towards obtaining the recommended goals. RECOMMENDATIONS: avoid decongestants found in common cold remedies, decrease consumption of alcohol, perform routine monitoring of BP with home BP cuff, exercise, reduction of dietary salt, take medicines as prescribed, try not to miss doses and quit smoking.  Regular exercise and maintaining a healthy weight is needed.  Stress reduction may help. A CLINICAL SUMMARY including written plan identify barriers to care unique to individual due to social or financial issues.  We attempt to mutually creat solutions for individual and family understanding.  3. Chronic atrial fibrillation [facility]) Patient has a diagnosis of permanent atrial fibrillation.   Patient  is on Coumadin and check by Cardiology and has controlled ventricular response.  Patient is CV stable. 4.  Other primary thrombocytopenia ([place]) This is a chronic condition and he has no bleeding diathesis  5. Type 2 diabetes mellitus with other specified complication, unspecified whether long term insulin use (H[facility]) - Hemoglobin A1c An individual care plan for diabetes was established and reinforced today.  The patient's status was assessed using clinical findings on exam, labs and diagnostic testing. Patient success at meeting goals based on disease specific evidence-based guidelines and found to be good controlled. Medications were assessed and patient's understanding of the medical issues , including barriers were assessed. Recommend adherence to a diabetic diet, a graduated exercise program, HgbA1c level is checked quarterly, and urine microalbumin performed yearly .  Annual mono-filament sensation testing performed. Lower blood pressure and control hyperlipidemia is important. Get annual eye exams and annual flu shots and smoking cessation discussed.  Self management goals were discussed.  6. CAD Patient's CAD was assessed using history and physical along with other information to maximize treatment.  Evidence based criteria was use in deciding proper management for this disease process.  Patient's CAD is  under good control.therapy continue medicines.  7. Current use of long term anticoagulation Stable on long term coumadin checked at cardiology 8. BMI 38.0-38.9,adult An individualize plan was formulated for obesity using patient history and physical exam to encourage weight loss.  An evidence based program was formulated.  Patient is to cut portion size with meals and to plan physical exercise 3 days a week at least 20 minutes.  Weight watchers and other programs are helpful.  Planned amount of weight loss 10 lbs. Patient meets morbid obesity criteria with hypertension and diabetes  9. Morbid  obesity ([place]) An individualize plan was formulated for obesity using patient history and physical exam to encourage weight loss.  An evidence based program was formulated.  Patient is to cut portion size with meals and to plan physical exercise 3 days a week at least 20 minutes.  Weight watchers and other programs are helpful.  Planned amount of weight loss 10 lbs.    Meds ordered this encounter  Medications  . DISCONTD: atorvastatin (LIPITOR) 80 MG tablet    Sig: Take 1 tablet (80 mg total) by mouth daily.    Dispense:  90 tablet    Refill:  1  . potassium chloride (MICRO-K) 10 MEQ CR capsule    Sig: Take 1 capsule (10 mEq total) by mouth 2 (two) times daily.    Dispense:  60 capsule    Refill:  3  . amLODipine-olmesartan (AZOR) 10-40 MG tablet    Sig: Take 1 tablet by mouth daily.    Dispense:  90 tablet    Refill:  2    Orders Placed This Encounter  Procedures  . CBC with Differential/Platelet  . Comprehensive metabolic panel  . Lipid panel  . Hemoglobin A1c     Follow-up: Return in about 4 months (around 04/29/2020) for fasting.  An After Visit Summary was printed and given to the patient.  [place] [phone]

## 2019-12-30 LAB — COMPREHENSIVE METABOLIC PANEL
ALT: 16 IU/L (ref 0–44)
AST: 23 IU/L (ref 0–40)
Albumin/Globulin Ratio: 1.7 (ref 1.2–2.2)
Albumin: 4.2 g/dL (ref 3.7–4.7)
Alkaline Phosphatase: 74 IU/L (ref 48–121)
BUN/Creatinine Ratio: 14 (ref 10–24)
BUN: 25 mg/dL (ref 8–27)
Bilirubin Total: 0.5 mg/dL (ref 0.0–1.2)
CO2: 22 mmol/L (ref 20–29)
Calcium: 9 mg/dL (ref 8.6–10.2)
Chloride: 104 mmol/L (ref 96–106)
Creatinine, Ser: 1.78 mg/dL — ABNORMAL HIGH (ref 0.76–1.27)
GFR calc Af Amer: 42 mL/min/{1.73_m2} — ABNORMAL LOW (ref >59)
GFR calc non Af Amer: 37 mL/min/{1.73_m2} — ABNORMAL LOW (ref >59)
Globulin, Total: 2.5 g/dL (ref 1.5–4.5)
Glucose: 102 mg/dL — ABNORMAL HIGH (ref 65–99)
Potassium: 4.6 mmol/L (ref 3.5–5.2)
Sodium: 142 mmol/L (ref 134–144)
Total Protein: 6.7 g/dL (ref 6.0–8.5)

## 2019-12-30 LAB — CBC WITH DIFFERENTIAL/PLATELET
Basophils Absolute: 0 10*3/uL (ref 0.0–0.2)
Basos: 1 %
EOS (ABSOLUTE): 0.1 10*3/uL (ref 0.0–0.4)
Eos: 1 %
Hematocrit: 41.8 % (ref 37.5–51.0)
Hemoglobin: 13.5 g/dL (ref 13.0–17.7)
Immature Grans (Abs): 0 10*3/uL (ref 0.0–0.1)
Immature Granulocytes: 0 %
Lymphocytes Absolute: 2.3 10*3/uL (ref 0.7–3.1)
Lymphs: 35 %
MCH: 29.9 pg (ref 26.6–33.0)
MCHC: 32.3 g/dL (ref 31.5–35.7)
MCV: 93 fL (ref 79–97)
Monocytes Absolute: 0.5 10*3/uL (ref 0.1–0.9)
Monocytes: 8 %
Neutrophils Absolute: 3.6 10*3/uL (ref 1.4–7.0)
Neutrophils: 55 %
Platelets: 106 10*3/uL — ABNORMAL LOW (ref 150–450)
RBC: 4.51 x10E6/uL (ref 4.14–5.80)
RDW: 14.5 % (ref 11.6–15.4)
WBC: 6.6 10*3/uL (ref 3.4–10.8)

## 2019-12-30 LAB — LIPID PANEL
Chol/HDL Ratio: 3.1 ratio (ref 0.0–5.0)
Cholesterol, Total: 136 mg/dL (ref 100–199)
HDL: 44 mg/dL (ref >39)
LDL Chol Calc (NIH): 73 mg/dL (ref 0–99)
Triglycerides: 101 mg/dL (ref 0–149)
VLDL Cholesterol Cal: 19 mg/dL (ref 5–40)

## 2019-12-30 LAB — CARDIOVASCULAR RISK ASSESSMENT

## 2019-12-30 LAB — HEMOGLOBIN A1C
Est. average glucose Bld gHb Est-mCnc: 117 mg/dL
Hgb A1c MFr Bld: 5.7 % — ABNORMAL HIGH (ref 4.8–5.6)

## 2019-12-30 NOTE — Progress Notes (Signed)
Cbc normal, glucose 102, kidney tests stable, Liver tests normal, cholesterol levels normal, A1c 5.7 lp

## 2020-01-01 ENCOUNTER — Other Ambulatory Visit: Payer: Self-pay

## 2020-01-01 DIAGNOSIS — E1169 Type 2 diabetes mellitus with other specified complication: Secondary | ICD-10-CM

## 2020-01-01 MED ORDER — GLUCOSE BLOOD VI STRP: 1.0000 | ORAL_STRIP | Freq: Two times a day (BID) | 3 refills | Status: DC

## 2020-01-02 ENCOUNTER — Telehealth: Payer: Self-pay

## 2020-01-02 ENCOUNTER — Other Ambulatory Visit: Payer: Self-pay | Admitting: Legal Medicine

## 2020-01-02 DIAGNOSIS — I10 Essential (primary) hypertension: Secondary | ICD-10-CM

## 2020-01-02 MED ORDER — AMLODIPINE-OLMESARTAN 10-40 MG PO TABS: 1.0000 | ORAL_TABLET | Freq: Every day | ORAL | 2 refills | Status: DC

## 2020-01-02 MED ORDER — POTASSIUM CHLORIDE ER 10 MEQ PO CPCR: 10.0000 meq | ORAL_CAPSULE | Freq: Two times a day (BID) | ORAL | 3 refills | Status: DC

## 2020-01-04 DIAGNOSIS — J9611 Chronic respiratory failure with hypoxia: Secondary | ICD-10-CM | POA: Diagnosis not present

## 2020-01-06 DIAGNOSIS — J9611 Chronic respiratory failure with hypoxia: Secondary | ICD-10-CM | POA: Diagnosis not present

## 2020-01-15 DIAGNOSIS — R001 Bradycardia, unspecified: Secondary | ICD-10-CM | POA: Diagnosis not present

## 2020-01-15 DIAGNOSIS — I480 Paroxysmal atrial fibrillation: Secondary | ICD-10-CM | POA: Diagnosis not present

## 2020-01-15 DIAGNOSIS — Z45018 Encounter for adjustment and management of other part of cardiac pacemaker: Secondary | ICD-10-CM | POA: Diagnosis not present

## 2020-01-26 ENCOUNTER — Other Ambulatory Visit: Payer: Self-pay

## 2020-01-26 MED ORDER — AMIODARONE HCL 200 MG PO TABS
200.0000 mg | ORAL_TABLET | Freq: Every day | ORAL | 2 refills | Status: DC
Start: 2020-01-26 — End: 2020-09-01

## 2020-01-27 DIAGNOSIS — J449 Chronic obstructive pulmonary disease, unspecified: Secondary | ICD-10-CM | POA: Diagnosis not present

## 2020-02-04 DIAGNOSIS — J9611 Chronic respiratory failure with hypoxia: Secondary | ICD-10-CM | POA: Diagnosis not present

## 2020-02-05 DIAGNOSIS — J9611 Chronic respiratory failure with hypoxia: Secondary | ICD-10-CM | POA: Diagnosis not present

## 2020-02-08 ENCOUNTER — Other Ambulatory Visit: Payer: Self-pay | Admitting: Legal Medicine

## 2020-02-09 ENCOUNTER — Encounter: Payer: Self-pay | Admitting: Legal Medicine

## 2020-02-09 ENCOUNTER — Other Ambulatory Visit: Payer: Self-pay

## 2020-02-09 ENCOUNTER — Ambulatory Visit (INDEPENDENT_AMBULATORY_CARE_PROVIDER_SITE_OTHER): Payer: Medicare Other

## 2020-02-09 DIAGNOSIS — Z23 Encounter for immunization: Secondary | ICD-10-CM

## 2020-02-09 NOTE — Progress Notes (Signed)
    Patient Name: Steven Hester Patient DOB: July 17, 1945  02/09/2020  Lillard Anes Smullen came in today for his 2021-2022 influenza vaccine.  Patient tolerated it well.   Erie Noe

## 2020-02-12 DIAGNOSIS — I480 Paroxysmal atrial fibrillation: Secondary | ICD-10-CM | POA: Diagnosis not present

## 2020-02-20 DIAGNOSIS — G4733 Obstructive sleep apnea (adult) (pediatric): Secondary | ICD-10-CM | POA: Diagnosis not present

## 2020-03-05 DIAGNOSIS — J9611 Chronic respiratory failure with hypoxia: Secondary | ICD-10-CM | POA: Diagnosis not present

## 2020-03-08 ENCOUNTER — Other Ambulatory Visit: Payer: Self-pay | Admitting: Legal Medicine

## 2020-03-11 DIAGNOSIS — I480 Paroxysmal atrial fibrillation: Secondary | ICD-10-CM | POA: Diagnosis not present

## 2020-04-05 DIAGNOSIS — J9611 Chronic respiratory failure with hypoxia: Secondary | ICD-10-CM | POA: Diagnosis not present

## 2020-04-06 DIAGNOSIS — J9611 Chronic respiratory failure with hypoxia: Secondary | ICD-10-CM | POA: Diagnosis not present

## 2020-04-08 DIAGNOSIS — I480 Paroxysmal atrial fibrillation: Secondary | ICD-10-CM | POA: Diagnosis not present

## 2020-05-04 ENCOUNTER — Ambulatory Visit (INDEPENDENT_AMBULATORY_CARE_PROVIDER_SITE_OTHER): Payer: Medicare Other | Admitting: Legal Medicine

## 2020-05-04 ENCOUNTER — Other Ambulatory Visit: Payer: Self-pay

## 2020-05-04 ENCOUNTER — Encounter: Payer: Self-pay | Admitting: Legal Medicine

## 2020-05-04 DIAGNOSIS — E1151 Type 2 diabetes mellitus with diabetic peripheral angiopathy without gangrene: Secondary | ICD-10-CM | POA: Diagnosis not present

## 2020-05-04 DIAGNOSIS — Z0000 Encounter for general adult medical examination without abnormal findings: Secondary | ICD-10-CM

## 2020-05-04 NOTE — Progress Notes (Signed)
Subjective:  Patient ID: Steven Hester, male    DOB: May 24, 1945  Age: 74 y.o. MRN: [id number]  Chief Complaint  Patient presents with  . Annual Exam    AWV    HPI Encounter for general adult medical examination without abnormal findings  Physical ("At Risk" items are starred): Patient's last physical exam was 1 year ago .   Growth percentile SmartLinks can only be used for patients less than 52 years old.   SDOH Screenings   Alcohol Screen: Low Risk   . Last Alcohol Screening Score (AUDIT): 0  Depression (PHQ2-9): Low Risk   . PHQ-2 Score: 0  Financial Resource Strain: Low Risk   . Difficulty of Paying Living Expenses: Not hard at all  Food Insecurity: No Food Insecurity  . Worried About Charity fundraiser in the Last Year: Never true  . Ran Out of Food in the Last Year: Never true  Housing: Not on file  Physical Activity: Insufficiently Active  . Days of Exercise per Week: 2 days  . Minutes of Exercise per Session: 30 min  Social Connections: Moderately Isolated  . Frequency of Communication with Friends and Family: Three times a week  . Frequency of Social Gatherings with Friends and Family: Never  . Attends Religious Services: Never  . Active Member of Clubs or Organizations: No  . Attends Archivist Meetings: Never  . Marital Status: Married  Stress: No Stress Concern Present  . Feeling of Stress : Not at all  Tobacco Use: Medium Risk  . Smoking Tobacco Use: Former Smoker  . Smokeless Tobacco Use: Never Used  Transportation Needs: No Transportation Needs  . Lack of Transportation (Medical): No  . Lack of Transportation (Non-Medical): No    Fall Risk  05/04/2020 11/19/2019 08/27/2019  Falls in the past year? 0 0 -  Number falls in past yr: 0 0 0  Injury with Fall? 0 0 0  Risk for fall due to : No Fall Risks - -  Follow up - Falls evaluation completed Falls evaluation completed    Depression screen [facility] 2/9 05/04/2020 08/27/2019  Decreased  Interest 0 0  Down, Depressed, Hopeless 0 0  PHQ - 2 Score 0 0    Functional Status Survey: Is the patient deaf or have difficulty hearing?: No Does the patient have difficulty seeing, even when wearing glasses/contacts?: Yes Does the patient have difficulty concentrating, remembering, or making decisions?: No Does the patient have difficulty walking or climbing stairs?: No Does the patient have difficulty dressing or bathing?: No Does the patient have difficulty doing errands alone such as visiting a doctor's office or shopping?: No   Safety: reviewed ;  Patient wears a seat belt. Patient's home has smoke detectors and carbon monoxide detectors. Patient practices appropriate gun safety Patient wears sunscreen with extended sun exposure. Dental Care: biannual cleanings, brushes and flosses daily. Ophthalmology/Optometry: Annual visit.  Hearing loss: none Vision impairments: none Patient is not afflicted from Stress Incontinence and Urge Incontinence   Current Outpatient Medications on File Prior to Visit  Medication Sig Dispense Refill  . amiodarone (PACERONE) 200 MG tablet Take 1 tablet (200 mg total) by mouth daily. 90 tablet 2  . amLODipine-olmesartan (AZOR) 10-40 MG tablet Take 1 tablet by mouth daily. 90 tablet 2  . atorvastatin (LIPITOR) 80 MG tablet Take 1 tablet by mouth once daily 90 tablet 2  . canagliflozin (INVOKANA) 100 MG TABS tablet Take by mouth.    . cyanocobalamin 1000  MCG tablet Take by mouth.    . diclofenac Sodium (VOLTAREN) 1 % GEL Apply topically.    Marland Kitchen diltiazem (CARDIZEM CD) 120 MG 24 hr capsule Take 120 mg by mouth daily.    . fenofibrate (TRICOR) 145 MG tablet Take 1 tablet by mouth once daily 90 tablet 2  . Flaxseed, Linseed, (FLAX SEED OIL) 1000 MG CAPS Take by mouth.    . furosemide (LASIX) 40 MG tablet Take 1 tablet by mouth once daily 90 tablet 2  . glucose blood test strip 1 each by Other route 2 (two) times daily. Use as instructed 100 each 3  .  potassium chloride (MICRO-K) 10 MEQ CR capsule Take 1 capsule (10 mEq total) by mouth 2 (two) times daily. 60 capsule 3  . Sennosides 25 MG TABS Take by mouth.    . warfarin (COUMADIN) 5 MG tablet Take 5 mg by mouth daily.    . nitroGLYCERIN (NITROSTAT) 0.4 MG SL tablet Place under the tongue.     No current facility-administered medications on file prior to visit.    Social Hx   Social History   Socioeconomic History  . Marital status: Married    Spouse name: Not on file  . Number of children: Not on file  . Years of education: Not on file  . Highest education level: Not on file  Occupational History  . Not on file  Tobacco Use  . Smoking status: Former Smoker    Packs/day: 1.00    Years: 10.00    Pack years: 10.00    Types: Cigarettes  . Smokeless tobacco: Never Used  Substance and Sexual Activity  . Alcohol use: Yes    Comment: occaionally   . Drug use: Never  . Sexual activity: Not on file  Other Topics Concern  . Not on file  Social History Narrative  . Not on file   Social Determinants of Health   Financial Resource Strain: Low Risk   . Difficulty of Paying Living Expenses: Not hard at all  Food Insecurity: No Food Insecurity  . Worried About Charity fundraiser in the Last Year: Never true  . Ran Out of Food in the Last Year: Never true  Transportation Needs: No Transportation Needs  . Lack of Transportation (Medical): No  . Lack of Transportation (Non-Medical): No  Physical Activity: Insufficiently Active  . Days of Exercise per Week: 2 days  . Minutes of Exercise per Session: 30 min  Stress: No Stress Concern Present  . Feeling of Stress : Not at all  Social Connections: Moderately Isolated  . Frequency of Communication with Friends and Family: Three times a week  . Frequency of Social Gatherings with Friends and Family: Never  . Attends Religious Services: Never  . Active Member of Clubs or Organizations: No  . Attends Archivist Meetings:  Never  . Marital Status: Married   Past Medical History:  Diagnosis Date  . Chronic atrial fibrillation ([place])   . Essential hypertension   . HLD (hyperlipidemia)   . Mixed hyperlipidemia   . Other primary thrombocytopenia ([place]) 08/27/2019  . Type 2 diabetes mellitus with other specified complication ([facility])    Family History  Problem Relation Age of Onset  . Heart attack Mother   . GI Disease Father   . Cancer Father     Review of Systems  Constitutional: Negative for activity change, appetite change, fatigue and fever.  HENT: Negative for congestion, ear pain and sinus pain.  Eyes: Negative for discharge, itching and visual disturbance.       He has film on eye  Respiratory: Negative for apnea, cough and chest tightness.   Cardiovascular: Negative for chest pain, palpitations and leg swelling.  Gastrointestinal: Negative for abdominal distention and abdominal pain.  Endocrine: Negative for polyuria.  Genitourinary: Negative for difficulty urinating, dysuria and urgency.  Musculoskeletal: Positive for arthralgias. Negative for back pain and myalgias.  Skin: Negative.   Neurological: Negative.  Negative for numbness.  Psychiatric/Behavioral: Negative.      Objective:  BP 130/60   Pulse 86   Ht 6\' 1"  (1.854 m)   Wt (!) 300 lb 3.2 oz (136.2 kg)   SpO2 98%   BMI 39.61 kg/m   BP/Weight 05/04/2020 12/29/2019 4/92/0100  Systolic BP 712 197 588  Diastolic BP 60 60 60  Wt. (Lbs) 300.2 291.2 290.8  BMI 39.61 38.42 38.37    Physical Exam vs reviewed  Lab Results  Component Value Date   WBC 6.6 12/29/2019   HGB 13.5 12/29/2019   HCT 41.8 12/29/2019   PLT 106 (L) 12/29/2019   GLUCOSE 102 (H) 12/29/2019   CHOL 136 12/29/2019   TRIG 101 12/29/2019   HDL 44 12/29/2019   LDLCALC 73 12/29/2019   ALT 16 12/29/2019   AST 23 12/29/2019   NA 142 12/29/2019   K 4.6 12/29/2019   CL 104 12/29/2019   CREATININE 1.78 (H) 12/29/2019   BUN 25 12/29/2019   CO2 22 12/29/2019    HGBA1C 5.7 (H) 12/29/2019      Assessment & Plan:  1. Diabetes mellitus type 2 with peripheral artery disease ([place]) Patient has type 2 diabetes and has peripheral arterial disease he is coming later this week for evaluation of his diabetes and we will take care of it then. 2. Routine general medical examination at a health care facility Patient is here for an annual wellness visit and review of all his immunizations his hearing and vision. He was given information on performing a living well and explained the differences of a living will and a regular financial will. He understands and will complete 1.    These are the goals we discussed: Goals    . Increase physical activity        This is a list of the screening recommended for you and due dates:  Health Maintenance  Topic Date Due  .  Hepatitis C: One time screening is recommended by Center for Disease Control  (CDC) for  adults born from [birth year] through 1967.   Never done  . Eye exam for diabetics  Never done  . Hemoglobin A1C  06/30/2020  . Complete foot exam   12/28/2020  . Tetanus Vaccine  04/20/2027  . Flu Shot  Completed  . COVID-19 Vaccine  Completed  . Pneumonia vaccines  Completed      AN INDIVIDUALIZED CARE PLAN: was established or reinforced today.   SELF MANAGEMENT: The patient and I together assessed ways to personally work towards obtaining the recommended goals  Support needs The patient and/or family needs were assessed and services were offered and not necessary at this time.    Follow-up: Return if symptoms worsen or fail to improve.  Reinaldo Meeker, MD [facility] [phone]

## 2020-05-04 NOTE — Patient Instructions (Signed)
°  Mr. Steven Hester , Thank you for taking time to come for your Medicare Wellness Visit. I appreciate your ongoing commitment to your health goals. Please review the following plan we discussed and let me know if I can assist you in the future.   These are the goals we discussed: Goals     Increase physical activity       This is a list of the screening recommended for you and due dates:  Health Maintenance  Topic Date Due    Hepatitis C: One time screening is recommended by Center for Disease Control  (CDC) for  adults born from [birth year] through 1967.   Never done   Eye exam for diabetics  Never done   Hemoglobin A1C  06/30/2020   Complete foot exam   12/28/2020   Tetanus Vaccine  04/20/2027   Flu Shot  Completed   COVID-19 Vaccine  Completed   Pneumonia vaccines  Completed

## 2020-05-05 DIAGNOSIS — J9611 Chronic respiratory failure with hypoxia: Secondary | ICD-10-CM | POA: Diagnosis not present

## 2020-05-06 ENCOUNTER — Other Ambulatory Visit: Payer: Self-pay

## 2020-05-06 ENCOUNTER — Ambulatory Visit (INDEPENDENT_AMBULATORY_CARE_PROVIDER_SITE_OTHER): Payer: Medicare Other | Admitting: Legal Medicine

## 2020-05-06 ENCOUNTER — Encounter: Payer: Self-pay | Admitting: Legal Medicine

## 2020-05-06 VITALS — BP 138/68 | HR 86 | Temp 97.4°F | Ht 73.0 in | Wt 302.0 lb

## 2020-05-06 DIAGNOSIS — I5032 Chronic diastolic (congestive) heart failure: Secondary | ICD-10-CM

## 2020-05-06 DIAGNOSIS — I482 Chronic atrial fibrillation, unspecified: Secondary | ICD-10-CM | POA: Diagnosis not present

## 2020-05-06 DIAGNOSIS — I10 Essential (primary) hypertension: Secondary | ICD-10-CM

## 2020-05-06 DIAGNOSIS — I2510 Atherosclerotic heart disease of native coronary artery without angina pectoris: Secondary | ICD-10-CM

## 2020-05-06 DIAGNOSIS — M170 Bilateral primary osteoarthritis of knee: Secondary | ICD-10-CM

## 2020-05-06 DIAGNOSIS — E782 Mixed hyperlipidemia: Secondary | ICD-10-CM

## 2020-05-06 DIAGNOSIS — E1151 Type 2 diabetes mellitus with diabetic peripheral angiopathy without gangrene: Secondary | ICD-10-CM | POA: Diagnosis not present

## 2020-05-06 DIAGNOSIS — I480 Paroxysmal atrial fibrillation: Secondary | ICD-10-CM | POA: Diagnosis not present

## 2020-05-06 MED ORDER — ACETAMINOPHEN-CODEINE #3 300-30 MG PO TABS: 2.0000 | ORAL_TABLET | Freq: Three times a day (TID) | ORAL | 0 refills | Status: DC | PRN

## 2020-05-06 NOTE — Progress Notes (Signed)
Subjective:  Patient ID: Steven Hester, male    DOB: 1946-03-27  Age: 74 y.o. MRN: [id number]  Chief Complaint  Patient presents with  . Hyperlipidemia  . Hypertension  . Diabetes    HPI: chronic visit  Patient present with type 2 diabetes.  Specifically, this is type 2, noninsulin requiring diabetes, complicated by PVD.  Compliance with treatment has been good; patient take medicines as directed, maintains diet and exercise regimen, follows up as directed, and is keeping glucose diary.  Date of  diagnosis 10.  Depression screen has been performed.Tobacco screen nonsmoker. Current medicines for diabetes none.  Patient is on none for renal protection and atorvastatin for cholesterol control.  Patient performs foot exams daily and last ophthalmologic exam was yes  Patient presents for follow up of hypertension.  Patient tolerating olmesartn/HCTz well with side effects.  Patient was diagnosed with hypertension 2010 so has been treated for hypertension for 10 years.Patient is working on maintaining diet and exercise regimen and follows up as directed. Complication include cad.  Patient presents with hyperlipidemia.  Compliance with treatment has been good; patient takes medicines as directed, maintains low cholesterol diet, follows up as directed, and maintains exercise regimen.  Patient is using atorvastatin and fenofibrae without problems.  Patient presents with HFpEF  that is stable. Diagnosis made 2019.  The course of the disease is stable.  Current medicines include furosemide olmisartan. Patient follows a low cholesterol diet and maintains a weight diary.  Patient is on low salt, low cholesterol diet and avoids alcohol.  Patient denies adverse effects of medicines. Patient is monitoring weight and has no weight change of weight.  Patient is having no pedal edema, no PND and no PND.  Patient is continuing to see cardiology.   Current Outpatient Medications on File Prior to Visit   Medication Sig Dispense Refill  . amiodarone (PACERONE) 200 MG tablet Take 1 tablet (200 mg total) by mouth daily. 90 tablet 2  . amLODipine-olmesartan (AZOR) 10-40 MG tablet Take 1 tablet by mouth daily. 90 tablet 2  . atorvastatin (LIPITOR) 80 MG tablet Take 1 tablet by mouth once daily 90 tablet 2  . cyanocobalamin 1000 MCG tablet Take by mouth.    . diclofenac Sodium (VOLTAREN) 1 % GEL Apply topically.    Marland Kitchen diltiazem (CARDIZEM CD) 120 MG 24 hr capsule Take 120 mg by mouth daily.    . fenofibrate (TRICOR) 145 MG tablet Take 1 tablet by mouth once daily 90 tablet 2  . Flaxseed, Linseed, (FLAX SEED OIL) 1000 MG CAPS Take by mouth.    . furosemide (LASIX) 40 MG tablet Take 1 tablet by mouth once daily 90 tablet 2  . glucose blood test strip 1 each by Other route 2 (two) times daily. Use as instructed 100 each 3  . nitroGLYCERIN (NITROSTAT) 0.4 MG SL tablet Place under the tongue.    . potassium chloride (MICRO-K) 10 MEQ CR capsule Take 1 capsule (10 mEq total) by mouth 2 (two) times daily. 60 capsule 3  . Sennosides 25 MG TABS Take by mouth.    . warfarin (COUMADIN) 5 MG tablet Take 5 mg by mouth daily.     No current facility-administered medications on file prior to visit.   Past Medical History:  Diagnosis Date  . Chronic atrial fibrillation ([place])   . Essential hypertension   . HLD (hyperlipidemia)   . Mixed hyperlipidemia   . Other primary thrombocytopenia ([place]) 08/27/2019  . Type 2 diabetes mellitus  with other specified complication [facility])    Past Surgical History:  Procedure Laterality Date  . CORONARY ANGIOPLASTY WITH STENT PLACEMENT      Family History  Problem Relation Age of Onset  . Heart attack Mother   . GI Disease Father   . Cancer Father    Social History   Socioeconomic History  . Marital status: Married    Spouse name: Not on file  . Number of children: Not on file  . Years of education: Not on file  . Highest education level: Not on file  Occupational  History  . Not on file  Tobacco Use  . Smoking status: Former Smoker    Packs/day: 1.00    Years: 10.00    Pack years: 10.00    Types: Cigarettes  . Smokeless tobacco: Never Used  Substance and Sexual Activity  . Alcohol use: Yes    Comment: occaionally   . Drug use: Never  . Sexual activity: Not on file  Other Topics Concern  . Not on file  Social History Narrative  . Not on file   Social Determinants of Health   Financial Resource Strain: Low Risk   . Difficulty of Paying Living Expenses: Not hard at all  Food Insecurity: No Food Insecurity  . Worried About Charity fundraiser in the Last Year: Never true  . Ran Out of Food in the Last Year: Never true  Transportation Needs: No Transportation Needs  . Lack of Transportation (Medical): No  . Lack of Transportation (Non-Medical): No  Physical Activity: Insufficiently Active  . Days of Exercise per Week: 2 days  . Minutes of Exercise per Session: 30 min  Stress: No Stress Concern Present  . Feeling of Stress : Not at all  Social Connections: Moderately Isolated  . Frequency of Communication with Friends and Family: Three times a week  . Frequency of Social Gatherings with Friends and Family: Never  . Attends Religious Services: Never  . Active Member of Clubs or Organizations: No  . Attends Archivist Meetings: Never  . Marital Status: Married    Review of Systems  Constitutional: Negative for chills, diaphoresis, fatigue and fever.  HENT: Negative for congestion, ear pain and sore throat.   Respiratory: Negative for cough and shortness of breath.   Cardiovascular: Negative for chest pain and leg swelling.  Gastrointestinal: Negative for abdominal pain, constipation, diarrhea, nausea and vomiting.  Genitourinary: Negative for difficulty urinating, dysuria and urgency.  Musculoskeletal: Negative for arthralgias (knee pain-BL) and myalgias.  Neurological: Negative for dizziness, weakness and headaches.   Psychiatric/Behavioral: Negative for dysphoric mood.     Objective:  BP 138/68   Pulse 86   Temp (!) 97.4 F (36.3 C)   Ht 6\' 1"  (1.854 m)   Wt (!) 302 lb (137 kg)   SpO2 100%   BMI 39.84 kg/m   BP/Weight 05/06/2020 05/04/2020 08/14/8117  Systolic BP 147 829 562  Diastolic BP 68 60 60  Wt. (Lbs) 302 300.2 291.2  BMI 39.84 39.61 38.42    Physical Exam Vitals reviewed.  Constitutional:      General: He is not in acute distress.    Appearance: Normal appearance. He is not toxic-appearing.  HENT:     Head: Normocephalic.     Right Ear: Tympanic membrane, ear canal and external ear normal.     Left Ear: Tympanic membrane, ear canal and external ear normal.     Nose: Nose normal.  Mouth/Throat:     Mouth: Mucous membranes are moist.  Eyes:     Extraocular Movements: Extraocular movements intact.     Conjunctiva/sclera: Conjunctivae normal.     Pupils: Pupils are equal, round, and reactive to light.  Cardiovascular:     Rate and Rhythm: Normal rate. Rhythm irregular.     Pulses: Normal pulses.     Heart sounds: No murmur heard.   Pulmonary:     Effort: No respiratory distress.     Breath sounds: Normal breath sounds. No wheezing.  Abdominal:     General: Abdomen is flat. Bowel sounds are normal. There is no distension.  Musculoskeletal:        General: Normal range of motion.     Cervical back: Normal range of motion and neck supple.     Right lower leg: No edema.     Left lower leg: No edema.  Skin:    General: Skin is warm.     Capillary Refill: Capillary refill takes less than 2 seconds.  Neurological:     General: No focal deficit present.     Mental Status: He is alert and oriented to person, place, and time.     Sensory: Sensory deficit present.  Psychiatric:        Mood and Affect: Mood normal.        Thought Content: Thought content normal.        Judgment: Judgment normal.     Diabetic Foot Exam - Simple   Simple Foot Form Diabetic Foot exam  was performed with the following findings: Yes 05/06/2020  8:29 AM  Visual Inspection See comments: Yes Sensation Testing Intact to touch and monofilament testing bilaterally: Yes See comments: Yes Pulse Check See comments: Yes Comments Decreased pulses in feet, hammer toes with onchomycosis      Lab Results  Component Value Date   WBC 6.6 12/29/2019   HGB 13.5 12/29/2019   HCT 41.8 12/29/2019   PLT 106 (L) 12/29/2019   GLUCOSE 102 (H) 12/29/2019   CHOL 136 12/29/2019   TRIG 101 12/29/2019   HDL 44 12/29/2019   LDLCALC 73 12/29/2019   ALT 16 12/29/2019   AST 23 12/29/2019   NA 142 12/29/2019   K 4.6 12/29/2019   CL 104 12/29/2019   CREATININE 1.78 (H) 12/29/2019   BUN 25 12/29/2019   CO2 22 12/29/2019   HGBA1C 5.7 (H) 12/29/2019      Assessment & Plan:   1. Chronic atrial fibrillation [facility]) Patient has a diagnosis of permanent atrial fibrillation.   Patient is on warfarin anticoagulant and has controlled ventricular response.  Patient is CV stable.cardiology was to draw.  I sent copies of labs to cardiology  2. Mixed hyperlipidemia - Lipid panel AN INDIVIDUAL CARE PLAN for hyperlipidemia/ cholesterol was established and reinforced today.  The patient's status was assessed using clinical findings on exam, lab and other diagnostic tests. The patient's disease status was assessed based on evidence-based guidelines and found to be well controlled. MEDICATIONS were reviewed. SELF MANAGEMENT GOALS have been discussed and patient's success at attaining the goal of low cholesterol was assessed. RECOMMENDATION given include regular exercise 3 days a week and low cholesterol/low fat diet. CLINICAL SUMMARY including written plan to identify barriers unique to the patient due to social or economic  reasons was discussed.  3. Essential hypertension - Comprehensive metabolic panel - CBC with Differential/Platelet An individual hypertension care plan was established and  reinforced today.  The patient's status  was assessed using clinical findings on exam and labs or diagnostic tests. The patient's success at meeting treatment goals on disease specific evidence-based guidelines and found to be well controlled. SELF MANAGEMENT: The patient and I together assessed ways to personally work towards obtaining the recommended goals. RECOMMENDATIONS: avoid decongestants found in common cold remedies, decrease consumption of alcohol, perform routine monitoring of BP with home BP cuff, exercise, reduction of dietary salt, take medicines as prescribed, try not to miss doses and quit smoking.  Regular exercise and maintaining a healthy weight is needed.  Stress reduction may help. A CLINICAL SUMMARY including written plan identify barriers to care unique to individual due to social or financial issues.  We attempt to mutually creat solutions for individual and family understanding.  4. Diabetes mellitus type 2 with peripheral artery disease ([place]) - Hemoglobin A1c An individual care plan for diabetes was established and reinforced today.  The patient's status was assessed using clinical findings on exam, labs and diagnostic testing. Patient success at meeting goals based on disease specific evidence-based guidelines and found to be good controlled. Medications were assessed and patient's understanding of the medical issues , including barriers were assessed. Recommend adherence to a diabetic diet, a graduated exercise program, HgbA1c level is checked quarterly, and urine microalbumin performed yearly .  Annual mono-filament sensation testing performed. Lower blood pressure and control hyperlipidemia is important. Get annual eye exams and annual flu shots and smoking cessation discussed.  Self management goals were discussed.  5. Chronic diastolic heart failure ([place]) An individualized care plan was established and reinforced.  The patient's disease status was assessed using clinical  finding son exam today, labs, and/or other diagnostic testing such as x-rays, to determine the patient's success in meeting treatmentgoalsbased on disease-based guidelines and found to beimproving. But not at goal yet. Medications prescriptions no changes Laboratory tests ordered to be performed today include routine. RECOMMENDATIONS: given include see cardiology.  Call physician is patient gains 3 lbs in one day or 5 lbs for one week.  Call for progressive PND, orthopnea or increased pedal edema.  6. CAD in native artery Patient's CAD was assessed using history and physical along with other information to maximize treatment.  Evidence based criteria was use in deciding proper management for this disease process.  Patient's CAD is under good control.therapy continue present medicines.  Continue seeing cardiology.  7. Morbid obesity ([place]) An individualize plan was formulated for obesity using patient history and physical exam to encourage weight loss.  An evidence based program was formulated.  Patient is to cut portion size with meals and to plan physical exercise 3 days a week at least 20 minutes.  Weight watchers and other programs are helpful.  Planned amount of weight loss 10 lbs.    Meds ordered this encounter  Medications  . acetaminophen-codeine (TYLENOL #3) 300-30 MG tablet    Sig: Take 2 tablets by mouth every 8 (eight) hours as needed for moderate pain.    Dispense:  180 tablet    Refill:  0    Orders Placed This Encounter  Procedures  . Comprehensive metabolic panel  . Hemoglobin A1c  . Lipid panel  . CBC with Differential/Platelet      I spent 25 minutes dedicated to the care of this patient on the date of this encounter to include face-to-face time with the patient, as well as: review of old cardiac records and discussion of his CHF and EF which cardiology has not explained.  Follow-up:  Return in about 3 years (around 05/07/2023) for fasting.  An After Visit Summary was  printed and given to the patient.  Reinaldo Meeker, MD [facility] [phone]

## 2020-05-07 DIAGNOSIS — J9611 Chronic respiratory failure with hypoxia: Secondary | ICD-10-CM | POA: Diagnosis not present

## 2020-05-07 LAB — COMPREHENSIVE METABOLIC PANEL
ALT: 17 IU/L (ref 0–44)
AST: 22 IU/L (ref 0–40)
Albumin/Globulin Ratio: 1.6 (ref 1.2–2.2)
Albumin: 4.1 g/dL (ref 3.7–4.7)
Alkaline Phosphatase: 72 IU/L (ref 44–121)
BUN/Creatinine Ratio: 11 (ref 10–24)
BUN: 18 mg/dL (ref 8–27)
Bilirubin Total: 0.5 mg/dL (ref 0.0–1.2)
CO2: 22 mmol/L (ref 20–29)
Calcium: 9 mg/dL (ref 8.6–10.2)
Chloride: 108 mmol/L — ABNORMAL HIGH (ref 96–106)
Creatinine, Ser: 1.59 mg/dL — ABNORMAL HIGH (ref 0.76–1.27)
GFR calc Af Amer: 48 mL/min/{1.73_m2} — ABNORMAL LOW (ref >59)
GFR calc non Af Amer: 42 mL/min/{1.73_m2} — ABNORMAL LOW (ref >59)
Globulin, Total: 2.6 g/dL (ref 1.5–4.5)
Glucose: 93 mg/dL (ref 65–99)
Potassium: 4.2 mmol/L (ref 3.5–5.2)
Sodium: 144 mmol/L (ref 134–144)
Total Protein: 6.7 g/dL (ref 6.0–8.5)

## 2020-05-07 LAB — HEMOGLOBIN A1C
Est. average glucose Bld gHb Est-mCnc: 111 mg/dL
Hgb A1c MFr Bld: 5.5 % (ref 4.8–5.6)

## 2020-05-07 LAB — CBC WITH DIFFERENTIAL/PLATELET
Basophils Absolute: 0 10*3/uL (ref 0.0–0.2)
Basos: 0 %
EOS (ABSOLUTE): 0.1 10*3/uL (ref 0.0–0.4)
Eos: 2 %
Hematocrit: 41.5 % (ref 37.5–51.0)
Hemoglobin: 13.8 g/dL (ref 13.0–17.7)
Immature Grans (Abs): 0 10*3/uL (ref 0.0–0.1)
Immature Granulocytes: 0 %
Lymphocytes Absolute: 2.6 10*3/uL (ref 0.7–3.1)
Lymphs: 37 %
MCH: 30.7 pg (ref 26.6–33.0)
MCHC: 33.3 g/dL (ref 31.5–35.7)
MCV: 92 fL (ref 79–97)
Monocytes Absolute: 0.6 10*3/uL (ref 0.1–0.9)
Monocytes: 8 %
Neutrophils Absolute: 3.6 10*3/uL (ref 1.4–7.0)
Neutrophils: 53 %
Platelets: 103 10*3/uL — ABNORMAL LOW (ref 150–450)
RBC: 4.49 x10E6/uL (ref 4.14–5.80)
RDW: 14.1 % (ref 11.6–15.4)
WBC: 6.9 10*3/uL (ref 3.4–10.8)

## 2020-05-07 LAB — LIPID PANEL
Chol/HDL Ratio: 3.3 ratio (ref 0.0–5.0)
Cholesterol, Total: 143 mg/dL (ref 100–199)
HDL: 43 mg/dL (ref >39)
LDL Chol Calc (NIH): 82 mg/dL (ref 0–99)
Triglycerides: 99 mg/dL (ref 0–149)
VLDL Cholesterol Cal: 18 mg/dL (ref 5–40)

## 2020-05-07 LAB — CARDIOVASCULAR RISK ASSESSMENT

## 2020-05-08 NOTE — Progress Notes (Signed)
Kidney tests still stage 3, liver tests normal, A1c 5.5, Cholesterol normal, CBC good, platelets stable lp

## 2020-05-25 DIAGNOSIS — G4733 Obstructive sleep apnea (adult) (pediatric): Secondary | ICD-10-CM | POA: Diagnosis not present

## 2020-06-01 DIAGNOSIS — I480 Paroxysmal atrial fibrillation: Secondary | ICD-10-CM | POA: Diagnosis not present

## 2020-06-05 DIAGNOSIS — J9611 Chronic respiratory failure with hypoxia: Secondary | ICD-10-CM | POA: Diagnosis not present

## 2020-06-07 DIAGNOSIS — J9611 Chronic respiratory failure with hypoxia: Secondary | ICD-10-CM | POA: Diagnosis not present

## 2020-06-29 DIAGNOSIS — I480 Paroxysmal atrial fibrillation: Secondary | ICD-10-CM | POA: Diagnosis not present

## 2020-06-30 DIAGNOSIS — Z45018 Encounter for adjustment and management of other part of cardiac pacemaker: Secondary | ICD-10-CM | POA: Diagnosis not present

## 2020-06-30 DIAGNOSIS — I480 Paroxysmal atrial fibrillation: Secondary | ICD-10-CM | POA: Diagnosis not present

## 2020-06-30 DIAGNOSIS — Z955 Presence of coronary angioplasty implant and graft: Secondary | ICD-10-CM | POA: Diagnosis not present

## 2020-06-30 DIAGNOSIS — I252 Old myocardial infarction: Secondary | ICD-10-CM | POA: Diagnosis not present

## 2020-06-30 DIAGNOSIS — I452 Bifascicular block: Secondary | ICD-10-CM | POA: Diagnosis not present

## 2020-06-30 DIAGNOSIS — I314 Cardiac tamponade: Secondary | ICD-10-CM | POA: Diagnosis not present

## 2020-06-30 DIAGNOSIS — E785 Hyperlipidemia, unspecified: Secondary | ICD-10-CM | POA: Diagnosis not present

## 2020-06-30 DIAGNOSIS — Z7901 Long term (current) use of anticoagulants: Secondary | ICD-10-CM | POA: Diagnosis not present

## 2020-06-30 DIAGNOSIS — I2510 Atherosclerotic heart disease of native coronary artery without angina pectoris: Secondary | ICD-10-CM | POA: Diagnosis not present

## 2020-07-05 DIAGNOSIS — J9611 Chronic respiratory failure with hypoxia: Secondary | ICD-10-CM | POA: Diagnosis not present

## 2020-07-21 DIAGNOSIS — K8020 Calculus of gallbladder without cholecystitis without obstruction: Secondary | ICD-10-CM | POA: Diagnosis not present

## 2020-07-21 DIAGNOSIS — L03311 Cellulitis of abdominal wall: Secondary | ICD-10-CM | POA: Diagnosis not present

## 2020-07-21 DIAGNOSIS — I352 Nonrheumatic aortic (valve) stenosis with insufficiency: Secondary | ICD-10-CM | POA: Diagnosis not present

## 2020-07-21 DIAGNOSIS — Q430 Meckel's diverticulum (displaced) (hypertrophic): Secondary | ICD-10-CM | POA: Diagnosis not present

## 2020-07-21 DIAGNOSIS — K8010 Calculus of gallbladder with chronic cholecystitis without obstruction: Secondary | ICD-10-CM | POA: Diagnosis not present

## 2020-07-21 DIAGNOSIS — M25551 Pain in right hip: Secondary | ICD-10-CM | POA: Diagnosis not present

## 2020-07-21 DIAGNOSIS — R42 Dizziness and giddiness: Secondary | ICD-10-CM | POA: Diagnosis not present

## 2020-07-21 DIAGNOSIS — R918 Other nonspecific abnormal finding of lung field: Secondary | ICD-10-CM | POA: Diagnosis not present

## 2020-07-21 DIAGNOSIS — I361 Nonrheumatic tricuspid (valve) insufficiency: Secondary | ICD-10-CM | POA: Diagnosis not present

## 2020-07-21 DIAGNOSIS — N19 Unspecified kidney failure: Secondary | ICD-10-CM | POA: Diagnosis not present

## 2020-07-21 DIAGNOSIS — R1110 Vomiting, unspecified: Secondary | ICD-10-CM | POA: Diagnosis not present

## 2020-07-21 DIAGNOSIS — I4891 Unspecified atrial fibrillation: Secondary | ICD-10-CM | POA: Diagnosis not present

## 2020-07-21 DIAGNOSIS — J811 Chronic pulmonary edema: Secondary | ICD-10-CM | POA: Diagnosis not present

## 2020-07-21 DIAGNOSIS — S299XXA Unspecified injury of thorax, initial encounter: Secondary | ICD-10-CM | POA: Diagnosis not present

## 2020-07-21 DIAGNOSIS — Z4659 Encounter for fitting and adjustment of other gastrointestinal appliance and device: Secondary | ICD-10-CM | POA: Diagnosis not present

## 2020-07-21 DIAGNOSIS — B952 Enterococcus as the cause of diseases classified elsewhere: Secondary | ICD-10-CM | POA: Diagnosis not present

## 2020-07-21 DIAGNOSIS — R7989 Other specified abnormal findings of blood chemistry: Secondary | ICD-10-CM | POA: Diagnosis not present

## 2020-07-21 DIAGNOSIS — E1122 Type 2 diabetes mellitus with diabetic chronic kidney disease: Secondary | ICD-10-CM | POA: Diagnosis not present

## 2020-07-21 DIAGNOSIS — I82612 Acute embolism and thrombosis of superficial veins of left upper extremity: Secondary | ICD-10-CM | POA: Diagnosis not present

## 2020-07-21 DIAGNOSIS — J9611 Chronic respiratory failure with hypoxia: Secondary | ICD-10-CM | POA: Diagnosis not present

## 2020-07-21 DIAGNOSIS — J9690 Respiratory failure, unspecified, unspecified whether with hypoxia or hypercapnia: Secondary | ICD-10-CM | POA: Diagnosis not present

## 2020-07-21 DIAGNOSIS — E860 Dehydration: Secondary | ICD-10-CM | POA: Diagnosis not present

## 2020-07-21 DIAGNOSIS — K5900 Constipation, unspecified: Secondary | ICD-10-CM | POA: Diagnosis not present

## 2020-07-21 DIAGNOSIS — I82409 Acute embolism and thrombosis of unspecified deep veins of unspecified lower extremity: Secondary | ICD-10-CM | POA: Insufficient documentation

## 2020-07-21 DIAGNOSIS — J9601 Acute respiratory failure with hypoxia: Secondary | ICD-10-CM | POA: Diagnosis not present

## 2020-07-21 DIAGNOSIS — J95821 Acute postprocedural respiratory failure: Secondary | ICD-10-CM | POA: Diagnosis not present

## 2020-07-21 DIAGNOSIS — K6389 Other specified diseases of intestine: Secondary | ICD-10-CM | POA: Diagnosis not present

## 2020-07-21 DIAGNOSIS — N179 Acute kidney failure, unspecified: Secondary | ICD-10-CM | POA: Diagnosis not present

## 2020-07-21 DIAGNOSIS — K828 Other specified diseases of gallbladder: Secondary | ICD-10-CM | POA: Diagnosis not present

## 2020-07-21 DIAGNOSIS — I429 Cardiomyopathy, unspecified: Secondary | ICD-10-CM | POA: Insufficient documentation

## 2020-07-21 DIAGNOSIS — Z87891 Personal history of nicotine dependence: Secondary | ICD-10-CM | POA: Diagnosis not present

## 2020-07-21 DIAGNOSIS — R188 Other ascites: Secondary | ICD-10-CM | POA: Diagnosis not present

## 2020-07-21 DIAGNOSIS — Z9049 Acquired absence of other specified parts of digestive tract: Secondary | ICD-10-CM | POA: Diagnosis not present

## 2020-07-21 DIAGNOSIS — Z950 Presence of cardiac pacemaker: Secondary | ICD-10-CM | POA: Diagnosis not present

## 2020-07-21 DIAGNOSIS — R579 Shock, unspecified: Secondary | ICD-10-CM | POA: Diagnosis not present

## 2020-07-21 DIAGNOSIS — Z5331 Laparoscopic surgical procedure converted to open procedure: Secondary | ICD-10-CM | POA: Diagnosis not present

## 2020-07-21 DIAGNOSIS — R9431 Abnormal electrocardiogram [ECG] [EKG]: Secondary | ICD-10-CM | POA: Diagnosis not present

## 2020-07-21 DIAGNOSIS — K56699 Other intestinal obstruction unspecified as to partial versus complete obstruction: Secondary | ICD-10-CM | POA: Diagnosis not present

## 2020-07-21 DIAGNOSIS — I219 Acute myocardial infarction, unspecified: Secondary | ICD-10-CM | POA: Diagnosis not present

## 2020-07-21 DIAGNOSIS — I21A1 Myocardial infarction type 2: Secondary | ICD-10-CM | POA: Diagnosis not present

## 2020-07-21 DIAGNOSIS — K56609 Unspecified intestinal obstruction, unspecified as to partial versus complete obstruction: Secondary | ICD-10-CM | POA: Diagnosis not present

## 2020-07-21 DIAGNOSIS — E872 Acidosis: Secondary | ICD-10-CM | POA: Diagnosis not present

## 2020-07-21 DIAGNOSIS — Z01818 Encounter for other preprocedural examination: Secondary | ICD-10-CM | POA: Diagnosis not present

## 2020-07-21 DIAGNOSIS — E871 Hypo-osmolality and hyponatremia: Secondary | ICD-10-CM | POA: Diagnosis not present

## 2020-07-21 DIAGNOSIS — Z955 Presence of coronary angioplasty implant and graft: Secondary | ICD-10-CM | POA: Diagnosis not present

## 2020-07-21 DIAGNOSIS — R6521 Severe sepsis with septic shock: Secondary | ICD-10-CM | POA: Diagnosis not present

## 2020-07-21 DIAGNOSIS — K631 Perforation of intestine (nontraumatic): Secondary | ICD-10-CM | POA: Insufficient documentation

## 2020-07-21 DIAGNOSIS — R110 Nausea: Secondary | ICD-10-CM | POA: Diagnosis not present

## 2020-07-21 DIAGNOSIS — I517 Cardiomegaly: Secondary | ICD-10-CM | POA: Diagnosis not present

## 2020-07-21 DIAGNOSIS — I340 Nonrheumatic mitral (valve) insufficiency: Secondary | ICD-10-CM | POA: Diagnosis not present

## 2020-07-21 DIAGNOSIS — E43 Unspecified severe protein-calorie malnutrition: Secondary | ICD-10-CM | POA: Diagnosis not present

## 2020-07-21 DIAGNOSIS — I82451 Acute embolism and thrombosis of right peroneal vein: Secondary | ICD-10-CM | POA: Diagnosis not present

## 2020-07-21 DIAGNOSIS — Z9911 Dependence on respirator [ventilator] status: Secondary | ICD-10-CM | POA: Diagnosis not present

## 2020-07-21 DIAGNOSIS — N183 Chronic kidney disease, stage 3 unspecified: Secondary | ICD-10-CM | POA: Diagnosis not present

## 2020-07-21 DIAGNOSIS — J90 Pleural effusion, not elsewhere classified: Secondary | ICD-10-CM | POA: Diagnosis not present

## 2020-07-21 DIAGNOSIS — Z992 Dependence on renal dialysis: Secondary | ICD-10-CM | POA: Diagnosis not present

## 2020-07-21 DIAGNOSIS — I82452 Acute embolism and thrombosis of left peroneal vein: Secondary | ICD-10-CM | POA: Diagnosis not present

## 2020-07-21 DIAGNOSIS — R1030 Lower abdominal pain, unspecified: Secondary | ICD-10-CM | POA: Diagnosis not present

## 2020-07-21 DIAGNOSIS — D373 Neoplasm of uncertain behavior of appendix: Secondary | ICD-10-CM | POA: Diagnosis not present

## 2020-07-21 DIAGNOSIS — J9811 Atelectasis: Secondary | ICD-10-CM | POA: Diagnosis not present

## 2020-07-21 DIAGNOSIS — A419 Sepsis, unspecified organism: Secondary | ICD-10-CM | POA: Diagnosis not present

## 2020-07-21 DIAGNOSIS — K56691 Other complete intestinal obstruction: Secondary | ICD-10-CM | POA: Insufficient documentation

## 2020-07-21 DIAGNOSIS — K565 Intestinal adhesions [bands], unspecified as to partial versus complete obstruction: Secondary | ICD-10-CM | POA: Diagnosis not present

## 2020-07-21 DIAGNOSIS — I4821 Permanent atrial fibrillation: Secondary | ICD-10-CM | POA: Diagnosis not present

## 2020-07-21 DIAGNOSIS — J9600 Acute respiratory failure, unspecified whether with hypoxia or hypercapnia: Secondary | ICD-10-CM | POA: Diagnosis not present

## 2020-07-21 DIAGNOSIS — N186 End stage renal disease: Secondary | ICD-10-CM | POA: Diagnosis not present

## 2020-07-21 DIAGNOSIS — S3993XA Unspecified injury of pelvis, initial encounter: Secondary | ICD-10-CM | POA: Diagnosis not present

## 2020-07-21 DIAGNOSIS — R34 Anuria and oliguria: Secondary | ICD-10-CM | POA: Diagnosis not present

## 2020-07-21 DIAGNOSIS — I5030 Unspecified diastolic (congestive) heart failure: Secondary | ICD-10-CM | POA: Diagnosis not present

## 2020-07-21 DIAGNOSIS — Z452 Encounter for adjustment and management of vascular access device: Secondary | ICD-10-CM | POA: Diagnosis not present

## 2020-07-21 DIAGNOSIS — I480 Paroxysmal atrial fibrillation: Secondary | ICD-10-CM | POA: Diagnosis not present

## 2020-07-21 DIAGNOSIS — I132 Hypertensive heart and chronic kidney disease with heart failure and with stage 5 chronic kidney disease, or end stage renal disease: Secondary | ICD-10-CM | POA: Diagnosis not present

## 2020-07-21 DIAGNOSIS — R410 Disorientation, unspecified: Secondary | ICD-10-CM | POA: Diagnosis not present

## 2020-07-21 DIAGNOSIS — R570 Cardiogenic shock: Secondary | ICD-10-CM | POA: Diagnosis not present

## 2020-07-21 DIAGNOSIS — Z20822 Contact with and (suspected) exposure to covid-19: Secondary | ICD-10-CM | POA: Diagnosis not present

## 2020-07-21 DIAGNOSIS — E1129 Type 2 diabetes mellitus with other diabetic kidney complication: Secondary | ICD-10-CM | POA: Diagnosis not present

## 2020-07-21 DIAGNOSIS — I214 Non-ST elevation (NSTEMI) myocardial infarction: Secondary | ICD-10-CM | POA: Diagnosis not present

## 2020-07-21 DIAGNOSIS — R112 Nausea with vomiting, unspecified: Secondary | ICD-10-CM | POA: Diagnosis not present

## 2020-07-21 DIAGNOSIS — R0689 Other abnormalities of breathing: Secondary | ICD-10-CM | POA: Diagnosis not present

## 2020-07-21 DIAGNOSIS — I499 Cardiac arrhythmia, unspecified: Secondary | ICD-10-CM | POA: Diagnosis not present

## 2020-07-21 DIAGNOSIS — T8142XD Infection following a procedure, deep incisional surgical site, subsequent encounter: Secondary | ICD-10-CM | POA: Diagnosis not present

## 2020-07-21 DIAGNOSIS — E119 Type 2 diabetes mellitus without complications: Secondary | ICD-10-CM | POA: Diagnosis not present

## 2020-07-21 DIAGNOSIS — I255 Ischemic cardiomyopathy: Secondary | ICD-10-CM | POA: Diagnosis not present

## 2020-07-21 DIAGNOSIS — Z48815 Encounter for surgical aftercare following surgery on the digestive system: Secondary | ICD-10-CM | POA: Diagnosis not present

## 2020-07-21 DIAGNOSIS — I5020 Unspecified systolic (congestive) heart failure: Secondary | ICD-10-CM | POA: Diagnosis not present

## 2020-07-21 DIAGNOSIS — K5730 Diverticulosis of large intestine without perforation or abscess without bleeding: Secondary | ICD-10-CM | POA: Diagnosis not present

## 2020-07-21 DIAGNOSIS — K8063 Calculus of gallbladder and bile duct with acute cholecystitis with obstruction: Secondary | ICD-10-CM | POA: Diagnosis not present

## 2020-07-21 DIAGNOSIS — N2581 Secondary hyperparathyroidism of renal origin: Secondary | ICD-10-CM | POA: Diagnosis not present

## 2020-07-21 DIAGNOSIS — I959 Hypotension, unspecified: Secondary | ICD-10-CM | POA: Diagnosis not present

## 2020-07-21 DIAGNOSIS — I2510 Atherosclerotic heart disease of native coronary artery without angina pectoris: Secondary | ICD-10-CM | POA: Diagnosis not present

## 2020-07-21 DIAGNOSIS — Z4682 Encounter for fitting and adjustment of non-vascular catheter: Secondary | ICD-10-CM | POA: Diagnosis not present

## 2020-07-21 DIAGNOSIS — R1111 Vomiting without nausea: Secondary | ICD-10-CM | POA: Diagnosis not present

## 2020-07-22 DIAGNOSIS — R0689 Other abnormalities of breathing: Secondary | ICD-10-CM | POA: Diagnosis not present

## 2020-07-22 DIAGNOSIS — R7989 Other specified abnormal findings of blood chemistry: Secondary | ICD-10-CM | POA: Diagnosis not present

## 2020-07-22 DIAGNOSIS — D373 Neoplasm of uncertain behavior of appendix: Secondary | ICD-10-CM | POA: Diagnosis not present

## 2020-07-22 DIAGNOSIS — K8010 Calculus of gallbladder with chronic cholecystitis without obstruction: Secondary | ICD-10-CM | POA: Diagnosis not present

## 2020-07-22 DIAGNOSIS — I517 Cardiomegaly: Secondary | ICD-10-CM | POA: Diagnosis not present

## 2020-07-22 DIAGNOSIS — K56609 Unspecified intestinal obstruction, unspecified as to partial versus complete obstruction: Secondary | ICD-10-CM | POA: Diagnosis not present

## 2020-07-22 DIAGNOSIS — R6521 Severe sepsis with septic shock: Secondary | ICD-10-CM | POA: Diagnosis not present

## 2020-07-22 DIAGNOSIS — R34 Anuria and oliguria: Secondary | ICD-10-CM | POA: Diagnosis not present

## 2020-07-22 DIAGNOSIS — N183 Chronic kidney disease, stage 3 unspecified: Secondary | ICD-10-CM | POA: Diagnosis not present

## 2020-07-22 DIAGNOSIS — Z452 Encounter for adjustment and management of vascular access device: Secondary | ICD-10-CM | POA: Diagnosis not present

## 2020-07-22 DIAGNOSIS — N19 Unspecified kidney failure: Secondary | ICD-10-CM | POA: Diagnosis not present

## 2020-07-22 DIAGNOSIS — I959 Hypotension, unspecified: Secondary | ICD-10-CM | POA: Diagnosis not present

## 2020-07-22 DIAGNOSIS — R579 Shock, unspecified: Secondary | ICD-10-CM | POA: Diagnosis not present

## 2020-07-22 DIAGNOSIS — J9811 Atelectasis: Secondary | ICD-10-CM | POA: Diagnosis not present

## 2020-07-22 DIAGNOSIS — J9600 Acute respiratory failure, unspecified whether with hypoxia or hypercapnia: Secondary | ICD-10-CM | POA: Diagnosis not present

## 2020-07-22 DIAGNOSIS — E872 Acidosis: Secondary | ICD-10-CM | POA: Diagnosis not present

## 2020-07-22 DIAGNOSIS — I480 Paroxysmal atrial fibrillation: Secondary | ICD-10-CM | POA: Diagnosis not present

## 2020-07-22 DIAGNOSIS — Z01818 Encounter for other preprocedural examination: Secondary | ICD-10-CM | POA: Diagnosis not present

## 2020-07-22 DIAGNOSIS — K6389 Other specified diseases of intestine: Secondary | ICD-10-CM | POA: Diagnosis not present

## 2020-07-22 DIAGNOSIS — N179 Acute kidney failure, unspecified: Secondary | ICD-10-CM | POA: Diagnosis not present

## 2020-07-23 DIAGNOSIS — K8010 Calculus of gallbladder with chronic cholecystitis without obstruction: Secondary | ICD-10-CM | POA: Diagnosis not present

## 2020-07-23 DIAGNOSIS — R6521 Severe sepsis with septic shock: Secondary | ICD-10-CM | POA: Diagnosis not present

## 2020-07-23 DIAGNOSIS — I959 Hypotension, unspecified: Secondary | ICD-10-CM | POA: Diagnosis not present

## 2020-07-23 DIAGNOSIS — Z9911 Dependence on respirator [ventilator] status: Secondary | ICD-10-CM | POA: Diagnosis not present

## 2020-07-23 DIAGNOSIS — J9600 Acute respiratory failure, unspecified whether with hypoxia or hypercapnia: Secondary | ICD-10-CM | POA: Diagnosis not present

## 2020-07-23 DIAGNOSIS — K56609 Unspecified intestinal obstruction, unspecified as to partial versus complete obstruction: Secondary | ICD-10-CM | POA: Diagnosis not present

## 2020-07-23 DIAGNOSIS — N179 Acute kidney failure, unspecified: Secondary | ICD-10-CM | POA: Diagnosis not present

## 2020-07-23 DIAGNOSIS — Z4682 Encounter for fitting and adjustment of non-vascular catheter: Secondary | ICD-10-CM | POA: Diagnosis not present

## 2020-07-23 DIAGNOSIS — N183 Chronic kidney disease, stage 3 unspecified: Secondary | ICD-10-CM | POA: Diagnosis not present

## 2020-07-24 DIAGNOSIS — K56609 Unspecified intestinal obstruction, unspecified as to partial versus complete obstruction: Secondary | ICD-10-CM | POA: Diagnosis not present

## 2020-07-24 DIAGNOSIS — I499 Cardiac arrhythmia, unspecified: Secondary | ICD-10-CM | POA: Diagnosis not present

## 2020-07-24 DIAGNOSIS — J9600 Acute respiratory failure, unspecified whether with hypoxia or hypercapnia: Secondary | ICD-10-CM | POA: Diagnosis not present

## 2020-07-24 DIAGNOSIS — R6521 Severe sepsis with septic shock: Secondary | ICD-10-CM | POA: Diagnosis not present

## 2020-07-24 DIAGNOSIS — N183 Chronic kidney disease, stage 3 unspecified: Secondary | ICD-10-CM | POA: Diagnosis not present

## 2020-07-24 DIAGNOSIS — Z9911 Dependence on respirator [ventilator] status: Secondary | ICD-10-CM | POA: Diagnosis not present

## 2020-07-24 DIAGNOSIS — N19 Unspecified kidney failure: Secondary | ICD-10-CM | POA: Diagnosis not present

## 2020-07-24 DIAGNOSIS — I959 Hypotension, unspecified: Secondary | ICD-10-CM | POA: Diagnosis not present

## 2020-07-24 DIAGNOSIS — N179 Acute kidney failure, unspecified: Secondary | ICD-10-CM | POA: Diagnosis not present

## 2020-07-24 DIAGNOSIS — K8010 Calculus of gallbladder with chronic cholecystitis without obstruction: Secondary | ICD-10-CM | POA: Diagnosis not present

## 2020-07-24 DIAGNOSIS — R579 Shock, unspecified: Secondary | ICD-10-CM | POA: Diagnosis not present

## 2020-07-25 DIAGNOSIS — J95821 Acute postprocedural respiratory failure: Secondary | ICD-10-CM | POA: Diagnosis not present

## 2020-07-25 DIAGNOSIS — K8010 Calculus of gallbladder with chronic cholecystitis without obstruction: Secondary | ICD-10-CM | POA: Diagnosis not present

## 2020-07-25 DIAGNOSIS — K56609 Unspecified intestinal obstruction, unspecified as to partial versus complete obstruction: Secondary | ICD-10-CM | POA: Diagnosis not present

## 2020-07-25 DIAGNOSIS — D373 Neoplasm of uncertain behavior of appendix: Secondary | ICD-10-CM | POA: Diagnosis not present

## 2020-07-25 DIAGNOSIS — I959 Hypotension, unspecified: Secondary | ICD-10-CM | POA: Diagnosis not present

## 2020-07-25 DIAGNOSIS — N183 Chronic kidney disease, stage 3 unspecified: Secondary | ICD-10-CM | POA: Diagnosis not present

## 2020-07-25 DIAGNOSIS — R918 Other nonspecific abnormal finding of lung field: Secondary | ICD-10-CM | POA: Diagnosis not present

## 2020-07-25 DIAGNOSIS — R6521 Severe sepsis with septic shock: Secondary | ICD-10-CM | POA: Diagnosis not present

## 2020-07-25 DIAGNOSIS — Z9911 Dependence on respirator [ventilator] status: Secondary | ICD-10-CM | POA: Diagnosis not present

## 2020-07-25 DIAGNOSIS — N179 Acute kidney failure, unspecified: Secondary | ICD-10-CM | POA: Diagnosis not present

## 2020-07-25 DIAGNOSIS — J9600 Acute respiratory failure, unspecified whether with hypoxia or hypercapnia: Secondary | ICD-10-CM | POA: Diagnosis not present

## 2020-07-26 DIAGNOSIS — J9600 Acute respiratory failure, unspecified whether with hypoxia or hypercapnia: Secondary | ICD-10-CM | POA: Diagnosis not present

## 2020-07-26 DIAGNOSIS — J811 Chronic pulmonary edema: Secondary | ICD-10-CM | POA: Diagnosis not present

## 2020-07-26 DIAGNOSIS — R6521 Severe sepsis with septic shock: Secondary | ICD-10-CM | POA: Diagnosis not present

## 2020-07-26 DIAGNOSIS — N183 Chronic kidney disease, stage 3 unspecified: Secondary | ICD-10-CM | POA: Diagnosis not present

## 2020-07-26 DIAGNOSIS — Z4682 Encounter for fitting and adjustment of non-vascular catheter: Secondary | ICD-10-CM | POA: Diagnosis not present

## 2020-07-26 DIAGNOSIS — N179 Acute kidney failure, unspecified: Secondary | ICD-10-CM | POA: Diagnosis not present

## 2020-07-26 DIAGNOSIS — I959 Hypotension, unspecified: Secondary | ICD-10-CM | POA: Diagnosis not present

## 2020-07-27 DIAGNOSIS — N19 Unspecified kidney failure: Secondary | ICD-10-CM | POA: Diagnosis not present

## 2020-07-27 DIAGNOSIS — E872 Acidosis: Secondary | ICD-10-CM | POA: Diagnosis not present

## 2020-07-27 DIAGNOSIS — J9600 Acute respiratory failure, unspecified whether with hypoxia or hypercapnia: Secondary | ICD-10-CM | POA: Diagnosis not present

## 2020-07-27 DIAGNOSIS — Z9049 Acquired absence of other specified parts of digestive tract: Secondary | ICD-10-CM | POA: Diagnosis not present

## 2020-07-27 DIAGNOSIS — J90 Pleural effusion, not elsewhere classified: Secondary | ICD-10-CM | POA: Diagnosis not present

## 2020-07-27 DIAGNOSIS — R918 Other nonspecific abnormal finding of lung field: Secondary | ICD-10-CM | POA: Diagnosis not present

## 2020-07-27 DIAGNOSIS — N183 Chronic kidney disease, stage 3 unspecified: Secondary | ICD-10-CM | POA: Diagnosis not present

## 2020-07-27 DIAGNOSIS — J9811 Atelectasis: Secondary | ICD-10-CM | POA: Diagnosis not present

## 2020-07-27 DIAGNOSIS — I959 Hypotension, unspecified: Secondary | ICD-10-CM | POA: Diagnosis not present

## 2020-07-27 DIAGNOSIS — R6521 Severe sepsis with septic shock: Secondary | ICD-10-CM | POA: Diagnosis not present

## 2020-07-27 DIAGNOSIS — R579 Shock, unspecified: Secondary | ICD-10-CM | POA: Diagnosis not present

## 2020-07-27 DIAGNOSIS — R188 Other ascites: Secondary | ICD-10-CM | POA: Diagnosis not present

## 2020-07-27 DIAGNOSIS — N179 Acute kidney failure, unspecified: Secondary | ICD-10-CM | POA: Diagnosis not present

## 2020-07-28 DIAGNOSIS — J9690 Respiratory failure, unspecified, unspecified whether with hypoxia or hypercapnia: Secondary | ICD-10-CM | POA: Diagnosis not present

## 2020-07-28 DIAGNOSIS — N179 Acute kidney failure, unspecified: Secondary | ICD-10-CM | POA: Diagnosis not present

## 2020-07-28 DIAGNOSIS — J9600 Acute respiratory failure, unspecified whether with hypoxia or hypercapnia: Secondary | ICD-10-CM | POA: Diagnosis not present

## 2020-07-28 DIAGNOSIS — I959 Hypotension, unspecified: Secondary | ICD-10-CM | POA: Diagnosis not present

## 2020-07-28 DIAGNOSIS — R6521 Severe sepsis with septic shock: Secondary | ICD-10-CM | POA: Diagnosis not present

## 2020-07-28 DIAGNOSIS — K56609 Unspecified intestinal obstruction, unspecified as to partial versus complete obstruction: Secondary | ICD-10-CM | POA: Diagnosis not present

## 2020-07-28 DIAGNOSIS — J9811 Atelectasis: Secondary | ICD-10-CM | POA: Diagnosis not present

## 2020-07-28 DIAGNOSIS — K8010 Calculus of gallbladder with chronic cholecystitis without obstruction: Secondary | ICD-10-CM | POA: Diagnosis not present

## 2020-07-28 DIAGNOSIS — Z9049 Acquired absence of other specified parts of digestive tract: Secondary | ICD-10-CM | POA: Diagnosis not present

## 2020-07-28 DIAGNOSIS — N183 Chronic kidney disease, stage 3 unspecified: Secondary | ICD-10-CM | POA: Diagnosis not present

## 2020-07-29 DIAGNOSIS — N179 Acute kidney failure, unspecified: Secondary | ICD-10-CM | POA: Diagnosis not present

## 2020-07-29 DIAGNOSIS — N183 Chronic kidney disease, stage 3 unspecified: Secondary | ICD-10-CM | POA: Diagnosis not present

## 2020-07-29 DIAGNOSIS — R918 Other nonspecific abnormal finding of lung field: Secondary | ICD-10-CM | POA: Diagnosis not present

## 2020-07-29 DIAGNOSIS — R6521 Severe sepsis with septic shock: Secondary | ICD-10-CM | POA: Diagnosis not present

## 2020-07-29 DIAGNOSIS — J9600 Acute respiratory failure, unspecified whether with hypoxia or hypercapnia: Secondary | ICD-10-CM | POA: Diagnosis not present

## 2020-07-29 DIAGNOSIS — I959 Hypotension, unspecified: Secondary | ICD-10-CM | POA: Diagnosis not present

## 2020-07-30 DIAGNOSIS — N179 Acute kidney failure, unspecified: Secondary | ICD-10-CM | POA: Diagnosis not present

## 2020-07-30 DIAGNOSIS — R6521 Severe sepsis with septic shock: Secondary | ICD-10-CM | POA: Diagnosis not present

## 2020-07-30 DIAGNOSIS — J9600 Acute respiratory failure, unspecified whether with hypoxia or hypercapnia: Secondary | ICD-10-CM | POA: Diagnosis not present

## 2020-07-30 DIAGNOSIS — I480 Paroxysmal atrial fibrillation: Secondary | ICD-10-CM | POA: Diagnosis not present

## 2020-07-30 DIAGNOSIS — N183 Chronic kidney disease, stage 3 unspecified: Secondary | ICD-10-CM | POA: Diagnosis not present

## 2020-07-30 DIAGNOSIS — K56609 Unspecified intestinal obstruction, unspecified as to partial versus complete obstruction: Secondary | ICD-10-CM | POA: Diagnosis not present

## 2020-07-30 DIAGNOSIS — I959 Hypotension, unspecified: Secondary | ICD-10-CM | POA: Diagnosis not present

## 2020-07-30 DIAGNOSIS — S299XXA Unspecified injury of thorax, initial encounter: Secondary | ICD-10-CM | POA: Diagnosis not present

## 2020-07-31 DIAGNOSIS — J90 Pleural effusion, not elsewhere classified: Secondary | ICD-10-CM | POA: Diagnosis not present

## 2020-07-31 DIAGNOSIS — N179 Acute kidney failure, unspecified: Secondary | ICD-10-CM | POA: Diagnosis not present

## 2020-07-31 DIAGNOSIS — K56609 Unspecified intestinal obstruction, unspecified as to partial versus complete obstruction: Secondary | ICD-10-CM | POA: Diagnosis not present

## 2020-07-31 DIAGNOSIS — R6521 Severe sepsis with septic shock: Secondary | ICD-10-CM | POA: Diagnosis not present

## 2020-07-31 DIAGNOSIS — I959 Hypotension, unspecified: Secondary | ICD-10-CM | POA: Diagnosis not present

## 2020-07-31 DIAGNOSIS — N183 Chronic kidney disease, stage 3 unspecified: Secondary | ICD-10-CM | POA: Diagnosis not present

## 2020-07-31 DIAGNOSIS — R0689 Other abnormalities of breathing: Secondary | ICD-10-CM | POA: Diagnosis not present

## 2020-07-31 DIAGNOSIS — D373 Neoplasm of uncertain behavior of appendix: Secondary | ICD-10-CM | POA: Diagnosis not present

## 2020-07-31 DIAGNOSIS — R918 Other nonspecific abnormal finding of lung field: Secondary | ICD-10-CM | POA: Diagnosis not present

## 2020-07-31 DIAGNOSIS — J9600 Acute respiratory failure, unspecified whether with hypoxia or hypercapnia: Secondary | ICD-10-CM | POA: Diagnosis not present

## 2020-07-31 DIAGNOSIS — J95821 Acute postprocedural respiratory failure: Secondary | ICD-10-CM | POA: Diagnosis not present

## 2020-08-01 DIAGNOSIS — N179 Acute kidney failure, unspecified: Secondary | ICD-10-CM | POA: Diagnosis not present

## 2020-08-01 DIAGNOSIS — J9600 Acute respiratory failure, unspecified whether with hypoxia or hypercapnia: Secondary | ICD-10-CM | POA: Diagnosis not present

## 2020-08-01 DIAGNOSIS — S299XXA Unspecified injury of thorax, initial encounter: Secondary | ICD-10-CM | POA: Diagnosis not present

## 2020-08-01 DIAGNOSIS — J90 Pleural effusion, not elsewhere classified: Secondary | ICD-10-CM | POA: Diagnosis not present

## 2020-08-01 DIAGNOSIS — I959 Hypotension, unspecified: Secondary | ICD-10-CM | POA: Diagnosis not present

## 2020-08-01 DIAGNOSIS — N183 Chronic kidney disease, stage 3 unspecified: Secondary | ICD-10-CM | POA: Diagnosis not present

## 2020-08-01 DIAGNOSIS — K56609 Unspecified intestinal obstruction, unspecified as to partial versus complete obstruction: Secondary | ICD-10-CM | POA: Diagnosis not present

## 2020-08-01 DIAGNOSIS — R6521 Severe sepsis with septic shock: Secondary | ICD-10-CM | POA: Diagnosis not present

## 2020-08-02 DIAGNOSIS — R6521 Severe sepsis with septic shock: Secondary | ICD-10-CM | POA: Diagnosis not present

## 2020-08-02 DIAGNOSIS — J9600 Acute respiratory failure, unspecified whether with hypoxia or hypercapnia: Secondary | ICD-10-CM | POA: Diagnosis not present

## 2020-08-02 DIAGNOSIS — I959 Hypotension, unspecified: Secondary | ICD-10-CM | POA: Diagnosis not present

## 2020-08-02 DIAGNOSIS — J90 Pleural effusion, not elsewhere classified: Secondary | ICD-10-CM | POA: Diagnosis not present

## 2020-08-02 DIAGNOSIS — N183 Chronic kidney disease, stage 3 unspecified: Secondary | ICD-10-CM | POA: Diagnosis not present

## 2020-08-02 DIAGNOSIS — N179 Acute kidney failure, unspecified: Secondary | ICD-10-CM | POA: Diagnosis not present

## 2020-08-03 DIAGNOSIS — J9600 Acute respiratory failure, unspecified whether with hypoxia or hypercapnia: Secondary | ICD-10-CM | POA: Diagnosis not present

## 2020-08-03 DIAGNOSIS — N179 Acute kidney failure, unspecified: Secondary | ICD-10-CM | POA: Diagnosis not present

## 2020-08-03 DIAGNOSIS — J9611 Chronic respiratory failure with hypoxia: Secondary | ICD-10-CM | POA: Diagnosis not present

## 2020-08-03 DIAGNOSIS — J90 Pleural effusion, not elsewhere classified: Secondary | ICD-10-CM | POA: Diagnosis not present

## 2020-08-03 DIAGNOSIS — R6521 Severe sepsis with septic shock: Secondary | ICD-10-CM | POA: Diagnosis not present

## 2020-08-03 DIAGNOSIS — I959 Hypotension, unspecified: Secondary | ICD-10-CM | POA: Diagnosis not present

## 2020-08-03 DIAGNOSIS — N183 Chronic kidney disease, stage 3 unspecified: Secondary | ICD-10-CM | POA: Diagnosis not present

## 2020-08-04 DIAGNOSIS — I959 Hypotension, unspecified: Secondary | ICD-10-CM | POA: Diagnosis not present

## 2020-08-04 DIAGNOSIS — J9600 Acute respiratory failure, unspecified whether with hypoxia or hypercapnia: Secondary | ICD-10-CM | POA: Diagnosis not present

## 2020-08-04 DIAGNOSIS — R918 Other nonspecific abnormal finding of lung field: Secondary | ICD-10-CM | POA: Diagnosis not present

## 2020-08-04 DIAGNOSIS — N179 Acute kidney failure, unspecified: Secondary | ICD-10-CM | POA: Diagnosis not present

## 2020-08-04 DIAGNOSIS — R6521 Severe sepsis with septic shock: Secondary | ICD-10-CM | POA: Diagnosis not present

## 2020-08-04 DIAGNOSIS — N183 Chronic kidney disease, stage 3 unspecified: Secondary | ICD-10-CM | POA: Diagnosis not present

## 2020-08-05 ENCOUNTER — Ambulatory Visit: Payer: Medicare Other | Admitting: Legal Medicine

## 2020-08-05 DIAGNOSIS — R6521 Severe sepsis with septic shock: Secondary | ICD-10-CM | POA: Diagnosis not present

## 2020-08-05 DIAGNOSIS — I959 Hypotension, unspecified: Secondary | ICD-10-CM | POA: Diagnosis not present

## 2020-08-05 DIAGNOSIS — N183 Chronic kidney disease, stage 3 unspecified: Secondary | ICD-10-CM | POA: Diagnosis not present

## 2020-08-05 DIAGNOSIS — K56609 Unspecified intestinal obstruction, unspecified as to partial versus complete obstruction: Secondary | ICD-10-CM | POA: Diagnosis not present

## 2020-08-05 DIAGNOSIS — S299XXA Unspecified injury of thorax, initial encounter: Secondary | ICD-10-CM | POA: Diagnosis not present

## 2020-08-05 DIAGNOSIS — J9611 Chronic respiratory failure with hypoxia: Secondary | ICD-10-CM | POA: Diagnosis not present

## 2020-08-05 DIAGNOSIS — J9600 Acute respiratory failure, unspecified whether with hypoxia or hypercapnia: Secondary | ICD-10-CM | POA: Diagnosis not present

## 2020-08-05 DIAGNOSIS — N179 Acute kidney failure, unspecified: Secondary | ICD-10-CM | POA: Diagnosis not present

## 2020-08-05 DIAGNOSIS — J90 Pleural effusion, not elsewhere classified: Secondary | ICD-10-CM | POA: Diagnosis not present

## 2020-08-05 DIAGNOSIS — I480 Paroxysmal atrial fibrillation: Secondary | ICD-10-CM | POA: Diagnosis not present

## 2020-08-06 DIAGNOSIS — J9600 Acute respiratory failure, unspecified whether with hypoxia or hypercapnia: Secondary | ICD-10-CM | POA: Diagnosis not present

## 2020-08-06 DIAGNOSIS — Z992 Dependence on renal dialysis: Secondary | ICD-10-CM | POA: Diagnosis not present

## 2020-08-06 DIAGNOSIS — N186 End stage renal disease: Secondary | ICD-10-CM | POA: Diagnosis not present

## 2020-08-06 DIAGNOSIS — R6521 Severe sepsis with septic shock: Secondary | ICD-10-CM | POA: Diagnosis not present

## 2020-08-06 DIAGNOSIS — I959 Hypotension, unspecified: Secondary | ICD-10-CM | POA: Diagnosis not present

## 2020-08-06 DIAGNOSIS — R918 Other nonspecific abnormal finding of lung field: Secondary | ICD-10-CM | POA: Diagnosis not present

## 2020-08-06 DIAGNOSIS — N183 Chronic kidney disease, stage 3 unspecified: Secondary | ICD-10-CM | POA: Diagnosis not present

## 2020-08-06 DIAGNOSIS — J90 Pleural effusion, not elsewhere classified: Secondary | ICD-10-CM | POA: Diagnosis not present

## 2020-08-06 DIAGNOSIS — N179 Acute kidney failure, unspecified: Secondary | ICD-10-CM | POA: Diagnosis not present

## 2020-08-07 DIAGNOSIS — R6521 Severe sepsis with septic shock: Secondary | ICD-10-CM | POA: Diagnosis not present

## 2020-08-07 DIAGNOSIS — J9811 Atelectasis: Secondary | ICD-10-CM | POA: Diagnosis not present

## 2020-08-07 DIAGNOSIS — J90 Pleural effusion, not elsewhere classified: Secondary | ICD-10-CM | POA: Diagnosis not present

## 2020-08-07 DIAGNOSIS — I480 Paroxysmal atrial fibrillation: Secondary | ICD-10-CM | POA: Diagnosis not present

## 2020-08-07 DIAGNOSIS — N179 Acute kidney failure, unspecified: Secondary | ICD-10-CM | POA: Diagnosis not present

## 2020-08-07 DIAGNOSIS — J9600 Acute respiratory failure, unspecified whether with hypoxia or hypercapnia: Secondary | ICD-10-CM | POA: Diagnosis not present

## 2020-08-07 DIAGNOSIS — I959 Hypotension, unspecified: Secondary | ICD-10-CM | POA: Diagnosis not present

## 2020-08-07 DIAGNOSIS — N183 Chronic kidney disease, stage 3 unspecified: Secondary | ICD-10-CM | POA: Diagnosis not present

## 2020-08-07 DIAGNOSIS — K56609 Unspecified intestinal obstruction, unspecified as to partial versus complete obstruction: Secondary | ICD-10-CM | POA: Diagnosis not present

## 2020-08-08 DIAGNOSIS — S299XXA Unspecified injury of thorax, initial encounter: Secondary | ICD-10-CM | POA: Diagnosis not present

## 2020-08-08 DIAGNOSIS — N179 Acute kidney failure, unspecified: Secondary | ICD-10-CM | POA: Diagnosis not present

## 2020-08-08 DIAGNOSIS — J9600 Acute respiratory failure, unspecified whether with hypoxia or hypercapnia: Secondary | ICD-10-CM | POA: Diagnosis not present

## 2020-08-08 DIAGNOSIS — N183 Chronic kidney disease, stage 3 unspecified: Secondary | ICD-10-CM | POA: Diagnosis not present

## 2020-08-08 DIAGNOSIS — R6521 Severe sepsis with septic shock: Secondary | ICD-10-CM | POA: Diagnosis not present

## 2020-08-08 DIAGNOSIS — I959 Hypotension, unspecified: Secondary | ICD-10-CM | POA: Diagnosis not present

## 2020-08-09 DIAGNOSIS — R918 Other nonspecific abnormal finding of lung field: Secondary | ICD-10-CM | POA: Diagnosis not present

## 2020-08-09 DIAGNOSIS — N183 Chronic kidney disease, stage 3 unspecified: Secondary | ICD-10-CM | POA: Diagnosis not present

## 2020-08-09 DIAGNOSIS — R6521 Severe sepsis with septic shock: Secondary | ICD-10-CM | POA: Diagnosis not present

## 2020-08-09 DIAGNOSIS — S299XXA Unspecified injury of thorax, initial encounter: Secondary | ICD-10-CM | POA: Diagnosis not present

## 2020-08-09 DIAGNOSIS — I959 Hypotension, unspecified: Secondary | ICD-10-CM | POA: Diagnosis not present

## 2020-08-09 DIAGNOSIS — N179 Acute kidney failure, unspecified: Secondary | ICD-10-CM | POA: Diagnosis not present

## 2020-08-09 DIAGNOSIS — J9600 Acute respiratory failure, unspecified whether with hypoxia or hypercapnia: Secondary | ICD-10-CM | POA: Diagnosis not present

## 2020-08-10 DIAGNOSIS — J9811 Atelectasis: Secondary | ICD-10-CM | POA: Diagnosis not present

## 2020-08-10 DIAGNOSIS — I480 Paroxysmal atrial fibrillation: Secondary | ICD-10-CM | POA: Diagnosis not present

## 2020-08-10 DIAGNOSIS — N183 Chronic kidney disease, stage 3 unspecified: Secondary | ICD-10-CM | POA: Diagnosis not present

## 2020-08-10 DIAGNOSIS — J9600 Acute respiratory failure, unspecified whether with hypoxia or hypercapnia: Secondary | ICD-10-CM | POA: Diagnosis not present

## 2020-08-10 DIAGNOSIS — N179 Acute kidney failure, unspecified: Secondary | ICD-10-CM | POA: Diagnosis not present

## 2020-08-10 DIAGNOSIS — I959 Hypotension, unspecified: Secondary | ICD-10-CM | POA: Diagnosis not present

## 2020-08-10 DIAGNOSIS — R6521 Severe sepsis with septic shock: Secondary | ICD-10-CM | POA: Diagnosis not present

## 2020-08-10 DIAGNOSIS — K56609 Unspecified intestinal obstruction, unspecified as to partial versus complete obstruction: Secondary | ICD-10-CM | POA: Diagnosis not present

## 2020-08-11 DIAGNOSIS — J90 Pleural effusion, not elsewhere classified: Secondary | ICD-10-CM | POA: Diagnosis not present

## 2020-08-11 DIAGNOSIS — N179 Acute kidney failure, unspecified: Secondary | ICD-10-CM | POA: Diagnosis not present

## 2020-08-11 DIAGNOSIS — R6521 Severe sepsis with septic shock: Secondary | ICD-10-CM | POA: Diagnosis not present

## 2020-08-11 DIAGNOSIS — J9600 Acute respiratory failure, unspecified whether with hypoxia or hypercapnia: Secondary | ICD-10-CM | POA: Diagnosis not present

## 2020-08-11 DIAGNOSIS — K5730 Diverticulosis of large intestine without perforation or abscess without bleeding: Secondary | ICD-10-CM | POA: Diagnosis not present

## 2020-08-11 DIAGNOSIS — K8020 Calculus of gallbladder without cholecystitis without obstruction: Secondary | ICD-10-CM | POA: Diagnosis not present

## 2020-08-11 DIAGNOSIS — S3993XA Unspecified injury of pelvis, initial encounter: Secondary | ICD-10-CM | POA: Diagnosis not present

## 2020-08-11 DIAGNOSIS — N183 Chronic kidney disease, stage 3 unspecified: Secondary | ICD-10-CM | POA: Diagnosis not present

## 2020-08-11 DIAGNOSIS — I959 Hypotension, unspecified: Secondary | ICD-10-CM | POA: Diagnosis not present

## 2020-08-11 DIAGNOSIS — J9811 Atelectasis: Secondary | ICD-10-CM | POA: Diagnosis not present

## 2020-08-12 DIAGNOSIS — K56609 Unspecified intestinal obstruction, unspecified as to partial versus complete obstruction: Secondary | ICD-10-CM | POA: Diagnosis not present

## 2020-08-12 DIAGNOSIS — I480 Paroxysmal atrial fibrillation: Secondary | ICD-10-CM | POA: Diagnosis not present

## 2020-08-12 DIAGNOSIS — I959 Hypotension, unspecified: Secondary | ICD-10-CM | POA: Diagnosis not present

## 2020-08-12 DIAGNOSIS — J9600 Acute respiratory failure, unspecified whether with hypoxia or hypercapnia: Secondary | ICD-10-CM | POA: Diagnosis not present

## 2020-08-12 DIAGNOSIS — N179 Acute kidney failure, unspecified: Secondary | ICD-10-CM | POA: Diagnosis not present

## 2020-08-12 DIAGNOSIS — R6521 Severe sepsis with septic shock: Secondary | ICD-10-CM | POA: Diagnosis not present

## 2020-08-12 DIAGNOSIS — N183 Chronic kidney disease, stage 3 unspecified: Secondary | ICD-10-CM | POA: Diagnosis not present

## 2020-08-13 DIAGNOSIS — I959 Hypotension, unspecified: Secondary | ICD-10-CM | POA: Diagnosis not present

## 2020-08-13 DIAGNOSIS — N179 Acute kidney failure, unspecified: Secondary | ICD-10-CM | POA: Diagnosis not present

## 2020-08-13 DIAGNOSIS — N183 Chronic kidney disease, stage 3 unspecified: Secondary | ICD-10-CM | POA: Diagnosis not present

## 2020-08-13 DIAGNOSIS — J9600 Acute respiratory failure, unspecified whether with hypoxia or hypercapnia: Secondary | ICD-10-CM | POA: Diagnosis not present

## 2020-08-13 DIAGNOSIS — R6521 Severe sepsis with septic shock: Secondary | ICD-10-CM | POA: Diagnosis not present

## 2020-08-16 DIAGNOSIS — N179 Acute kidney failure, unspecified: Secondary | ICD-10-CM | POA: Diagnosis not present

## 2020-08-16 DIAGNOSIS — I480 Paroxysmal atrial fibrillation: Secondary | ICD-10-CM | POA: Diagnosis not present

## 2020-08-16 DIAGNOSIS — J9600 Acute respiratory failure, unspecified whether with hypoxia or hypercapnia: Secondary | ICD-10-CM | POA: Diagnosis not present

## 2020-08-16 DIAGNOSIS — I959 Hypotension, unspecified: Secondary | ICD-10-CM | POA: Diagnosis not present

## 2020-08-16 DIAGNOSIS — R6521 Severe sepsis with septic shock: Secondary | ICD-10-CM | POA: Diagnosis not present

## 2020-08-16 DIAGNOSIS — K56609 Unspecified intestinal obstruction, unspecified as to partial versus complete obstruction: Secondary | ICD-10-CM | POA: Diagnosis not present

## 2020-08-16 DIAGNOSIS — N183 Chronic kidney disease, stage 3 unspecified: Secondary | ICD-10-CM | POA: Diagnosis not present

## 2020-08-17 DIAGNOSIS — I420 Dilated cardiomyopathy: Secondary | ICD-10-CM | POA: Insufficient documentation

## 2020-08-17 DIAGNOSIS — N2581 Secondary hyperparathyroidism of renal origin: Secondary | ICD-10-CM | POA: Insufficient documentation

## 2020-08-17 DIAGNOSIS — Z48815 Encounter for surgical aftercare following surgery on the digestive system: Secondary | ICD-10-CM | POA: Diagnosis not present

## 2020-08-17 DIAGNOSIS — D631 Anemia in chronic kidney disease: Secondary | ICD-10-CM | POA: Insufficient documentation

## 2020-08-17 DIAGNOSIS — E1129 Type 2 diabetes mellitus with other diabetic kidney complication: Secondary | ICD-10-CM | POA: Diagnosis not present

## 2020-08-17 DIAGNOSIS — E871 Hypo-osmolality and hyponatremia: Secondary | ICD-10-CM | POA: Diagnosis not present

## 2020-08-17 DIAGNOSIS — N179 Acute kidney failure, unspecified: Secondary | ICD-10-CM | POA: Insufficient documentation

## 2020-08-17 DIAGNOSIS — D509 Iron deficiency anemia, unspecified: Secondary | ICD-10-CM | POA: Insufficient documentation

## 2020-08-17 DIAGNOSIS — R6521 Severe sepsis with septic shock: Secondary | ICD-10-CM | POA: Diagnosis not present

## 2020-08-17 DIAGNOSIS — D649 Anemia, unspecified: Secondary | ICD-10-CM | POA: Insufficient documentation

## 2020-08-17 DIAGNOSIS — I2510 Atherosclerotic heart disease of native coronary artery without angina pectoris: Secondary | ICD-10-CM | POA: Diagnosis not present

## 2020-08-17 DIAGNOSIS — Z992 Dependence on renal dialysis: Secondary | ICD-10-CM | POA: Diagnosis not present

## 2020-08-17 DIAGNOSIS — T7840XA Allergy, unspecified, initial encounter: Secondary | ICD-10-CM | POA: Insufficient documentation

## 2020-08-17 DIAGNOSIS — G4733 Obstructive sleep apnea (adult) (pediatric): Secondary | ICD-10-CM | POA: Diagnosis not present

## 2020-08-17 DIAGNOSIS — I5030 Unspecified diastolic (congestive) heart failure: Secondary | ICD-10-CM | POA: Diagnosis not present

## 2020-08-17 DIAGNOSIS — B952 Enterococcus as the cause of diseases classified elsewhere: Secondary | ICD-10-CM | POA: Diagnosis not present

## 2020-08-17 DIAGNOSIS — I959 Hypotension, unspecified: Secondary | ICD-10-CM | POA: Diagnosis not present

## 2020-08-17 DIAGNOSIS — T8142XD Infection following a procedure, deep incisional surgical site, subsequent encounter: Secondary | ICD-10-CM | POA: Diagnosis not present

## 2020-08-17 DIAGNOSIS — I209 Angina pectoris, unspecified: Secondary | ICD-10-CM | POA: Insufficient documentation

## 2020-08-17 DIAGNOSIS — K5900 Constipation, unspecified: Secondary | ICD-10-CM | POA: Diagnosis not present

## 2020-08-18 DIAGNOSIS — R6521 Severe sepsis with septic shock: Secondary | ICD-10-CM | POA: Diagnosis not present

## 2020-08-18 DIAGNOSIS — B952 Enterococcus as the cause of diseases classified elsewhere: Secondary | ICD-10-CM | POA: Diagnosis not present

## 2020-08-19 DIAGNOSIS — Z992 Dependence on renal dialysis: Secondary | ICD-10-CM | POA: Diagnosis not present

## 2020-08-19 DIAGNOSIS — N179 Acute kidney failure, unspecified: Secondary | ICD-10-CM | POA: Diagnosis not present

## 2020-08-19 DIAGNOSIS — N2581 Secondary hyperparathyroidism of renal origin: Secondary | ICD-10-CM | POA: Diagnosis not present

## 2020-08-19 DIAGNOSIS — E1129 Type 2 diabetes mellitus with other diabetic kidney complication: Secondary | ICD-10-CM | POA: Diagnosis not present

## 2020-08-20 DIAGNOSIS — T8142XD Infection following a procedure, deep incisional surgical site, subsequent encounter: Secondary | ICD-10-CM | POA: Diagnosis not present

## 2020-08-20 DIAGNOSIS — R6521 Severe sepsis with septic shock: Secondary | ICD-10-CM | POA: Diagnosis not present

## 2020-08-20 DIAGNOSIS — B952 Enterococcus as the cause of diseases classified elsewhere: Secondary | ICD-10-CM | POA: Diagnosis not present

## 2020-08-21 DIAGNOSIS — E1129 Type 2 diabetes mellitus with other diabetic kidney complication: Secondary | ICD-10-CM | POA: Diagnosis not present

## 2020-08-21 DIAGNOSIS — N179 Acute kidney failure, unspecified: Secondary | ICD-10-CM | POA: Diagnosis not present

## 2020-08-21 DIAGNOSIS — N2581 Secondary hyperparathyroidism of renal origin: Secondary | ICD-10-CM | POA: Diagnosis not present

## 2020-08-21 DIAGNOSIS — Z992 Dependence on renal dialysis: Secondary | ICD-10-CM | POA: Diagnosis not present

## 2020-08-23 DIAGNOSIS — K5900 Constipation, unspecified: Secondary | ICD-10-CM | POA: Diagnosis not present

## 2020-08-23 DIAGNOSIS — B952 Enterococcus as the cause of diseases classified elsewhere: Secondary | ICD-10-CM | POA: Diagnosis not present

## 2020-08-23 DIAGNOSIS — T8142XD Infection following a procedure, deep incisional surgical site, subsequent encounter: Secondary | ICD-10-CM | POA: Diagnosis not present

## 2020-08-23 DIAGNOSIS — R6521 Severe sepsis with septic shock: Secondary | ICD-10-CM | POA: Diagnosis not present

## 2020-08-23 DIAGNOSIS — G4733 Obstructive sleep apnea (adult) (pediatric): Secondary | ICD-10-CM | POA: Diagnosis not present

## 2020-08-24 DIAGNOSIS — T8142XD Infection following a procedure, deep incisional surgical site, subsequent encounter: Secondary | ICD-10-CM | POA: Diagnosis not present

## 2020-08-24 DIAGNOSIS — R6521 Severe sepsis with septic shock: Secondary | ICD-10-CM | POA: Diagnosis not present

## 2020-08-24 DIAGNOSIS — B952 Enterococcus as the cause of diseases classified elsewhere: Secondary | ICD-10-CM | POA: Diagnosis not present

## 2020-08-24 DIAGNOSIS — G4733 Obstructive sleep apnea (adult) (pediatric): Secondary | ICD-10-CM | POA: Diagnosis not present

## 2020-08-24 DIAGNOSIS — N179 Acute kidney failure, unspecified: Secondary | ICD-10-CM | POA: Diagnosis not present

## 2020-08-24 DIAGNOSIS — Z992 Dependence on renal dialysis: Secondary | ICD-10-CM | POA: Diagnosis not present

## 2020-08-24 DIAGNOSIS — N2581 Secondary hyperparathyroidism of renal origin: Secondary | ICD-10-CM | POA: Diagnosis not present

## 2020-08-25 DIAGNOSIS — R6521 Severe sepsis with septic shock: Secondary | ICD-10-CM | POA: Diagnosis not present

## 2020-08-25 DIAGNOSIS — B952 Enterococcus as the cause of diseases classified elsewhere: Secondary | ICD-10-CM | POA: Diagnosis not present

## 2020-08-25 DIAGNOSIS — Z950 Presence of cardiac pacemaker: Secondary | ICD-10-CM | POA: Insufficient documentation

## 2020-08-26 DIAGNOSIS — T8142XD Infection following a procedure, deep incisional surgical site, subsequent encounter: Secondary | ICD-10-CM | POA: Diagnosis not present

## 2020-08-26 DIAGNOSIS — Z992 Dependence on renal dialysis: Secondary | ICD-10-CM | POA: Diagnosis not present

## 2020-08-26 DIAGNOSIS — N2581 Secondary hyperparathyroidism of renal origin: Secondary | ICD-10-CM | POA: Diagnosis not present

## 2020-08-26 DIAGNOSIS — N179 Acute kidney failure, unspecified: Secondary | ICD-10-CM | POA: Diagnosis not present

## 2020-08-26 DIAGNOSIS — B952 Enterococcus as the cause of diseases classified elsewhere: Secondary | ICD-10-CM | POA: Diagnosis not present

## 2020-08-26 DIAGNOSIS — K5900 Constipation, unspecified: Secondary | ICD-10-CM | POA: Diagnosis not present

## 2020-08-28 DIAGNOSIS — K8018 Calculus of gallbladder with other cholecystitis without obstruction: Secondary | ICD-10-CM | POA: Insufficient documentation

## 2020-08-28 DIAGNOSIS — N179 Acute kidney failure, unspecified: Secondary | ICD-10-CM | POA: Diagnosis not present

## 2020-08-28 DIAGNOSIS — E1129 Type 2 diabetes mellitus with other diabetic kidney complication: Secondary | ICD-10-CM | POA: Diagnosis not present

## 2020-08-28 DIAGNOSIS — G4733 Obstructive sleep apnea (adult) (pediatric): Secondary | ICD-10-CM | POA: Insufficient documentation

## 2020-08-28 DIAGNOSIS — Z992 Dependence on renal dialysis: Secondary | ICD-10-CM | POA: Diagnosis not present

## 2020-08-28 DIAGNOSIS — N2581 Secondary hyperparathyroidism of renal origin: Secondary | ICD-10-CM | POA: Diagnosis not present

## 2020-08-28 DIAGNOSIS — J9601 Acute respiratory failure with hypoxia: Secondary | ICD-10-CM | POA: Insufficient documentation

## 2020-08-28 DIAGNOSIS — J9600 Acute respiratory failure, unspecified whether with hypoxia or hypercapnia: Secondary | ICD-10-CM | POA: Insufficient documentation

## 2020-08-31 DIAGNOSIS — D509 Iron deficiency anemia, unspecified: Secondary | ICD-10-CM | POA: Diagnosis not present

## 2020-08-31 DIAGNOSIS — N2581 Secondary hyperparathyroidism of renal origin: Secondary | ICD-10-CM | POA: Diagnosis not present

## 2020-08-31 DIAGNOSIS — Z992 Dependence on renal dialysis: Secondary | ICD-10-CM | POA: Diagnosis not present

## 2020-08-31 DIAGNOSIS — N179 Acute kidney failure, unspecified: Secondary | ICD-10-CM | POA: Diagnosis not present

## 2020-08-31 DIAGNOSIS — E1129 Type 2 diabetes mellitus with other diabetic kidney complication: Secondary | ICD-10-CM | POA: Diagnosis not present

## 2020-09-01 ENCOUNTER — Ambulatory Visit (INDEPENDENT_AMBULATORY_CARE_PROVIDER_SITE_OTHER): Payer: Medicare Other | Admitting: Legal Medicine

## 2020-09-01 ENCOUNTER — Encounter: Payer: Self-pay | Admitting: Legal Medicine

## 2020-09-01 ENCOUNTER — Other Ambulatory Visit: Payer: Self-pay

## 2020-09-01 VITALS — BP 120/76 | HR 82 | Temp 97.6°F | Resp 17 | Ht 73.0 in | Wt 287.0 lb

## 2020-09-01 DIAGNOSIS — J9611 Chronic respiratory failure with hypoxia: Secondary | ICD-10-CM

## 2020-09-01 DIAGNOSIS — N185 Chronic kidney disease, stage 5: Secondary | ICD-10-CM | POA: Diagnosis not present

## 2020-09-01 DIAGNOSIS — I5032 Chronic diastolic (congestive) heart failure: Secondary | ICD-10-CM | POA: Diagnosis not present

## 2020-09-01 DIAGNOSIS — E1151 Type 2 diabetes mellitus with diabetic peripheral angiopathy without gangrene: Secondary | ICD-10-CM | POA: Diagnosis not present

## 2020-09-01 DIAGNOSIS — N186 End stage renal disease: Secondary | ICD-10-CM

## 2020-09-01 DIAGNOSIS — I482 Chronic atrial fibrillation, unspecified: Secondary | ICD-10-CM | POA: Diagnosis not present

## 2020-09-01 DIAGNOSIS — D6949 Other primary thrombocytopenia: Secondary | ICD-10-CM

## 2020-09-01 DIAGNOSIS — Z992 Dependence on renal dialysis: Secondary | ICD-10-CM

## 2020-09-01 DIAGNOSIS — F5101 Primary insomnia: Secondary | ICD-10-CM

## 2020-09-01 DIAGNOSIS — I10 Essential (primary) hypertension: Secondary | ICD-10-CM | POA: Diagnosis not present

## 2020-09-01 DIAGNOSIS — K56609 Unspecified intestinal obstruction, unspecified as to partial versus complete obstruction: Secondary | ICD-10-CM

## 2020-09-01 DIAGNOSIS — N184 Chronic kidney disease, stage 4 (severe): Secondary | ICD-10-CM | POA: Insufficient documentation

## 2020-09-01 MED ORDER — TRAZODONE HCL 50 MG PO TABS: 25.0000 mg | ORAL_TABLET | Freq: Every evening | ORAL | 3 refills | Status: DC | PRN

## 2020-09-01 NOTE — Progress Notes (Signed)
Subjective:  Patient ID: Steven Hester, male    DOB: 1945/09/11  Age: 75 y.o. MRN: [id number]  Chief Complaint  Patient presents with  . small bowel obstruction    HPIP patient present for transition of care and reconciliation of medicines.  He was admitted 07/21/2020 to [facility] hospital.  He had SBO with repair and resulting sepsis, he had DVT and treated in hospital.  He had acute renal failure requiring dialysis which he is continuing in [place]. He was transferred to rehab on 08/16/2020 and was released on 08/28/2020.  He is now at home and with daughter.  He will need help at home with demented wife.  He has Picc line and will need stent placed.  This is a complicated patient we discussed home health services and care of his abdominal wound.  We also called Meals on Wheels for them so they can get some food the family lives close by and will help but presently the patient is basically wheelchair-bound and his wife has severe dementia he will need extensive support. Current Outpatient Medications on File Prior to Visit  Medication Sig Dispense Refill  . oxyCODONE (OXY IR/ROXICODONE) 5 MG immediate release tablet Take by mouth.    . Tuberculin PPD (TUBERSOL ID) Inject into the skin.    Marland Kitchen glucose blood test strip 1 each by Other route 2 (two) times daily. Use as instructed 100 each 3  . midodrine (PROAMATINE) 5 MG tablet Take 5 mg by mouth 2 (two) times daily.    . nitroGLYCERIN (NITROSTAT) 0.4 MG SL tablet Place under the tongue.    . pantoprazole (PROTONIX) 40 MG tablet Take 1 tablet by mouth 2 (two) times daily.     No current facility-administered medications on file prior to visit.   Past Medical History:  Diagnosis Date  . Chronic atrial fibrillation ([place])   . Essential hypertension   . HLD (hyperlipidemia)   . Mixed hyperlipidemia   . Other primary thrombocytopenia ([place]) 08/27/2019  . Type 2 diabetes mellitus with other specified complication [facility])    Past Surgical  History:  Procedure Laterality Date  . CORONARY ANGIOPLASTY WITH STENT PLACEMENT      Family History  Problem Relation Age of Onset  . Heart attack Mother   . GI Disease Father   . Cancer Father    Social History   Socioeconomic History  . Marital status: Married    Spouse name: Not on file  . Number of children: Not on file  . Years of education: Not on file  . Highest education level: Not on file  Occupational History  . Not on file  Tobacco Use  . Smoking status: Former Smoker    Packs/day: 1.00    Years: 10.00    Pack years: 10.00    Types: Cigarettes  . Smokeless tobacco: Never Used  Substance and Sexual Activity  . Alcohol use: Yes    Comment: occaionally   . Drug use: Never  . Sexual activity: Not on file  Other Topics Concern  . Not on file  Social History Narrative  . Not on file   Social Determinants of Health   Financial Resource Strain: Low Risk   . Difficulty of Paying Living Expenses: Not hard at all  Food Insecurity: No Food Insecurity  . Worried About Charity fundraiser in the Last Year: Never true  . Ran Out of Food in the Last Year: Never true  Transportation Needs: No Transportation Needs  .  Lack of Transportation (Medical): No  . Lack of Transportation (Non-Medical): No  Physical Activity: Insufficiently Active  . Days of Exercise per Week: 2 days  . Minutes of Exercise per Session: 30 min  Stress: No Stress Concern Present  . Feeling of Stress : Not at all  Social Connections: Moderately Isolated  . Frequency of Communication with Friends and Family: Three times a week  . Frequency of Social Gatherings with Friends and Family: Never  . Attends Religious Services: Never  . Active Member of Clubs or Organizations: No  . Attends Archivist Meetings: Never  . Marital Status: Married    Review of Systems  Constitutional: Negative.  Negative for activity change and appetite change.       Wheel chair, walker at home  HENT:  Negative for congestion and rhinorrhea.   Eyes: Negative for visual disturbance.  Respiratory: Negative for chest tightness and shortness of breath.   Cardiovascular: Negative.  Negative for chest pain, palpitations and leg swelling.  Gastrointestinal: Negative for abdominal distention and abdominal pain.  Endocrine: Negative for polyuria.  Genitourinary: Negative for difficulty urinating, dysuria and urgency.  Musculoskeletal: Negative for arthralgias and back pain.       Large wound abdomen 8 cm  Skin: Negative.   Neurological: Negative.   Psychiatric/Behavioral: Negative.      Objective:  BP 120/76   Pulse 82   Temp 97.6 F (36.4 C)   Resp 17   Ht 6\' 1"  (1.854 m)   Wt 287 lb (130.2 kg)   SpO2 98%   BMI 37.87 kg/m   BP/Weight 09/01/2020 05/06/2020 37/02/6268  Systolic BP 485 462 703  Diastolic BP 76 68 60  Wt. (Lbs) 287 302 300.2  BMI 37.87 39.84 39.61    Physical Exam Vitals reviewed.  Constitutional:      Appearance: Normal appearance. He is obese.  HENT:     Head: Normocephalic and atraumatic.     Right Ear: Tympanic membrane, ear canal and external ear normal.     Left Ear: Tympanic membrane, ear canal and external ear normal.     Mouth/Throat:     Mouth: Mucous membranes are moist.     Pharynx: Oropharynx is clear.  Eyes:     Extraocular Movements: Extraocular movements intact.     Conjunctiva/sclera: Conjunctivae normal.     Pupils: Pupils are equal, round, and reactive to light.  Cardiovascular:     Rate and Rhythm: Normal rate and regular rhythm.     Pulses: Normal pulses.     Heart sounds: Normal heart sounds. No murmur heard. No gallop.   Pulmonary:     Effort: Pulmonary effort is normal. No respiratory distress.     Breath sounds: Normal breath sounds. No rales.  Abdominal:     General: Abdomen is flat. Bowel sounds are normal. There is no distension.     Palpations: Abdomen is soft.     Tenderness: There is no abdominal tenderness.   Musculoskeletal:        General: Normal range of motion.     Cervical back: Normal range of motion and neck supple.  Skin:    Capillary Refill: Capillary refill takes less than 2 seconds.     Comments: 8 cm wound closing in mid abdomen  Neurological:     General: No focal deficit present.     Mental Status: He is alert and oriented to person, place, and time. Mental status is at baseline.  Motor: Weakness present.       Lab Results  Component Value Date   WBC 6.9 05/06/2020   HGB 13.8 05/06/2020   HCT 41.5 05/06/2020   PLT 103 (L) 05/06/2020   GLUCOSE 93 05/06/2020   CHOL 143 05/06/2020   TRIG 99 05/06/2020   HDL 43 05/06/2020   LDLCALC 82 05/06/2020   ALT 17 05/06/2020   AST 22 05/06/2020   NA 144 05/06/2020   K 4.2 05/06/2020   CL 108 (H) 05/06/2020   CREATININE 1.59 (H) 05/06/2020   BUN 18 05/06/2020   CO2 22 05/06/2020   HGBA1C 5.5 05/06/2020      Assessment & Plan:   Diagnoses and all orders for this visit: Essential hypertension An individual hypertension care plan was established and reinforced today.  The patient's status was assessed using clinical findings on exam and labs or diagnostic tests. The patient's success at meeting treatment goals on disease specific evidence-based guidelines and found to be fair controlled. SELF MANAGEMENT: The patient and I together assessed ways to personally work towards obtaining the recommended goals. RECOMMENDATIONS: avoid decongestants found in common cold remedies, decrease consumption of alcohol, perform routine monitoring of BP with home BP cuff, exercise, reduction of dietary salt, take medicines as prescribed, try not to miss doses and quit smoking.  Regular exercise and maintaining a healthy weight is needed.  Stress reduction may help. A CLINICAL SUMMARY including written plan identify barriers to care unique to individual due to social or financial issues.  We attempt to mutually creat solutions for individual  and family understanding.  Chronic diastolic heart failure (HCC) An individualized care plan was established and reinforced.  The patient's disease status was assessed using clinical finding son exam today, labs, and/or other diagnostic testing such as x-rays, to determine the patient's success in meeting treatmentgoalsbased on disease-based guidelines and found to beimproving. But not at goal yet. Medications prescriptions no changes Laboratory tests ordered to be performed today include none. RECOMMENDATIONS: given include see cardiology the.  Call physician is patient gains 3 lbs in one day or 5 lbs for one week.  Call for progressive PND, orthopnea or increased pedal edema.or meeting  Diabetes mellitus type 2 with peripheral artery disease [facility]) An individual care plan for diabetes was established and reinforced today.  The patient's status was assessed using clinical findings on exam, labs and diagnostic testing. Patient success at meeting goals based on disease specific evidence-based guidelines and found to be fair controlled. Medications were assessed and patient's understanding of the medical issues , including barriers were assessed. Recommend adherence to a diabetic diet, a graduated exercise program, HgbA1c level is checked quarterly, and urine microalbumin performed yearly .  Annual mono-filament sensation testing performed. Lower blood pressure and control hyperlipidemia is important. Get annual eye exams and annual flu shots and smoking cessation discussed.  Self management goals were discussed.  Chronic hypoxemic respiratory failure ([place]) -     Ambulatory referral to [place] Patient had hypoxic respiratory failure in the hospital and does suffer from chronic respiratory failure having to use oxygen at night and as needed during the day.  Chronic atrial fibrillation (HCC) -     Ambulatory referral to [place] Patient has chronic atrial fibrillation and is stable he is  presently not on any anticoagulants.  Morbid obesity ([place]) -     Ambulatory referral to Home Health An individualize plan was formulated for obesity using patient history and physical exam to encourage weight  loss.  An evidence based program was formulated.  Patient is to cut portion size with meals and to plan physical exercise 3 days a week at least 20 minutes.  Weight watchers and other programs are helpful.  Planned amount of weight loss 10 lbs.  Other primary thrombocytopenia ([place]) Patient has chronic primary thrombocytopenia but never in the dangerous range.  CKD (chronic kidney disease) stage 5, GFR less than 15 ml/min (HCC) Patient suffered for him acute kidney failure in the hospital and he is kidneys went from stage IIIa to his stage V and he was started on dialysis.  He is doing dialysis here and in [place] at present.  Chronic kidney disease requiring chronic dialysis [facility]) -     Ambulatory referral to [place] Patient now has chronic kidney disease and is on dialysis most of his medicines have been stopped and the medicine list has been edited to reflect that.  SBO (small bowel obstruction) ([place]) -     Ambulatory referral to Home Health Patient was originally admitted for small bowel obstruction with lysis of adhesions the incidentally removed his gallbladder since he had gallstones and took out his appendix.  He has had no complications from this but the abdominal skin incision is not totally healed.  We will get home health to help [place] with this.  We redressed it at his present visit  Primary insomnia -     traZODone (DESYREL) 50 MG tablet; Take 0.5-1 tablets (25-50 mg total) by mouth at bedtime as needed for sleep. Follow stressed patient started having increased insomnia we will try trazodone for this at the present time    Meds ordered this encounter  Medications  . traZODone (DESYREL) 50 MG tablet    Sig: Take 0.5-1 tablets (25-50 mg total) by mouth at bedtime as  needed for sleep.    Dispense:  30 tablet    Refill:  3    Orders Placed This Encounter  Procedures  . Ambulatory referral to [place]   60 minute visit  Follow-up: Return in about 1 month (around 10/01/2020).  An After Visit Summary was printed and given to the patient.  Reinaldo Meeker, MD [facility] [phone]

## 2020-09-01 NOTE — Patient Instructions (Signed)
Wound Care, Adult Taking care of your wound properly can help to prevent pain, infection, and scarring. It can also help your wound heal more quickly. Follow instructions from your health care provider about how to care for your wound. Supplies needed:  Soap and water.  Wound cleanser.  Gauze.  If needed, a clean bandage (dressing) or other type of wound dressing material to cover or place in the wound. Follow your health care provider's instructions about what dressing supplies to use.  Cream or ointment to apply to the wound, if told by your health care provider. How to care for your wound Cleaning the wound Ask your health care provider how to clean the wound. This may include:  Using mild soap and water or a wound cleanser.  Using a clean gauze to pat the wound dry after cleaning it. Do not rub or scrub the wound. Dressing care  Wash your hands with soap and water for at least 20 seconds before and after you change the dressing. If soap and water are not available, use hand sanitizer.  Change your dressing as told by your health care provider. This may include: ? Cleaning or rinsing out (irrigating) the wound. ? Placing a dressing over the wound or in the wound (packing). ? Covering the wound with an outer dressing.  Leave any stitches (sutures), skin glue, or adhesive strips in place. These skin closures may need to stay in place for 2 weeks or longer. If adhesive strip edges start to loosen and curl up, you may trim the loose edges. Do not remove adhesive strips completely unless your health care provider tells you to do that.  Ask your health care provider when you can leave the wound uncovered. Checking for infection Check your wound area every day for signs of infection. Check for:  More redness, swelling, or pain.  Fluid or blood.  Warmth.  Pus or a bad smell.   Follow these instructions at home Medicines  If you were prescribed an antibiotic medicine, cream, or  ointment, take or apply it as told by your health care provider. Do not stop using the antibiotic even if your condition improves.  If you were prescribed pain medicine, take it 30 minutes before you do any wound care or as told by your health care provider.  Take over-the-counter and prescription medicines only as told by your health care provider. Eating and drinking  Eat a diet that includes protein, vitamin A, vitamin C, and other nutrient-rich foods to help the wound heal. ? Foods rich in protein include meat, fish, eggs, dairy, beans, and nuts. ? Foods rich in vitamin A include carrots and dark green, leafy vegetables. ? Foods rich in vitamin C include citrus fruits, tomatoes, broccoli, and peppers.  Drink enough fluid to keep your urine pale yellow. General instructions  Do not take baths, swim, use a hot tub, or do anything that would put the wound underwater until your health care provider approves. Ask your health care provider if you may take showers. You may only be allowed to take sponge baths.  Do not scratch or pick at the wound. Keep it covered as told by your health care provider.  Return to your normal activities as told by your health care provider. Ask your health care provider what activities are safe for you.  Protect your wound from the sun when you are outside for the first 6 months, or for as long as told by your health care provider. Cover   up the scar area or apply sunscreen that has an SPF of at least 30.  Do not use any products that contain nicotine or tobacco, such as cigarettes, e-cigarettes, and chewing tobacco. These may delay wound healing. If you need help quitting, ask your health care provider.  Keep all follow-up visits as told by your health care provider. This is important. Contact a health care provider if:  You received a tetanus shot and you have swelling, severe pain, redness, or bleeding at the injection site.  Your pain is not controlled  with medicine.  You have any of these signs of infection: ? More redness, swelling, or pain around the wound. ? Fluid or blood coming from the wound. ? Warmth coming from the wound. ? Pus or a bad smell coming from the wound. ? A fever or chills.  You are nauseous or you vomit.  You are dizzy. Get help right away if:  You have a red streak of skin near the area around your wound.  Your wound has been closed with staples, sutures, skin glue, or adhesive strips and it begins to open up and separate.  Your wound is bleeding, and the bleeding does not stop with gentle pressure.  You have a rash.  You faint.  You have trouble breathing. These symptoms may represent a serious problem that is an emergency. Do not wait to see if the symptoms will go away. Get medical help right away. Call your local emergency services (911 in the [place] Do not drive yourself to the hospital. Summary  Always wash your hands with soap and water for at least 20 seconds before and after changing your dressing.  Change your dressing as told by your health care provider.  To help with healing, eat foods that are rich in protein, vitamin A, vitamin C, and other nutrients.  Check your wound every day for signs of infection. Contact your health care provider if you suspect that your wound is infected. This information is not intended to replace advice given to you by your health care provider. Make sure you discuss any questions you have with your health care provider. Document Revised: 02/07/2019 Document Reviewed: 02/07/2019 Elsevier Patient Education  2021 [employer]  

## 2020-09-02 DIAGNOSIS — N2581 Secondary hyperparathyroidism of renal origin: Secondary | ICD-10-CM | POA: Diagnosis not present

## 2020-09-02 DIAGNOSIS — Z992 Dependence on renal dialysis: Secondary | ICD-10-CM | POA: Diagnosis not present

## 2020-09-02 DIAGNOSIS — N179 Acute kidney failure, unspecified: Secondary | ICD-10-CM | POA: Diagnosis not present

## 2020-09-02 DIAGNOSIS — D509 Iron deficiency anemia, unspecified: Secondary | ICD-10-CM | POA: Diagnosis not present

## 2020-09-02 DIAGNOSIS — E1129 Type 2 diabetes mellitus with other diabetic kidney complication: Secondary | ICD-10-CM | POA: Diagnosis not present

## 2020-09-03 DIAGNOSIS — I480 Paroxysmal atrial fibrillation: Secondary | ICD-10-CM | POA: Diagnosis not present

## 2020-09-03 DIAGNOSIS — I129 Hypertensive chronic kidney disease with stage 1 through stage 4 chronic kidney disease, or unspecified chronic kidney disease: Secondary | ICD-10-CM | POA: Diagnosis not present

## 2020-09-03 DIAGNOSIS — Z45018 Encounter for adjustment and management of other part of cardiac pacemaker: Secondary | ICD-10-CM | POA: Diagnosis not present

## 2020-09-03 DIAGNOSIS — I2510 Atherosclerotic heart disease of native coronary artery without angina pectoris: Secondary | ICD-10-CM | POA: Diagnosis not present

## 2020-09-03 DIAGNOSIS — J9611 Chronic respiratory failure with hypoxia: Secondary | ICD-10-CM | POA: Diagnosis not present

## 2020-09-03 DIAGNOSIS — I452 Bifascicular block: Secondary | ICD-10-CM | POA: Diagnosis not present

## 2020-09-03 DIAGNOSIS — N186 End stage renal disease: Secondary | ICD-10-CM | POA: Diagnosis not present

## 2020-09-03 DIAGNOSIS — Z955 Presence of coronary angioplasty implant and graft: Secondary | ICD-10-CM | POA: Diagnosis not present

## 2020-09-03 DIAGNOSIS — I252 Old myocardial infarction: Secondary | ICD-10-CM | POA: Diagnosis not present

## 2020-09-03 DIAGNOSIS — Z992 Dependence on renal dialysis: Secondary | ICD-10-CM | POA: Diagnosis not present

## 2020-09-04 DIAGNOSIS — D509 Iron deficiency anemia, unspecified: Secondary | ICD-10-CM | POA: Diagnosis not present

## 2020-09-04 DIAGNOSIS — J9611 Chronic respiratory failure with hypoxia: Secondary | ICD-10-CM | POA: Diagnosis not present

## 2020-09-04 DIAGNOSIS — N179 Acute kidney failure, unspecified: Secondary | ICD-10-CM | POA: Diagnosis not present

## 2020-09-04 DIAGNOSIS — Z992 Dependence on renal dialysis: Secondary | ICD-10-CM | POA: Diagnosis not present

## 2020-09-04 DIAGNOSIS — E1129 Type 2 diabetes mellitus with other diabetic kidney complication: Secondary | ICD-10-CM | POA: Diagnosis not present

## 2020-09-04 DIAGNOSIS — N2581 Secondary hyperparathyroidism of renal origin: Secondary | ICD-10-CM | POA: Diagnosis not present

## 2020-09-05 DIAGNOSIS — E876 Hypokalemia: Secondary | ICD-10-CM | POA: Diagnosis not present

## 2020-09-05 DIAGNOSIS — Z9981 Dependence on supplemental oxygen: Secondary | ICD-10-CM | POA: Diagnosis not present

## 2020-09-05 DIAGNOSIS — F5101 Primary insomnia: Secondary | ICD-10-CM | POA: Diagnosis not present

## 2020-09-05 DIAGNOSIS — N179 Acute kidney failure, unspecified: Secondary | ICD-10-CM | POA: Diagnosis not present

## 2020-09-05 DIAGNOSIS — Z992 Dependence on renal dialysis: Secondary | ICD-10-CM | POA: Diagnosis not present

## 2020-09-05 DIAGNOSIS — Z87891 Personal history of nicotine dependence: Secondary | ICD-10-CM | POA: Diagnosis not present

## 2020-09-05 DIAGNOSIS — D509 Iron deficiency anemia, unspecified: Secondary | ICD-10-CM | POA: Diagnosis not present

## 2020-09-05 DIAGNOSIS — I482 Chronic atrial fibrillation, unspecified: Secondary | ICD-10-CM | POA: Diagnosis not present

## 2020-09-05 DIAGNOSIS — N186 End stage renal disease: Secondary | ICD-10-CM | POA: Diagnosis not present

## 2020-09-05 DIAGNOSIS — E782 Mixed hyperlipidemia: Secondary | ICD-10-CM | POA: Diagnosis not present

## 2020-09-05 DIAGNOSIS — I429 Cardiomyopathy, unspecified: Secondary | ICD-10-CM | POA: Diagnosis not present

## 2020-09-05 DIAGNOSIS — Z48815 Encounter for surgical aftercare following surgery on the digestive system: Secondary | ICD-10-CM | POA: Diagnosis not present

## 2020-09-05 DIAGNOSIS — E1122 Type 2 diabetes mellitus with diabetic chronic kidney disease: Secondary | ICD-10-CM | POA: Diagnosis not present

## 2020-09-05 DIAGNOSIS — I82409 Acute embolism and thrombosis of unspecified deep veins of unspecified lower extremity: Secondary | ICD-10-CM | POA: Diagnosis not present

## 2020-09-05 DIAGNOSIS — I2510 Atherosclerotic heart disease of native coronary artery without angina pectoris: Secondary | ICD-10-CM | POA: Diagnosis not present

## 2020-09-05 DIAGNOSIS — J9611 Chronic respiratory failure with hypoxia: Secondary | ICD-10-CM | POA: Diagnosis not present

## 2020-09-05 DIAGNOSIS — E1151 Type 2 diabetes mellitus with diabetic peripheral angiopathy without gangrene: Secondary | ICD-10-CM | POA: Diagnosis not present

## 2020-09-05 DIAGNOSIS — I132 Hypertensive heart and chronic kidney disease with heart failure and with stage 5 chronic kidney disease, or end stage renal disease: Secondary | ICD-10-CM | POA: Diagnosis not present

## 2020-09-05 DIAGNOSIS — I452 Bifascicular block: Secondary | ICD-10-CM | POA: Diagnosis not present

## 2020-09-05 DIAGNOSIS — D6949 Other primary thrombocytopenia: Secondary | ICD-10-CM | POA: Diagnosis not present

## 2020-09-05 DIAGNOSIS — I5032 Chronic diastolic (congestive) heart failure: Secondary | ICD-10-CM | POA: Diagnosis not present

## 2020-09-07 DIAGNOSIS — N2581 Secondary hyperparathyroidism of renal origin: Secondary | ICD-10-CM | POA: Diagnosis not present

## 2020-09-07 DIAGNOSIS — D509 Iron deficiency anemia, unspecified: Secondary | ICD-10-CM | POA: Diagnosis not present

## 2020-09-07 DIAGNOSIS — N179 Acute kidney failure, unspecified: Secondary | ICD-10-CM | POA: Diagnosis not present

## 2020-09-07 DIAGNOSIS — E441 Mild protein-calorie malnutrition: Secondary | ICD-10-CM | POA: Insufficient documentation

## 2020-09-07 DIAGNOSIS — E1129 Type 2 diabetes mellitus with other diabetic kidney complication: Secondary | ICD-10-CM | POA: Diagnosis not present

## 2020-09-07 DIAGNOSIS — Z992 Dependence on renal dialysis: Secondary | ICD-10-CM | POA: Diagnosis not present

## 2020-09-08 ENCOUNTER — Telehealth: Payer: Self-pay

## 2020-09-08 DIAGNOSIS — D509 Iron deficiency anemia, unspecified: Secondary | ICD-10-CM | POA: Diagnosis not present

## 2020-09-08 DIAGNOSIS — I2510 Atherosclerotic heart disease of native coronary artery without angina pectoris: Secondary | ICD-10-CM | POA: Diagnosis not present

## 2020-09-08 DIAGNOSIS — D6949 Other primary thrombocytopenia: Secondary | ICD-10-CM | POA: Diagnosis not present

## 2020-09-08 DIAGNOSIS — Z992 Dependence on renal dialysis: Secondary | ICD-10-CM | POA: Diagnosis not present

## 2020-09-08 DIAGNOSIS — F5101 Primary insomnia: Secondary | ICD-10-CM | POA: Diagnosis not present

## 2020-09-08 DIAGNOSIS — Z48815 Encounter for surgical aftercare following surgery on the digestive system: Secondary | ICD-10-CM | POA: Diagnosis not present

## 2020-09-08 DIAGNOSIS — N179 Acute kidney failure, unspecified: Secondary | ICD-10-CM | POA: Diagnosis not present

## 2020-09-08 DIAGNOSIS — I82409 Acute embolism and thrombosis of unspecified deep veins of unspecified lower extremity: Secondary | ICD-10-CM | POA: Diagnosis not present

## 2020-09-08 DIAGNOSIS — I132 Hypertensive heart and chronic kidney disease with heart failure and with stage 5 chronic kidney disease, or end stage renal disease: Secondary | ICD-10-CM | POA: Diagnosis not present

## 2020-09-08 DIAGNOSIS — E782 Mixed hyperlipidemia: Secondary | ICD-10-CM | POA: Diagnosis not present

## 2020-09-08 DIAGNOSIS — E1122 Type 2 diabetes mellitus with diabetic chronic kidney disease: Secondary | ICD-10-CM | POA: Diagnosis not present

## 2020-09-08 DIAGNOSIS — N186 End stage renal disease: Secondary | ICD-10-CM | POA: Diagnosis not present

## 2020-09-08 DIAGNOSIS — Z9981 Dependence on supplemental oxygen: Secondary | ICD-10-CM | POA: Diagnosis not present

## 2020-09-08 DIAGNOSIS — E876 Hypokalemia: Secondary | ICD-10-CM | POA: Diagnosis not present

## 2020-09-08 DIAGNOSIS — I482 Chronic atrial fibrillation, unspecified: Secondary | ICD-10-CM | POA: Diagnosis not present

## 2020-09-08 DIAGNOSIS — I452 Bifascicular block: Secondary | ICD-10-CM | POA: Diagnosis not present

## 2020-09-08 DIAGNOSIS — I5032 Chronic diastolic (congestive) heart failure: Secondary | ICD-10-CM | POA: Diagnosis not present

## 2020-09-08 DIAGNOSIS — E1151 Type 2 diabetes mellitus with diabetic peripheral angiopathy without gangrene: Secondary | ICD-10-CM | POA: Diagnosis not present

## 2020-09-08 DIAGNOSIS — I429 Cardiomyopathy, unspecified: Secondary | ICD-10-CM | POA: Diagnosis not present

## 2020-09-08 DIAGNOSIS — J9611 Chronic respiratory failure with hypoxia: Secondary | ICD-10-CM | POA: Diagnosis not present

## 2020-09-08 DIAGNOSIS — Z87891 Personal history of nicotine dependence: Secondary | ICD-10-CM | POA: Diagnosis not present

## 2020-09-08 NOTE — Telephone Encounter (Signed)
PT w/ [facility] calling requesting verbal orders. Schedule twice a week for three weeks then once a week for five weeks. Gave verbal orders.   Harrell Lark 09/08/20 4:32 PM

## 2020-09-09 DIAGNOSIS — E1129 Type 2 diabetes mellitus with other diabetic kidney complication: Secondary | ICD-10-CM | POA: Diagnosis not present

## 2020-09-09 DIAGNOSIS — Z992 Dependence on renal dialysis: Secondary | ICD-10-CM | POA: Diagnosis not present

## 2020-09-09 DIAGNOSIS — D509 Iron deficiency anemia, unspecified: Secondary | ICD-10-CM | POA: Diagnosis not present

## 2020-09-09 DIAGNOSIS — N2581 Secondary hyperparathyroidism of renal origin: Secondary | ICD-10-CM | POA: Diagnosis not present

## 2020-09-09 DIAGNOSIS — N179 Acute kidney failure, unspecified: Secondary | ICD-10-CM | POA: Diagnosis not present

## 2020-09-11 DIAGNOSIS — E1129 Type 2 diabetes mellitus with other diabetic kidney complication: Secondary | ICD-10-CM | POA: Diagnosis not present

## 2020-09-11 DIAGNOSIS — D509 Iron deficiency anemia, unspecified: Secondary | ICD-10-CM | POA: Diagnosis not present

## 2020-09-11 DIAGNOSIS — N179 Acute kidney failure, unspecified: Secondary | ICD-10-CM | POA: Diagnosis not present

## 2020-09-11 DIAGNOSIS — N2581 Secondary hyperparathyroidism of renal origin: Secondary | ICD-10-CM | POA: Diagnosis not present

## 2020-09-11 DIAGNOSIS — Z992 Dependence on renal dialysis: Secondary | ICD-10-CM | POA: Diagnosis not present

## 2020-09-13 DIAGNOSIS — I82409 Acute embolism and thrombosis of unspecified deep veins of unspecified lower extremity: Secondary | ICD-10-CM | POA: Diagnosis not present

## 2020-09-13 DIAGNOSIS — D6949 Other primary thrombocytopenia: Secondary | ICD-10-CM | POA: Diagnosis not present

## 2020-09-13 DIAGNOSIS — I429 Cardiomyopathy, unspecified: Secondary | ICD-10-CM | POA: Diagnosis not present

## 2020-09-13 DIAGNOSIS — Z992 Dependence on renal dialysis: Secondary | ICD-10-CM | POA: Diagnosis not present

## 2020-09-13 DIAGNOSIS — D509 Iron deficiency anemia, unspecified: Secondary | ICD-10-CM | POA: Diagnosis not present

## 2020-09-13 DIAGNOSIS — N186 End stage renal disease: Secondary | ICD-10-CM | POA: Diagnosis not present

## 2020-09-13 DIAGNOSIS — J9611 Chronic respiratory failure with hypoxia: Secondary | ICD-10-CM | POA: Diagnosis not present

## 2020-09-13 DIAGNOSIS — E1151 Type 2 diabetes mellitus with diabetic peripheral angiopathy without gangrene: Secondary | ICD-10-CM | POA: Diagnosis not present

## 2020-09-13 DIAGNOSIS — Z87891 Personal history of nicotine dependence: Secondary | ICD-10-CM | POA: Diagnosis not present

## 2020-09-13 DIAGNOSIS — I2510 Atherosclerotic heart disease of native coronary artery without angina pectoris: Secondary | ICD-10-CM | POA: Diagnosis not present

## 2020-09-13 DIAGNOSIS — I452 Bifascicular block: Secondary | ICD-10-CM | POA: Diagnosis not present

## 2020-09-13 DIAGNOSIS — E1122 Type 2 diabetes mellitus with diabetic chronic kidney disease: Secondary | ICD-10-CM | POA: Diagnosis not present

## 2020-09-13 DIAGNOSIS — E782 Mixed hyperlipidemia: Secondary | ICD-10-CM | POA: Diagnosis not present

## 2020-09-13 DIAGNOSIS — I132 Hypertensive heart and chronic kidney disease with heart failure and with stage 5 chronic kidney disease, or end stage renal disease: Secondary | ICD-10-CM | POA: Diagnosis not present

## 2020-09-13 DIAGNOSIS — E876 Hypokalemia: Secondary | ICD-10-CM | POA: Diagnosis not present

## 2020-09-13 DIAGNOSIS — Z48815 Encounter for surgical aftercare following surgery on the digestive system: Secondary | ICD-10-CM | POA: Diagnosis not present

## 2020-09-13 DIAGNOSIS — Z9981 Dependence on supplemental oxygen: Secondary | ICD-10-CM | POA: Diagnosis not present

## 2020-09-13 DIAGNOSIS — I482 Chronic atrial fibrillation, unspecified: Secondary | ICD-10-CM | POA: Diagnosis not present

## 2020-09-13 DIAGNOSIS — F5101 Primary insomnia: Secondary | ICD-10-CM | POA: Diagnosis not present

## 2020-09-13 DIAGNOSIS — I5032 Chronic diastolic (congestive) heart failure: Secondary | ICD-10-CM | POA: Diagnosis not present

## 2020-09-13 DIAGNOSIS — N179 Acute kidney failure, unspecified: Secondary | ICD-10-CM | POA: Diagnosis not present

## 2020-09-14 DIAGNOSIS — E1129 Type 2 diabetes mellitus with other diabetic kidney complication: Secondary | ICD-10-CM | POA: Diagnosis not present

## 2020-09-14 DIAGNOSIS — D509 Iron deficiency anemia, unspecified: Secondary | ICD-10-CM | POA: Diagnosis not present

## 2020-09-14 DIAGNOSIS — N179 Acute kidney failure, unspecified: Secondary | ICD-10-CM | POA: Diagnosis not present

## 2020-09-14 DIAGNOSIS — R197 Diarrhea, unspecified: Secondary | ICD-10-CM | POA: Diagnosis not present

## 2020-09-14 DIAGNOSIS — N2581 Secondary hyperparathyroidism of renal origin: Secondary | ICD-10-CM | POA: Diagnosis not present

## 2020-09-14 DIAGNOSIS — Z992 Dependence on renal dialysis: Secondary | ICD-10-CM | POA: Diagnosis not present

## 2020-09-15 DIAGNOSIS — I5032 Chronic diastolic (congestive) heart failure: Secondary | ICD-10-CM | POA: Diagnosis not present

## 2020-09-15 DIAGNOSIS — J9611 Chronic respiratory failure with hypoxia: Secondary | ICD-10-CM | POA: Diagnosis not present

## 2020-09-15 DIAGNOSIS — Z48815 Encounter for surgical aftercare following surgery on the digestive system: Secondary | ICD-10-CM | POA: Diagnosis not present

## 2020-09-15 DIAGNOSIS — Z992 Dependence on renal dialysis: Secondary | ICD-10-CM | POA: Diagnosis not present

## 2020-09-15 DIAGNOSIS — Z9981 Dependence on supplemental oxygen: Secondary | ICD-10-CM | POA: Diagnosis not present

## 2020-09-15 DIAGNOSIS — N186 End stage renal disease: Secondary | ICD-10-CM | POA: Diagnosis not present

## 2020-09-15 DIAGNOSIS — I452 Bifascicular block: Secondary | ICD-10-CM | POA: Diagnosis not present

## 2020-09-15 DIAGNOSIS — D6949 Other primary thrombocytopenia: Secondary | ICD-10-CM | POA: Diagnosis not present

## 2020-09-15 DIAGNOSIS — I429 Cardiomyopathy, unspecified: Secondary | ICD-10-CM | POA: Diagnosis not present

## 2020-09-15 DIAGNOSIS — E1122 Type 2 diabetes mellitus with diabetic chronic kidney disease: Secondary | ICD-10-CM | POA: Diagnosis not present

## 2020-09-15 DIAGNOSIS — E782 Mixed hyperlipidemia: Secondary | ICD-10-CM | POA: Diagnosis not present

## 2020-09-15 DIAGNOSIS — F5101 Primary insomnia: Secondary | ICD-10-CM | POA: Diagnosis not present

## 2020-09-15 DIAGNOSIS — D509 Iron deficiency anemia, unspecified: Secondary | ICD-10-CM | POA: Diagnosis not present

## 2020-09-15 DIAGNOSIS — I82409 Acute embolism and thrombosis of unspecified deep veins of unspecified lower extremity: Secondary | ICD-10-CM | POA: Diagnosis not present

## 2020-09-15 DIAGNOSIS — Z87891 Personal history of nicotine dependence: Secondary | ICD-10-CM | POA: Diagnosis not present

## 2020-09-15 DIAGNOSIS — E1151 Type 2 diabetes mellitus with diabetic peripheral angiopathy without gangrene: Secondary | ICD-10-CM | POA: Diagnosis not present

## 2020-09-15 DIAGNOSIS — I2510 Atherosclerotic heart disease of native coronary artery without angina pectoris: Secondary | ICD-10-CM | POA: Diagnosis not present

## 2020-09-15 DIAGNOSIS — I132 Hypertensive heart and chronic kidney disease with heart failure and with stage 5 chronic kidney disease, or end stage renal disease: Secondary | ICD-10-CM | POA: Diagnosis not present

## 2020-09-15 DIAGNOSIS — I482 Chronic atrial fibrillation, unspecified: Secondary | ICD-10-CM | POA: Diagnosis not present

## 2020-09-15 DIAGNOSIS — N179 Acute kidney failure, unspecified: Secondary | ICD-10-CM | POA: Diagnosis not present

## 2020-09-15 DIAGNOSIS — E876 Hypokalemia: Secondary | ICD-10-CM | POA: Diagnosis not present

## 2020-09-16 DIAGNOSIS — D509 Iron deficiency anemia, unspecified: Secondary | ICD-10-CM | POA: Diagnosis not present

## 2020-09-16 DIAGNOSIS — N179 Acute kidney failure, unspecified: Secondary | ICD-10-CM | POA: Diagnosis not present

## 2020-09-16 DIAGNOSIS — R197 Diarrhea, unspecified: Secondary | ICD-10-CM | POA: Diagnosis not present

## 2020-09-16 DIAGNOSIS — Z992 Dependence on renal dialysis: Secondary | ICD-10-CM | POA: Diagnosis not present

## 2020-09-16 DIAGNOSIS — N2581 Secondary hyperparathyroidism of renal origin: Secondary | ICD-10-CM | POA: Diagnosis not present

## 2020-09-16 DIAGNOSIS — E1129 Type 2 diabetes mellitus with other diabetic kidney complication: Secondary | ICD-10-CM | POA: Diagnosis not present

## 2020-09-17 DIAGNOSIS — N186 End stage renal disease: Secondary | ICD-10-CM | POA: Diagnosis not present

## 2020-09-17 DIAGNOSIS — E876 Hypokalemia: Secondary | ICD-10-CM | POA: Diagnosis not present

## 2020-09-17 DIAGNOSIS — I429 Cardiomyopathy, unspecified: Secondary | ICD-10-CM | POA: Diagnosis not present

## 2020-09-17 DIAGNOSIS — Z87891 Personal history of nicotine dependence: Secondary | ICD-10-CM | POA: Diagnosis not present

## 2020-09-17 DIAGNOSIS — E1122 Type 2 diabetes mellitus with diabetic chronic kidney disease: Secondary | ICD-10-CM | POA: Diagnosis not present

## 2020-09-17 DIAGNOSIS — Z48815 Encounter for surgical aftercare following surgery on the digestive system: Secondary | ICD-10-CM | POA: Diagnosis not present

## 2020-09-17 DIAGNOSIS — J9611 Chronic respiratory failure with hypoxia: Secondary | ICD-10-CM | POA: Diagnosis not present

## 2020-09-17 DIAGNOSIS — I82409 Acute embolism and thrombosis of unspecified deep veins of unspecified lower extremity: Secondary | ICD-10-CM | POA: Diagnosis not present

## 2020-09-17 DIAGNOSIS — I2510 Atherosclerotic heart disease of native coronary artery without angina pectoris: Secondary | ICD-10-CM | POA: Diagnosis not present

## 2020-09-17 DIAGNOSIS — F5101 Primary insomnia: Secondary | ICD-10-CM | POA: Diagnosis not present

## 2020-09-17 DIAGNOSIS — I482 Chronic atrial fibrillation, unspecified: Secondary | ICD-10-CM | POA: Diagnosis not present

## 2020-09-17 DIAGNOSIS — Z992 Dependence on renal dialysis: Secondary | ICD-10-CM | POA: Diagnosis not present

## 2020-09-17 DIAGNOSIS — Z9981 Dependence on supplemental oxygen: Secondary | ICD-10-CM | POA: Diagnosis not present

## 2020-09-17 DIAGNOSIS — I5032 Chronic diastolic (congestive) heart failure: Secondary | ICD-10-CM | POA: Diagnosis not present

## 2020-09-17 DIAGNOSIS — D6949 Other primary thrombocytopenia: Secondary | ICD-10-CM | POA: Diagnosis not present

## 2020-09-17 DIAGNOSIS — E782 Mixed hyperlipidemia: Secondary | ICD-10-CM | POA: Diagnosis not present

## 2020-09-17 DIAGNOSIS — I452 Bifascicular block: Secondary | ICD-10-CM | POA: Diagnosis not present

## 2020-09-17 DIAGNOSIS — N179 Acute kidney failure, unspecified: Secondary | ICD-10-CM | POA: Diagnosis not present

## 2020-09-17 DIAGNOSIS — E1151 Type 2 diabetes mellitus with diabetic peripheral angiopathy without gangrene: Secondary | ICD-10-CM | POA: Diagnosis not present

## 2020-09-17 DIAGNOSIS — I132 Hypertensive heart and chronic kidney disease with heart failure and with stage 5 chronic kidney disease, or end stage renal disease: Secondary | ICD-10-CM | POA: Diagnosis not present

## 2020-09-17 DIAGNOSIS — D509 Iron deficiency anemia, unspecified: Secondary | ICD-10-CM | POA: Diagnosis not present

## 2020-09-18 DIAGNOSIS — R197 Diarrhea, unspecified: Secondary | ICD-10-CM | POA: Diagnosis not present

## 2020-09-18 DIAGNOSIS — N179 Acute kidney failure, unspecified: Secondary | ICD-10-CM | POA: Diagnosis not present

## 2020-09-18 DIAGNOSIS — Z992 Dependence on renal dialysis: Secondary | ICD-10-CM | POA: Diagnosis not present

## 2020-09-18 DIAGNOSIS — N2581 Secondary hyperparathyroidism of renal origin: Secondary | ICD-10-CM | POA: Diagnosis not present

## 2020-09-18 DIAGNOSIS — D509 Iron deficiency anemia, unspecified: Secondary | ICD-10-CM | POA: Diagnosis not present

## 2020-09-18 DIAGNOSIS — E1129 Type 2 diabetes mellitus with other diabetic kidney complication: Secondary | ICD-10-CM | POA: Diagnosis not present

## 2020-09-20 DIAGNOSIS — E1122 Type 2 diabetes mellitus with diabetic chronic kidney disease: Secondary | ICD-10-CM | POA: Diagnosis not present

## 2020-09-20 DIAGNOSIS — N179 Acute kidney failure, unspecified: Secondary | ICD-10-CM | POA: Diagnosis not present

## 2020-09-20 DIAGNOSIS — I82409 Acute embolism and thrombosis of unspecified deep veins of unspecified lower extremity: Secondary | ICD-10-CM | POA: Diagnosis not present

## 2020-09-20 DIAGNOSIS — I452 Bifascicular block: Secondary | ICD-10-CM | POA: Diagnosis not present

## 2020-09-20 DIAGNOSIS — I5032 Chronic diastolic (congestive) heart failure: Secondary | ICD-10-CM | POA: Diagnosis not present

## 2020-09-20 DIAGNOSIS — I2510 Atherosclerotic heart disease of native coronary artery without angina pectoris: Secondary | ICD-10-CM | POA: Diagnosis not present

## 2020-09-20 DIAGNOSIS — I482 Chronic atrial fibrillation, unspecified: Secondary | ICD-10-CM | POA: Diagnosis not present

## 2020-09-20 DIAGNOSIS — Z87891 Personal history of nicotine dependence: Secondary | ICD-10-CM | POA: Diagnosis not present

## 2020-09-20 DIAGNOSIS — J9611 Chronic respiratory failure with hypoxia: Secondary | ICD-10-CM | POA: Diagnosis not present

## 2020-09-20 DIAGNOSIS — Z48815 Encounter for surgical aftercare following surgery on the digestive system: Secondary | ICD-10-CM | POA: Diagnosis not present

## 2020-09-20 DIAGNOSIS — E782 Mixed hyperlipidemia: Secondary | ICD-10-CM | POA: Diagnosis not present

## 2020-09-20 DIAGNOSIS — E876 Hypokalemia: Secondary | ICD-10-CM | POA: Diagnosis not present

## 2020-09-20 DIAGNOSIS — N186 End stage renal disease: Secondary | ICD-10-CM | POA: Diagnosis not present

## 2020-09-20 DIAGNOSIS — D509 Iron deficiency anemia, unspecified: Secondary | ICD-10-CM | POA: Diagnosis not present

## 2020-09-20 DIAGNOSIS — Z9981 Dependence on supplemental oxygen: Secondary | ICD-10-CM | POA: Diagnosis not present

## 2020-09-20 DIAGNOSIS — I132 Hypertensive heart and chronic kidney disease with heart failure and with stage 5 chronic kidney disease, or end stage renal disease: Secondary | ICD-10-CM | POA: Diagnosis not present

## 2020-09-20 DIAGNOSIS — Z992 Dependence on renal dialysis: Secondary | ICD-10-CM | POA: Diagnosis not present

## 2020-09-20 DIAGNOSIS — E1151 Type 2 diabetes mellitus with diabetic peripheral angiopathy without gangrene: Secondary | ICD-10-CM | POA: Diagnosis not present

## 2020-09-20 DIAGNOSIS — D6949 Other primary thrombocytopenia: Secondary | ICD-10-CM | POA: Diagnosis not present

## 2020-09-20 DIAGNOSIS — I429 Cardiomyopathy, unspecified: Secondary | ICD-10-CM | POA: Diagnosis not present

## 2020-09-20 DIAGNOSIS — F5101 Primary insomnia: Secondary | ICD-10-CM | POA: Diagnosis not present

## 2020-09-21 DIAGNOSIS — N179 Acute kidney failure, unspecified: Secondary | ICD-10-CM | POA: Diagnosis not present

## 2020-09-21 DIAGNOSIS — Z992 Dependence on renal dialysis: Secondary | ICD-10-CM | POA: Diagnosis not present

## 2020-09-21 DIAGNOSIS — N2581 Secondary hyperparathyroidism of renal origin: Secondary | ICD-10-CM | POA: Diagnosis not present

## 2020-09-21 DIAGNOSIS — D509 Iron deficiency anemia, unspecified: Secondary | ICD-10-CM | POA: Diagnosis not present

## 2020-09-22 DIAGNOSIS — I82409 Acute embolism and thrombosis of unspecified deep veins of unspecified lower extremity: Secondary | ICD-10-CM | POA: Diagnosis not present

## 2020-09-22 DIAGNOSIS — I2510 Atherosclerotic heart disease of native coronary artery without angina pectoris: Secondary | ICD-10-CM | POA: Diagnosis not present

## 2020-09-22 DIAGNOSIS — D509 Iron deficiency anemia, unspecified: Secondary | ICD-10-CM | POA: Diagnosis not present

## 2020-09-22 DIAGNOSIS — N186 End stage renal disease: Secondary | ICD-10-CM | POA: Diagnosis not present

## 2020-09-22 DIAGNOSIS — N179 Acute kidney failure, unspecified: Secondary | ICD-10-CM | POA: Diagnosis not present

## 2020-09-22 DIAGNOSIS — E876 Hypokalemia: Secondary | ICD-10-CM | POA: Diagnosis not present

## 2020-09-22 DIAGNOSIS — I482 Chronic atrial fibrillation, unspecified: Secondary | ICD-10-CM | POA: Diagnosis not present

## 2020-09-22 DIAGNOSIS — E782 Mixed hyperlipidemia: Secondary | ICD-10-CM | POA: Diagnosis not present

## 2020-09-22 DIAGNOSIS — I5032 Chronic diastolic (congestive) heart failure: Secondary | ICD-10-CM | POA: Diagnosis not present

## 2020-09-22 DIAGNOSIS — E1122 Type 2 diabetes mellitus with diabetic chronic kidney disease: Secondary | ICD-10-CM | POA: Diagnosis not present

## 2020-09-22 DIAGNOSIS — Z9981 Dependence on supplemental oxygen: Secondary | ICD-10-CM | POA: Diagnosis not present

## 2020-09-22 DIAGNOSIS — I429 Cardiomyopathy, unspecified: Secondary | ICD-10-CM | POA: Diagnosis not present

## 2020-09-22 DIAGNOSIS — Z992 Dependence on renal dialysis: Secondary | ICD-10-CM | POA: Diagnosis not present

## 2020-09-22 DIAGNOSIS — E1151 Type 2 diabetes mellitus with diabetic peripheral angiopathy without gangrene: Secondary | ICD-10-CM | POA: Diagnosis not present

## 2020-09-22 DIAGNOSIS — Z87891 Personal history of nicotine dependence: Secondary | ICD-10-CM | POA: Diagnosis not present

## 2020-09-22 DIAGNOSIS — J9611 Chronic respiratory failure with hypoxia: Secondary | ICD-10-CM | POA: Diagnosis not present

## 2020-09-22 DIAGNOSIS — Z48815 Encounter for surgical aftercare following surgery on the digestive system: Secondary | ICD-10-CM | POA: Diagnosis not present

## 2020-09-22 DIAGNOSIS — D6949 Other primary thrombocytopenia: Secondary | ICD-10-CM | POA: Diagnosis not present

## 2020-09-22 DIAGNOSIS — I132 Hypertensive heart and chronic kidney disease with heart failure and with stage 5 chronic kidney disease, or end stage renal disease: Secondary | ICD-10-CM | POA: Diagnosis not present

## 2020-09-22 DIAGNOSIS — I452 Bifascicular block: Secondary | ICD-10-CM | POA: Diagnosis not present

## 2020-09-22 DIAGNOSIS — F5101 Primary insomnia: Secondary | ICD-10-CM | POA: Diagnosis not present

## 2020-09-23 DIAGNOSIS — N2581 Secondary hyperparathyroidism of renal origin: Secondary | ICD-10-CM | POA: Diagnosis not present

## 2020-09-23 DIAGNOSIS — Z992 Dependence on renal dialysis: Secondary | ICD-10-CM | POA: Diagnosis not present

## 2020-09-23 DIAGNOSIS — D509 Iron deficiency anemia, unspecified: Secondary | ICD-10-CM | POA: Diagnosis not present

## 2020-09-23 DIAGNOSIS — N179 Acute kidney failure, unspecified: Secondary | ICD-10-CM | POA: Diagnosis not present

## 2020-09-27 ENCOUNTER — Other Ambulatory Visit: Payer: Self-pay

## 2020-09-27 ENCOUNTER — Encounter: Payer: Self-pay | Admitting: Legal Medicine

## 2020-09-27 ENCOUNTER — Ambulatory Visit (INDEPENDENT_AMBULATORY_CARE_PROVIDER_SITE_OTHER): Payer: Medicare Other | Admitting: Legal Medicine

## 2020-09-27 VITALS — BP 120/82 | HR 91 | Temp 97.4°F | Ht 73.0 in | Wt 290.0 lb

## 2020-09-27 DIAGNOSIS — F5101 Primary insomnia: Secondary | ICD-10-CM | POA: Diagnosis not present

## 2020-09-27 DIAGNOSIS — J9611 Chronic respiratory failure with hypoxia: Secondary | ICD-10-CM | POA: Diagnosis not present

## 2020-09-27 DIAGNOSIS — E782 Mixed hyperlipidemia: Secondary | ICD-10-CM | POA: Diagnosis not present

## 2020-09-27 DIAGNOSIS — I2510 Atherosclerotic heart disease of native coronary artery without angina pectoris: Secondary | ICD-10-CM | POA: Diagnosis not present

## 2020-09-27 DIAGNOSIS — N186 End stage renal disease: Secondary | ICD-10-CM

## 2020-09-27 DIAGNOSIS — Z6838 Body mass index (BMI) 38.0-38.9, adult: Secondary | ICD-10-CM

## 2020-09-27 DIAGNOSIS — D509 Iron deficiency anemia, unspecified: Secondary | ICD-10-CM | POA: Diagnosis not present

## 2020-09-27 DIAGNOSIS — Z48815 Encounter for surgical aftercare following surgery on the digestive system: Secondary | ICD-10-CM | POA: Diagnosis not present

## 2020-09-27 DIAGNOSIS — I452 Bifascicular block: Secondary | ICD-10-CM | POA: Diagnosis not present

## 2020-09-27 DIAGNOSIS — Z9981 Dependence on supplemental oxygen: Secondary | ICD-10-CM | POA: Diagnosis not present

## 2020-09-27 DIAGNOSIS — Z992 Dependence on renal dialysis: Secondary | ICD-10-CM | POA: Diagnosis not present

## 2020-09-27 DIAGNOSIS — I429 Cardiomyopathy, unspecified: Secondary | ICD-10-CM | POA: Diagnosis not present

## 2020-09-27 DIAGNOSIS — E876 Hypokalemia: Secondary | ICD-10-CM | POA: Diagnosis not present

## 2020-09-27 DIAGNOSIS — I209 Angina pectoris, unspecified: Secondary | ICD-10-CM | POA: Diagnosis not present

## 2020-09-27 DIAGNOSIS — E1151 Type 2 diabetes mellitus with diabetic peripheral angiopathy without gangrene: Secondary | ICD-10-CM | POA: Diagnosis not present

## 2020-09-27 DIAGNOSIS — I5032 Chronic diastolic (congestive) heart failure: Secondary | ICD-10-CM | POA: Diagnosis not present

## 2020-09-27 DIAGNOSIS — I132 Hypertensive heart and chronic kidney disease with heart failure and with stage 5 chronic kidney disease, or end stage renal disease: Secondary | ICD-10-CM | POA: Diagnosis not present

## 2020-09-27 DIAGNOSIS — I482 Chronic atrial fibrillation, unspecified: Secondary | ICD-10-CM | POA: Diagnosis not present

## 2020-09-27 DIAGNOSIS — N2581 Secondary hyperparathyroidism of renal origin: Secondary | ICD-10-CM

## 2020-09-27 DIAGNOSIS — E441 Mild protein-calorie malnutrition: Secondary | ICD-10-CM | POA: Diagnosis not present

## 2020-09-27 DIAGNOSIS — I10 Essential (primary) hypertension: Secondary | ICD-10-CM

## 2020-09-27 DIAGNOSIS — I82409 Acute embolism and thrombosis of unspecified deep veins of unspecified lower extremity: Secondary | ICD-10-CM | POA: Diagnosis not present

## 2020-09-27 DIAGNOSIS — D6949 Other primary thrombocytopenia: Secondary | ICD-10-CM | POA: Diagnosis not present

## 2020-09-27 DIAGNOSIS — N179 Acute kidney failure, unspecified: Secondary | ICD-10-CM | POA: Diagnosis not present

## 2020-09-27 DIAGNOSIS — Z87891 Personal history of nicotine dependence: Secondary | ICD-10-CM | POA: Diagnosis not present

## 2020-09-27 DIAGNOSIS — E1122 Type 2 diabetes mellitus with diabetic chronic kidney disease: Secondary | ICD-10-CM | POA: Diagnosis not present

## 2020-09-27 NOTE — Progress Notes (Addendum)
Subjective:  Patient ID: Steven Hester, male    DOB: 11-Nov-1945  Age: 75 y.o. MRN: [id number]  Chief Complaint  Patient presents with  . Hypertension    HPI: discharged from hospital from surgery and subsequent sepsis.  Eating and drinking well but he is still weak.  Easily fatigued. He is on dialysis and doing well next one is tomorrow.   Current Outpatient Medications on File Prior to Visit  Medication Sig Dispense Refill  . glucose blood test strip 1 each by Other route 2 (two) times daily. Use as instructed 100 each 3  . midodrine (PROAMATINE) 5 MG tablet Take 5 mg by mouth 2 (two) times daily.    Marland Kitchen oxyCODONE (OXY IR/ROXICODONE) 5 MG immediate release tablet Take by mouth.    . pantoprazole (PROTONIX) 40 MG tablet Take 1 tablet by mouth 2 (two) times daily.    . traZODone (DESYREL) 50 MG tablet Take 0.5-1 tablets (25-50 mg total) by mouth at bedtime as needed for sleep. 30 tablet 3  . Tuberculin PPD (TUBERSOL ID) Inject into the skin.    Marland Kitchen nitroGLYCERIN (NITROSTAT) 0.4 MG SL tablet Place under the tongue.     No current facility-administered medications on file prior to visit.   Past Medical History:  Diagnosis Date  . Chronic atrial fibrillation ([place])   . Essential hypertension   . HLD (hyperlipidemia)   . Mixed hyperlipidemia   . Other primary thrombocytopenia ([place]) 08/27/2019  . Type 2 diabetes mellitus with other specified complication [facility])    Past Surgical History:  Procedure Laterality Date  . CORONARY ANGIOPLASTY WITH STENT PLACEMENT      Family History  Problem Relation Age of Onset  . Heart attack Mother   . GI Disease Father   . Cancer Father    Social History   Socioeconomic History  . Marital status: Married    Spouse name: Not on file  . Number of children: Not on file  . Years of education: Not on file  . Highest education level: Not on file  Occupational History  . Not on file  Tobacco Use  . Smoking status: Former Smoker    Packs/day:  1.00    Years: 10.00    Pack years: 10.00    Types: Cigarettes  . Smokeless tobacco: Never Used  Substance and Sexual Activity  . Alcohol use: Yes    Comment: occaionally   . Drug use: Never  . Sexual activity: Not on file  Other Topics Concern  . Not on file  Social History Narrative  . Not on file   Social Determinants of Health   Financial Resource Strain: Low Risk   . Difficulty of Paying Living Expenses: Not hard at all  Food Insecurity: No Food Insecurity  . Worried About Charity fundraiser in the Last Year: Never true  . Ran Out of Food in the Last Year: Never true  Transportation Needs: No Transportation Needs  . Lack of Transportation (Medical): No  . Lack of Transportation (Non-Medical): No  Physical Activity: Insufficiently Active  . Days of Exercise per Week: 2 days  . Minutes of Exercise per Session: 30 min  Stress: No Stress Concern Present  . Feeling of Stress : Not at all  Social Connections: Moderately Isolated  . Frequency of Communication with Friends and Family: Three times a week  . Frequency of Social Gatherings with Friends and Family: Never  . Attends Religious Services: Never  . Active Member of  Clubs or Organizations: No  . Attends Archivist Meetings: Never  . Marital Status: Married    Review of Systems  Constitutional: Negative for activity change and appetite change.  HENT: Negative for congestion and sinus pain.   Respiratory: Negative for chest tightness and shortness of breath.   Cardiovascular: Negative for chest pain, palpitations and leg swelling.  Gastrointestinal: Negative for abdominal distention and abdominal pain.  Genitourinary: Negative for difficulty urinating.  Musculoskeletal: Negative for arthralgias and back pain.  Skin: Negative.   Neurological: Positive for weakness.  Psychiatric/Behavioral: Negative.      Objective:  BP 120/82 (BP Location: Right Arm, Patient Position: Sitting, Cuff Size: Normal)    Pulse 91   Temp (!) 97.4 F (36.3 C) (Temporal)   Ht 6\' 1"  (1.854 m)   Wt 290 lb (131.5 kg)   SpO2 97%   BMI 38.26 kg/m   BP/Weight 09/27/2020 09/01/2020 73/22/0254  Systolic BP 270 623 762  Diastolic BP 82 76 68  Wt. (Lbs) 290 287 302  BMI 38.26 37.87 39.84    Physical Exam Vitals reviewed.  Constitutional:      General: He is not in acute distress.    Appearance: Normal appearance.  HENT:     Right Ear: Tympanic membrane normal.     Left Ear: Tympanic membrane normal.  Eyes:     Extraocular Movements: Extraocular movements intact.     Conjunctiva/sclera: Conjunctivae normal.     Pupils: Pupils are equal, round, and reactive to light.  Cardiovascular:     Rate and Rhythm: Normal rate and regular rhythm.     Pulses: Normal pulses.     Heart sounds: No murmur heard. No gallop.   Pulmonary:     Effort: Pulmonary effort is normal. No respiratory distress.     Breath sounds: No wheezing.  Abdominal:     General: Abdomen is flat. Bowel sounds are normal. There is no distension.     Tenderness: There is no abdominal tenderness.     Comments: Incision healed  Musculoskeletal:        General: Normal range of motion.     Cervical back: Normal range of motion and neck supple.  Skin:    General: Skin is warm.     Capillary Refill: Capillary refill takes less than 2 seconds.  Neurological:     General: No focal deficit present.     Mental Status: He is alert and oriented to person, place, and time. Mental status is at baseline.  Psychiatric:        Mood and Affect: Mood normal.        Thought Content: Thought content normal.     Diabetic Foot Exam - Simple   Simple Foot Form Diabetic Foot exam was performed with the following findings: Yes 09/27/2020  3:25 PM  Visual Inspection No deformities, no ulcerations, no other skin breakdown bilaterally: Yes Sensation Testing Intact to touch and monofilament testing bilaterally: Yes Pulse Check Posterior Tibialis and Dorsalis  pulse intact bilaterally: Yes Comments      Lab Results  Component Value Date   WBC 6.9 05/06/2020   HGB 13.8 05/06/2020   HCT 41.5 05/06/2020   PLT 103 (L) 05/06/2020   GLUCOSE 93 05/06/2020   CHOL 143 05/06/2020   TRIG 99 05/06/2020   HDL 43 05/06/2020   LDLCALC 82 05/06/2020   ALT 17 05/06/2020   AST 22 05/06/2020   NA 144 05/06/2020   K 4.2 05/06/2020   CL  108 (H) 05/06/2020   CREATININE 1.59 (H) 05/06/2020   BUN 18 05/06/2020   CO2 22 05/06/2020   HGBA1C 5.2 09/27/2020      Assessment & Plan:   1. Mild protein-calorie malnutrition ([place]) Patient has been losing weight\ Supplement nutrition with protein/calorie supplement with meals to improve nutritional status.  2. Secondary hyperparathyroidism of renal origin [facility]) Patient has secondary hyperparathyroidism, he is already on dialysis.  BP controlled.  3. Angina pectoris, unspecified ([place]) An individual plan was formulated based on patient history and exam, labs and evidence based data. Patient has not had recent angina or nitroglycerin use. continue present treatment.  4. Essential hypertension An individual hypertension care plan was established and reinforced today.  The patient's status was assessed using clinical findings on exam and labs or diagnostic tests. The patient's success at meeting treatment goals on disease specific evidence-based guidelines and found to be well controlled. SELF MANAGEMENT: The patient and I together assessed ways to personally work towards obtaining the recommended goals. RECOMMENDATIONS: avoid decongestants found in common cold remedies, decrease consumption of alcohol, perform routine monitoring of BP with home BP cuff, exercise, reduction of dietary salt, take medicines as prescribed, try not to miss doses and quit smoking.  Regular exercise and maintaining a healthy weight is needed.  Stress reduction may help. A CLINICAL SUMMARY including written plan identify barriers to care  unique to individual due to social or financial issues.  We attempt to mutually creat solutions for individual and family understanding.  5. Diabetes mellitus type 2 with peripheral artery disease ([place]) - Hemoglobin A1c An individual care plan for diabetes was established and reinforced today.  The patient's status was assessed using clinical findings on exam, labs and diagnostic testing. Patient success at meeting goals based on disease specific evidence-based guidelines and found to be fair controlled. Medications were assessed and patient's understanding of the medical issues , including barriers were assessed. Recommend adherence to a diabetic diet, a graduated exercise program, HgbA1c level is checked quarterly, and urine microalbumin performed yearly .  Annual mono-filament sensation testing performed. Lower blood pressure and control hyperlipidemia is important. Get annual eye exams and annual flu shots and smoking cessation discussed.  Self management goals were discussed.  6. Chronic kidney disease requiring chronic dialysis ([place]) AN INDIVIDUAL CARE PLAN for renal disease was established and reinforced today.  The patient's status was assessed using clinical findings on exam, labs, and other diagnostic testing. Patient's success at meeting treatment goals based on disease specific evidence-bassed guidelines and found to be in fair control. RECOMMENDATIONS include maintaining present medicines and treatment.  Diagnoses and all orders for this visit: Morbid obesity [facility]) An individualize plan was formulated for obesity using patient history and physical exam to encourage weight loss.  An evidence based program was formulated.  Patient is to cut portion size with meals and to plan physical exercise 3 days a week at least 20 minutes.  Weight watchers and other programs are helpful.  Planned amount of weight loss 10 lbs. BMI 38.0-38.9,adult An individualize plan was formulated for obesity using  patient history and physical exam to encourage weight loss.  An evidence based program was formulated.  Patient is to cut portion size and size of plate with meals and to plan physical exercise 3 days a week at least 20 minutes.  Weight watchers and other programs are helpful.  Planned amount of weight loss 10 lbs. Patient meets the criteria for morbid obesity with hypertension and renal  disease.   30 minute visit  Orders Placed This Encounter  Procedures  . Hemoglobin A1c     Follow-up: Return in about 3 months (around 12/28/2020) for chronic.  An After Visit Summary was printed and given to the patient.  Reinaldo Meeker, MD [facility] [phone]

## 2020-09-28 DIAGNOSIS — N179 Acute kidney failure, unspecified: Secondary | ICD-10-CM | POA: Diagnosis not present

## 2020-09-28 DIAGNOSIS — Z992 Dependence on renal dialysis: Secondary | ICD-10-CM | POA: Diagnosis not present

## 2020-09-28 DIAGNOSIS — N2581 Secondary hyperparathyroidism of renal origin: Secondary | ICD-10-CM | POA: Diagnosis not present

## 2020-09-28 DIAGNOSIS — D649 Anemia, unspecified: Secondary | ICD-10-CM | POA: Diagnosis not present

## 2020-09-28 LAB — HEMOGLOBIN A1C
Est. average glucose Bld gHb Est-mCnc: 103 mg/dL
Hgb A1c MFr Bld: 5.2 % (ref 4.8–5.6)

## 2020-09-28 NOTE — Progress Notes (Signed)
A1c 5.2 normal lp

## 2020-09-29 DIAGNOSIS — I2510 Atherosclerotic heart disease of native coronary artery without angina pectoris: Secondary | ICD-10-CM | POA: Diagnosis not present

## 2020-09-29 DIAGNOSIS — E1151 Type 2 diabetes mellitus with diabetic peripheral angiopathy without gangrene: Secondary | ICD-10-CM | POA: Diagnosis not present

## 2020-09-29 DIAGNOSIS — E1122 Type 2 diabetes mellitus with diabetic chronic kidney disease: Secondary | ICD-10-CM | POA: Diagnosis not present

## 2020-09-29 DIAGNOSIS — Z992 Dependence on renal dialysis: Secondary | ICD-10-CM | POA: Diagnosis not present

## 2020-09-29 DIAGNOSIS — N186 End stage renal disease: Secondary | ICD-10-CM | POA: Diagnosis not present

## 2020-09-29 DIAGNOSIS — E782 Mixed hyperlipidemia: Secondary | ICD-10-CM | POA: Diagnosis not present

## 2020-09-29 DIAGNOSIS — I429 Cardiomyopathy, unspecified: Secondary | ICD-10-CM | POA: Diagnosis not present

## 2020-09-29 DIAGNOSIS — Z48815 Encounter for surgical aftercare following surgery on the digestive system: Secondary | ICD-10-CM | POA: Diagnosis not present

## 2020-09-29 DIAGNOSIS — I452 Bifascicular block: Secondary | ICD-10-CM | POA: Diagnosis not present

## 2020-09-29 DIAGNOSIS — N179 Acute kidney failure, unspecified: Secondary | ICD-10-CM | POA: Diagnosis not present

## 2020-09-29 DIAGNOSIS — Z9981 Dependence on supplemental oxygen: Secondary | ICD-10-CM | POA: Diagnosis not present

## 2020-09-29 DIAGNOSIS — J9611 Chronic respiratory failure with hypoxia: Secondary | ICD-10-CM | POA: Diagnosis not present

## 2020-09-29 DIAGNOSIS — F5101 Primary insomnia: Secondary | ICD-10-CM | POA: Diagnosis not present

## 2020-09-29 DIAGNOSIS — D6949 Other primary thrombocytopenia: Secondary | ICD-10-CM | POA: Diagnosis not present

## 2020-09-29 DIAGNOSIS — E876 Hypokalemia: Secondary | ICD-10-CM | POA: Diagnosis not present

## 2020-09-29 DIAGNOSIS — I482 Chronic atrial fibrillation, unspecified: Secondary | ICD-10-CM | POA: Diagnosis not present

## 2020-09-29 DIAGNOSIS — I82409 Acute embolism and thrombosis of unspecified deep veins of unspecified lower extremity: Secondary | ICD-10-CM | POA: Diagnosis not present

## 2020-09-29 DIAGNOSIS — Z87891 Personal history of nicotine dependence: Secondary | ICD-10-CM | POA: Diagnosis not present

## 2020-09-29 DIAGNOSIS — D509 Iron deficiency anemia, unspecified: Secondary | ICD-10-CM | POA: Diagnosis not present

## 2020-09-29 DIAGNOSIS — I5032 Chronic diastolic (congestive) heart failure: Secondary | ICD-10-CM | POA: Diagnosis not present

## 2020-09-29 DIAGNOSIS — I132 Hypertensive heart and chronic kidney disease with heart failure and with stage 5 chronic kidney disease, or end stage renal disease: Secondary | ICD-10-CM | POA: Diagnosis not present

## 2020-09-30 DIAGNOSIS — D649 Anemia, unspecified: Secondary | ICD-10-CM | POA: Diagnosis not present

## 2020-09-30 DIAGNOSIS — Z992 Dependence on renal dialysis: Secondary | ICD-10-CM | POA: Diagnosis not present

## 2020-09-30 DIAGNOSIS — N179 Acute kidney failure, unspecified: Secondary | ICD-10-CM | POA: Diagnosis not present

## 2020-09-30 DIAGNOSIS — N2581 Secondary hyperparathyroidism of renal origin: Secondary | ICD-10-CM | POA: Diagnosis not present

## 2020-10-01 DIAGNOSIS — N186 End stage renal disease: Secondary | ICD-10-CM | POA: Diagnosis not present

## 2020-10-01 DIAGNOSIS — E876 Hypokalemia: Secondary | ICD-10-CM | POA: Diagnosis not present

## 2020-10-01 DIAGNOSIS — Z992 Dependence on renal dialysis: Secondary | ICD-10-CM | POA: Diagnosis not present

## 2020-10-01 DIAGNOSIS — Z9981 Dependence on supplemental oxygen: Secondary | ICD-10-CM | POA: Diagnosis not present

## 2020-10-01 DIAGNOSIS — J9611 Chronic respiratory failure with hypoxia: Secondary | ICD-10-CM | POA: Diagnosis not present

## 2020-10-01 DIAGNOSIS — E782 Mixed hyperlipidemia: Secondary | ICD-10-CM | POA: Diagnosis not present

## 2020-10-01 DIAGNOSIS — I429 Cardiomyopathy, unspecified: Secondary | ICD-10-CM | POA: Diagnosis not present

## 2020-10-01 DIAGNOSIS — I452 Bifascicular block: Secondary | ICD-10-CM | POA: Diagnosis not present

## 2020-10-01 DIAGNOSIS — F5101 Primary insomnia: Secondary | ICD-10-CM | POA: Diagnosis not present

## 2020-10-01 DIAGNOSIS — Z48815 Encounter for surgical aftercare following surgery on the digestive system: Secondary | ICD-10-CM | POA: Diagnosis not present

## 2020-10-01 DIAGNOSIS — I482 Chronic atrial fibrillation, unspecified: Secondary | ICD-10-CM | POA: Diagnosis not present

## 2020-10-01 DIAGNOSIS — N179 Acute kidney failure, unspecified: Secondary | ICD-10-CM | POA: Diagnosis not present

## 2020-10-01 DIAGNOSIS — I132 Hypertensive heart and chronic kidney disease with heart failure and with stage 5 chronic kidney disease, or end stage renal disease: Secondary | ICD-10-CM | POA: Diagnosis not present

## 2020-10-01 DIAGNOSIS — E1151 Type 2 diabetes mellitus with diabetic peripheral angiopathy without gangrene: Secondary | ICD-10-CM | POA: Diagnosis not present

## 2020-10-01 DIAGNOSIS — D6949 Other primary thrombocytopenia: Secondary | ICD-10-CM | POA: Diagnosis not present

## 2020-10-01 DIAGNOSIS — I2510 Atherosclerotic heart disease of native coronary artery without angina pectoris: Secondary | ICD-10-CM | POA: Diagnosis not present

## 2020-10-01 DIAGNOSIS — D509 Iron deficiency anemia, unspecified: Secondary | ICD-10-CM | POA: Diagnosis not present

## 2020-10-01 DIAGNOSIS — E1122 Type 2 diabetes mellitus with diabetic chronic kidney disease: Secondary | ICD-10-CM | POA: Diagnosis not present

## 2020-10-01 DIAGNOSIS — I5032 Chronic diastolic (congestive) heart failure: Secondary | ICD-10-CM | POA: Diagnosis not present

## 2020-10-01 DIAGNOSIS — I82409 Acute embolism and thrombosis of unspecified deep veins of unspecified lower extremity: Secondary | ICD-10-CM | POA: Diagnosis not present

## 2020-10-01 DIAGNOSIS — Z87891 Personal history of nicotine dependence: Secondary | ICD-10-CM | POA: Diagnosis not present

## 2020-10-03 DIAGNOSIS — J9611 Chronic respiratory failure with hypoxia: Secondary | ICD-10-CM | POA: Diagnosis not present

## 2020-10-05 DIAGNOSIS — J9611 Chronic respiratory failure with hypoxia: Secondary | ICD-10-CM | POA: Diagnosis not present

## 2020-10-06 DIAGNOSIS — I10 Essential (primary) hypertension: Secondary | ICD-10-CM | POA: Diagnosis not present

## 2020-10-06 DIAGNOSIS — I252 Old myocardial infarction: Secondary | ICD-10-CM | POA: Diagnosis not present

## 2020-10-07 DIAGNOSIS — D509 Iron deficiency anemia, unspecified: Secondary | ICD-10-CM | POA: Diagnosis not present

## 2020-10-07 DIAGNOSIS — I132 Hypertensive heart and chronic kidney disease with heart failure and with stage 5 chronic kidney disease, or end stage renal disease: Secondary | ICD-10-CM | POA: Diagnosis not present

## 2020-10-07 DIAGNOSIS — N186 End stage renal disease: Secondary | ICD-10-CM | POA: Diagnosis not present

## 2020-10-07 DIAGNOSIS — D6949 Other primary thrombocytopenia: Secondary | ICD-10-CM | POA: Diagnosis not present

## 2020-10-07 DIAGNOSIS — I482 Chronic atrial fibrillation, unspecified: Secondary | ICD-10-CM | POA: Diagnosis not present

## 2020-10-07 DIAGNOSIS — Z9981 Dependence on supplemental oxygen: Secondary | ICD-10-CM | POA: Diagnosis not present

## 2020-10-07 DIAGNOSIS — I5032 Chronic diastolic (congestive) heart failure: Secondary | ICD-10-CM | POA: Diagnosis not present

## 2020-10-07 DIAGNOSIS — E782 Mixed hyperlipidemia: Secondary | ICD-10-CM | POA: Diagnosis not present

## 2020-10-07 DIAGNOSIS — I429 Cardiomyopathy, unspecified: Secondary | ICD-10-CM | POA: Diagnosis not present

## 2020-10-07 DIAGNOSIS — Z48815 Encounter for surgical aftercare following surgery on the digestive system: Secondary | ICD-10-CM | POA: Diagnosis not present

## 2020-10-07 DIAGNOSIS — Z992 Dependence on renal dialysis: Secondary | ICD-10-CM | POA: Diagnosis not present

## 2020-10-07 DIAGNOSIS — J9611 Chronic respiratory failure with hypoxia: Secondary | ICD-10-CM | POA: Diagnosis not present

## 2020-10-07 DIAGNOSIS — E1151 Type 2 diabetes mellitus with diabetic peripheral angiopathy without gangrene: Secondary | ICD-10-CM | POA: Diagnosis not present

## 2020-10-07 DIAGNOSIS — I452 Bifascicular block: Secondary | ICD-10-CM | POA: Diagnosis not present

## 2020-10-07 DIAGNOSIS — Z87891 Personal history of nicotine dependence: Secondary | ICD-10-CM | POA: Diagnosis not present

## 2020-10-07 DIAGNOSIS — E876 Hypokalemia: Secondary | ICD-10-CM | POA: Diagnosis not present

## 2020-10-07 DIAGNOSIS — N179 Acute kidney failure, unspecified: Secondary | ICD-10-CM | POA: Diagnosis not present

## 2020-10-07 DIAGNOSIS — I82409 Acute embolism and thrombosis of unspecified deep veins of unspecified lower extremity: Secondary | ICD-10-CM | POA: Diagnosis not present

## 2020-10-07 DIAGNOSIS — I2510 Atherosclerotic heart disease of native coronary artery without angina pectoris: Secondary | ICD-10-CM | POA: Diagnosis not present

## 2020-10-07 DIAGNOSIS — E1122 Type 2 diabetes mellitus with diabetic chronic kidney disease: Secondary | ICD-10-CM | POA: Diagnosis not present

## 2020-10-07 DIAGNOSIS — F5101 Primary insomnia: Secondary | ICD-10-CM | POA: Diagnosis not present

## 2020-10-12 DIAGNOSIS — E1151 Type 2 diabetes mellitus with diabetic peripheral angiopathy without gangrene: Secondary | ICD-10-CM | POA: Diagnosis not present

## 2020-10-12 DIAGNOSIS — I482 Chronic atrial fibrillation, unspecified: Secondary | ICD-10-CM | POA: Diagnosis not present

## 2020-10-12 DIAGNOSIS — D6949 Other primary thrombocytopenia: Secondary | ICD-10-CM | POA: Diagnosis not present

## 2020-10-12 DIAGNOSIS — I82409 Acute embolism and thrombosis of unspecified deep veins of unspecified lower extremity: Secondary | ICD-10-CM | POA: Diagnosis not present

## 2020-10-12 DIAGNOSIS — E1122 Type 2 diabetes mellitus with diabetic chronic kidney disease: Secondary | ICD-10-CM | POA: Diagnosis not present

## 2020-10-12 DIAGNOSIS — D509 Iron deficiency anemia, unspecified: Secondary | ICD-10-CM | POA: Diagnosis not present

## 2020-10-12 DIAGNOSIS — N179 Acute kidney failure, unspecified: Secondary | ICD-10-CM | POA: Diagnosis not present

## 2020-10-12 DIAGNOSIS — I5032 Chronic diastolic (congestive) heart failure: Secondary | ICD-10-CM | POA: Diagnosis not present

## 2020-10-12 DIAGNOSIS — Z9981 Dependence on supplemental oxygen: Secondary | ICD-10-CM | POA: Diagnosis not present

## 2020-10-12 DIAGNOSIS — I429 Cardiomyopathy, unspecified: Secondary | ICD-10-CM | POA: Diagnosis not present

## 2020-10-12 DIAGNOSIS — J9611 Chronic respiratory failure with hypoxia: Secondary | ICD-10-CM | POA: Diagnosis not present

## 2020-10-12 DIAGNOSIS — N186 End stage renal disease: Secondary | ICD-10-CM | POA: Diagnosis not present

## 2020-10-12 DIAGNOSIS — I452 Bifascicular block: Secondary | ICD-10-CM | POA: Diagnosis not present

## 2020-10-12 DIAGNOSIS — Z87891 Personal history of nicotine dependence: Secondary | ICD-10-CM | POA: Diagnosis not present

## 2020-10-12 DIAGNOSIS — E782 Mixed hyperlipidemia: Secondary | ICD-10-CM | POA: Diagnosis not present

## 2020-10-12 DIAGNOSIS — Z992 Dependence on renal dialysis: Secondary | ICD-10-CM | POA: Diagnosis not present

## 2020-10-12 DIAGNOSIS — E876 Hypokalemia: Secondary | ICD-10-CM | POA: Diagnosis not present

## 2020-10-12 DIAGNOSIS — Z48815 Encounter for surgical aftercare following surgery on the digestive system: Secondary | ICD-10-CM | POA: Diagnosis not present

## 2020-10-12 DIAGNOSIS — I132 Hypertensive heart and chronic kidney disease with heart failure and with stage 5 chronic kidney disease, or end stage renal disease: Secondary | ICD-10-CM | POA: Diagnosis not present

## 2020-10-12 DIAGNOSIS — F5101 Primary insomnia: Secondary | ICD-10-CM | POA: Diagnosis not present

## 2020-10-12 DIAGNOSIS — I2510 Atherosclerotic heart disease of native coronary artery without angina pectoris: Secondary | ICD-10-CM | POA: Diagnosis not present

## 2020-10-13 DIAGNOSIS — Z992 Dependence on renal dialysis: Secondary | ICD-10-CM | POA: Diagnosis not present

## 2020-10-13 DIAGNOSIS — I82409 Acute embolism and thrombosis of unspecified deep veins of unspecified lower extremity: Secondary | ICD-10-CM | POA: Diagnosis not present

## 2020-10-13 DIAGNOSIS — I132 Hypertensive heart and chronic kidney disease with heart failure and with stage 5 chronic kidney disease, or end stage renal disease: Secondary | ICD-10-CM | POA: Diagnosis not present

## 2020-10-13 DIAGNOSIS — J9611 Chronic respiratory failure with hypoxia: Secondary | ICD-10-CM | POA: Diagnosis not present

## 2020-10-13 DIAGNOSIS — N179 Acute kidney failure, unspecified: Secondary | ICD-10-CM | POA: Diagnosis not present

## 2020-10-13 DIAGNOSIS — N186 End stage renal disease: Secondary | ICD-10-CM | POA: Diagnosis not present

## 2020-10-13 DIAGNOSIS — E1122 Type 2 diabetes mellitus with diabetic chronic kidney disease: Secondary | ICD-10-CM | POA: Diagnosis not present

## 2020-10-13 DIAGNOSIS — I482 Chronic atrial fibrillation, unspecified: Secondary | ICD-10-CM | POA: Diagnosis not present

## 2020-10-13 DIAGNOSIS — D509 Iron deficiency anemia, unspecified: Secondary | ICD-10-CM | POA: Diagnosis not present

## 2020-10-13 DIAGNOSIS — Z48815 Encounter for surgical aftercare following surgery on the digestive system: Secondary | ICD-10-CM | POA: Diagnosis not present

## 2020-10-13 DIAGNOSIS — I5032 Chronic diastolic (congestive) heart failure: Secondary | ICD-10-CM | POA: Diagnosis not present

## 2020-10-13 DIAGNOSIS — I429 Cardiomyopathy, unspecified: Secondary | ICD-10-CM | POA: Diagnosis not present

## 2020-10-13 DIAGNOSIS — D6949 Other primary thrombocytopenia: Secondary | ICD-10-CM | POA: Diagnosis not present

## 2020-10-13 DIAGNOSIS — I452 Bifascicular block: Secondary | ICD-10-CM | POA: Diagnosis not present

## 2020-10-13 DIAGNOSIS — E1151 Type 2 diabetes mellitus with diabetic peripheral angiopathy without gangrene: Secondary | ICD-10-CM | POA: Diagnosis not present

## 2020-10-13 DIAGNOSIS — E876 Hypokalemia: Secondary | ICD-10-CM | POA: Diagnosis not present

## 2020-10-13 DIAGNOSIS — E782 Mixed hyperlipidemia: Secondary | ICD-10-CM | POA: Diagnosis not present

## 2020-10-13 DIAGNOSIS — Z9981 Dependence on supplemental oxygen: Secondary | ICD-10-CM | POA: Diagnosis not present

## 2020-10-13 DIAGNOSIS — Z87891 Personal history of nicotine dependence: Secondary | ICD-10-CM | POA: Diagnosis not present

## 2020-10-13 DIAGNOSIS — F5101 Primary insomnia: Secondary | ICD-10-CM | POA: Diagnosis not present

## 2020-10-13 DIAGNOSIS — I2510 Atherosclerotic heart disease of native coronary artery without angina pectoris: Secondary | ICD-10-CM | POA: Diagnosis not present

## 2020-10-14 DIAGNOSIS — Z45018 Encounter for adjustment and management of other part of cardiac pacemaker: Secondary | ICD-10-CM | POA: Diagnosis not present

## 2020-10-14 DIAGNOSIS — R5381 Other malaise: Secondary | ICD-10-CM | POA: Diagnosis not present

## 2020-10-14 DIAGNOSIS — R5383 Other fatigue: Secondary | ICD-10-CM | POA: Diagnosis not present

## 2020-10-14 DIAGNOSIS — Z79899 Other long term (current) drug therapy: Secondary | ICD-10-CM | POA: Diagnosis not present

## 2020-10-14 DIAGNOSIS — I10 Essential (primary) hypertension: Secondary | ICD-10-CM | POA: Diagnosis not present

## 2020-10-14 DIAGNOSIS — I252 Old myocardial infarction: Secondary | ICD-10-CM | POA: Diagnosis not present

## 2020-10-14 DIAGNOSIS — I4819 Other persistent atrial fibrillation: Secondary | ICD-10-CM | POA: Diagnosis not present

## 2020-10-15 DIAGNOSIS — Z992 Dependence on renal dialysis: Secondary | ICD-10-CM | POA: Diagnosis not present

## 2020-10-15 DIAGNOSIS — Z452 Encounter for adjustment and management of vascular access device: Secondary | ICD-10-CM | POA: Diagnosis not present

## 2020-10-15 DIAGNOSIS — N186 End stage renal disease: Secondary | ICD-10-CM | POA: Diagnosis not present

## 2020-10-18 ENCOUNTER — Telehealth: Payer: Self-pay

## 2020-10-18 DIAGNOSIS — I132 Hypertensive heart and chronic kidney disease with heart failure and with stage 5 chronic kidney disease, or end stage renal disease: Secondary | ICD-10-CM | POA: Diagnosis not present

## 2020-10-18 DIAGNOSIS — I5032 Chronic diastolic (congestive) heart failure: Secondary | ICD-10-CM | POA: Diagnosis not present

## 2020-10-18 DIAGNOSIS — I429 Cardiomyopathy, unspecified: Secondary | ICD-10-CM | POA: Diagnosis not present

## 2020-10-18 DIAGNOSIS — Z992 Dependence on renal dialysis: Secondary | ICD-10-CM | POA: Diagnosis not present

## 2020-10-18 DIAGNOSIS — E1122 Type 2 diabetes mellitus with diabetic chronic kidney disease: Secondary | ICD-10-CM | POA: Diagnosis not present

## 2020-10-18 DIAGNOSIS — Z87891 Personal history of nicotine dependence: Secondary | ICD-10-CM | POA: Diagnosis not present

## 2020-10-18 DIAGNOSIS — F5101 Primary insomnia: Secondary | ICD-10-CM | POA: Diagnosis not present

## 2020-10-18 DIAGNOSIS — D509 Iron deficiency anemia, unspecified: Secondary | ICD-10-CM | POA: Diagnosis not present

## 2020-10-18 DIAGNOSIS — N186 End stage renal disease: Secondary | ICD-10-CM | POA: Diagnosis not present

## 2020-10-18 DIAGNOSIS — I82409 Acute embolism and thrombosis of unspecified deep veins of unspecified lower extremity: Secondary | ICD-10-CM | POA: Diagnosis not present

## 2020-10-18 DIAGNOSIS — Z48815 Encounter for surgical aftercare following surgery on the digestive system: Secondary | ICD-10-CM | POA: Diagnosis not present

## 2020-10-18 DIAGNOSIS — Z9981 Dependence on supplemental oxygen: Secondary | ICD-10-CM | POA: Diagnosis not present

## 2020-10-18 DIAGNOSIS — E876 Hypokalemia: Secondary | ICD-10-CM | POA: Diagnosis not present

## 2020-10-18 DIAGNOSIS — E782 Mixed hyperlipidemia: Secondary | ICD-10-CM | POA: Diagnosis not present

## 2020-10-18 DIAGNOSIS — I452 Bifascicular block: Secondary | ICD-10-CM | POA: Diagnosis not present

## 2020-10-18 DIAGNOSIS — N179 Acute kidney failure, unspecified: Secondary | ICD-10-CM | POA: Diagnosis not present

## 2020-10-18 DIAGNOSIS — E1151 Type 2 diabetes mellitus with diabetic peripheral angiopathy without gangrene: Secondary | ICD-10-CM | POA: Diagnosis not present

## 2020-10-18 DIAGNOSIS — J9611 Chronic respiratory failure with hypoxia: Secondary | ICD-10-CM | POA: Diagnosis not present

## 2020-10-18 DIAGNOSIS — I2510 Atherosclerotic heart disease of native coronary artery without angina pectoris: Secondary | ICD-10-CM | POA: Diagnosis not present

## 2020-10-18 DIAGNOSIS — I482 Chronic atrial fibrillation, unspecified: Secondary | ICD-10-CM | POA: Diagnosis not present

## 2020-10-18 DIAGNOSIS — D6949 Other primary thrombocytopenia: Secondary | ICD-10-CM | POA: Diagnosis not present

## 2020-10-18 NOTE — Telephone Encounter (Signed)
Vida Roller from [facility] called to report that Steven Hester stopped dialysis 1 week ago and was started back on warfarin.  He also has been started on Amiodarone 1/2 tab. And Pacerone 1/2 per Dr. Sudie Grumbling office.  They are concerned that he needs to be back on his other medications.  Follow-up scheduled with Dr. Henrene Pastor for Wed.  The nurse is going to fax a copy of his current list and the patient is going to bring all of his medications with him to the appointment.

## 2020-10-20 ENCOUNTER — Other Ambulatory Visit: Payer: Self-pay

## 2020-10-20 ENCOUNTER — Ambulatory Visit (INDEPENDENT_AMBULATORY_CARE_PROVIDER_SITE_OTHER): Payer: Medicare Other | Admitting: Legal Medicine

## 2020-10-20 ENCOUNTER — Encounter: Payer: Self-pay | Admitting: Legal Medicine

## 2020-10-20 VITALS — BP 140/72 | HR 63 | Temp 97.5°F | Resp 16 | Ht 73.0 in | Wt 285.0 lb

## 2020-10-20 DIAGNOSIS — I10 Essential (primary) hypertension: Secondary | ICD-10-CM

## 2020-10-20 DIAGNOSIS — E1151 Type 2 diabetes mellitus with diabetic peripheral angiopathy without gangrene: Secondary | ICD-10-CM

## 2020-10-20 DIAGNOSIS — I5020 Unspecified systolic (congestive) heart failure: Secondary | ICD-10-CM

## 2020-10-20 DIAGNOSIS — I482 Chronic atrial fibrillation, unspecified: Secondary | ICD-10-CM

## 2020-10-20 DIAGNOSIS — E538 Deficiency of other specified B group vitamins: Secondary | ICD-10-CM | POA: Diagnosis not present

## 2020-10-20 DIAGNOSIS — E782 Mixed hyperlipidemia: Secondary | ICD-10-CM

## 2020-10-20 DIAGNOSIS — D508 Other iron deficiency anemias: Secondary | ICD-10-CM | POA: Diagnosis not present

## 2020-10-20 DIAGNOSIS — N189 Chronic kidney disease, unspecified: Secondary | ICD-10-CM | POA: Diagnosis not present

## 2020-10-20 MED ORDER — ATORVASTATIN CALCIUM 40 MG PO TABS: 40.0000 mg | ORAL_TABLET | Freq: Every day | ORAL | 1 refills | Status: DC

## 2020-10-20 NOTE — Assessment & Plan Note (Signed)
EF 40 - 45% 2022

## 2020-10-20 NOTE — Progress Notes (Signed)
Established Patient Office Visit  Subjective:  Patient ID: Steven Hester, male    DOB: [birth year]  Age: 75 y.o. MRN: [id number]  CC:  Chief Complaint  Patient presents with   Palpitations    HPI  Stopped dialysis last week, improved GFR and cardiac medicines have been reduced - Dr. Otho Perl.continue other medicines. Resume other medicines.   Lillard Anes Seib presents for chronic visit  Last 2D echo- Dr. Otho Perl SUMMARY The left ventricle is moderately dilated. Mild left ventricular hypertrophy Left ventricular systolic function is mildly reduced. There is mild global hypokinesis of the left ventricle. Inferior akinesis LV ejection fraction = 40-45%. There is aortic valve sclerosis. There is mild to moderate aortic regurgitation. There is moderate mitral regurgitation. There is mild tricuspid regurgitation. Estimated right ventricular systolic pressure is 39 mmHg. Mild pulmonary hypertension. Compared to the last study dated--LV Function appears mildly reduced  Patient present with type 2 diabetes.  Specifically, this is type 2, noninsulin requiring diabetes, complicated by renal failure.  Compliance with treatment has been good; patient take medicines as directed, maintains diet and exercise regimen, follows up as directed, and is keeping glucose diary.  Date of  diagnosis 2000.  Depression screen has been performed.Tobacco screen nonsmoker. Current medicines for diabetes none.  Patient is on olmesartan for renal protection and atorvastatin 40 for cholesterol control.  Patient performs foot exams daily and last ophthalmologic exam was yes.  Past Medical History:  Diagnosis Date   Chronic atrial fibrillation (HCC)    Essential hypertension    HLD (hyperlipidemia)    Mixed hyperlipidemia    Other primary thrombocytopenia ([place]) 08/27/2019   Type 2 diabetes mellitus with other specified complication [facility])     Past Surgical History:  Procedure Laterality Date   CORONARY  ANGIOPLASTY WITH STENT PLACEMENT      Family History  Problem Relation Age of Onset   Heart attack Mother    GI Disease Father    Cancer Father     Social History   Socioeconomic History   Marital status: Married    Spouse name: Not on file   Number of children: Not on file   Years of education: Not on file   Highest education level: Not on file  Occupational History   Not on file  Tobacco Use   Smoking status: Former    Packs/day: 1.00    Years: 10.00    Pack years: 10.00    Types: Cigarettes   Smokeless tobacco: Never  Substance and Sexual Activity   Alcohol use: Yes    Comment: occaionally    Drug use: Never   Sexual activity: Not on file  Other Topics Concern   Not on file  Social History Narrative   Not on file   Social Determinants of Health   Financial Resource Strain: Low Risk    Difficulty of Paying Living Expenses: Not hard at all  Food Insecurity: No Food Insecurity   Worried About Charity fundraiser in the Last Year: Never true   Ran Out of Food in the Last Year: Never true  Transportation Needs: No Transportation Needs   Lack of Transportation (Medical): No   Lack of Transportation (Non-Medical): No  Physical Activity: Insufficiently Active   Days of Exercise per Week: 2 days   Minutes of Exercise per Session: 30 min  Stress: No Stress Concern Present   Feeling of Stress : Not at all  Social Connections: Moderately Isolated   Frequency of Communication  with Friends and Family: Three times a week   Frequency of Social Gatherings with Friends and Family: Never   Attends Religious Services: Never   Marine scientist or Organizations: No   Attends Music therapist: Never   Marital Status: Married  Human resources officer Violence: Not At Risk   Fear of Current or Ex-Partner: No   Emotionally Abused: No   Physically Abused: No   Sexually Abused: No    Outpatient Medications Prior to Visit  Medication Sig Dispense Refill    amiodarone (PACERONE) 200 MG tablet Take 100 mg by mouth daily.     furosemide (LASIX) 40 MG tablet Take 1 tablet by mouth daily.     glucose blood test strip 1 each by Other route 2 (two) times daily. Use as instructed 100 each 3   oxyCODONE (OXY IR/ROXICODONE) 5 MG immediate release tablet Take by mouth.     sodium bicarbonate 650 MG tablet Take 650 mg by mouth 2 (two) times daily.     warfarin (COUMADIN) 5 MG tablet Take 5 mg by mouth daily.     atorvastatin (LIPITOR) 40 MG tablet Take 40 mg by mouth daily.     Methoxy PEG-Epoetin Beta (MIRCERA IJ) Inject into the skin.     nitroGLYCERIN (NITROSTAT) 0.4 MG SL tablet Place under the tongue.     midodrine (PROAMATINE) 5 MG tablet Take 5 mg by mouth 2 (two) times daily.     pantoprazole (PROTONIX) 40 MG tablet Take 1 tablet by mouth 2 (two) times daily.     traZODone (DESYREL) 50 MG tablet Take 0.5-1 tablets (25-50 mg total) by mouth at bedtime as needed for sleep. 30 tablet 3   Tuberculin PPD (TUBERSOL ID) Inject into the skin.     No facility-administered medications prior to visit.    Allergies  Allergen Reactions   Ezetimibe Other (See Comments)    Zetia-unknown reaction    Neopap [Acetaminophen]    Simvastatin Other (See Comments)   Niacin Rash    Other reaction(s): Unknown    ROS Review of Systems  Constitutional:  Negative for chills, fatigue and fever.  HENT:  Negative for congestion, ear pain and sore throat.   Respiratory:  Negative for cough and shortness of breath.   Cardiovascular:  Positive for palpitations. Negative for chest pain.  Gastrointestinal:  Negative for abdominal pain, constipation, diarrhea, nausea and vomiting.  Endocrine: Negative for polydipsia, polyphagia and polyuria.  Genitourinary:  Negative for dysuria and frequency.  Musculoskeletal:  Negative for arthralgias and myalgias.  Neurological:  Negative for dizziness and headaches.  Psychiatric/Behavioral:  Negative for dysphoric mood.        No  dysphoria     Objective:    Physical Exam Vitals reviewed.  Constitutional:      Appearance: Normal appearance. He is obese.  HENT:     Right Ear: Tympanic membrane, ear canal and external ear normal.     Left Ear: Tympanic membrane, ear canal and external ear normal.     Mouth/Throat:     Mouth: Mucous membranes are moist.     Pharynx: Oropharynx is clear.  Eyes:     Extraocular Movements: Extraocular movements intact.     Conjunctiva/sclera: Conjunctivae normal.     Pupils: Pupils are equal, round, and reactive to light.  Cardiovascular:     Rate and Rhythm: Normal rate and regular rhythm.     Pulses: Normal pulses.     Heart sounds: Normal heart sounds.  No murmur heard.   No gallop.  Pulmonary:     Effort: Pulmonary effort is normal. No respiratory distress.     Breath sounds: No wheezing.  Abdominal:     General: Bowel sounds are normal. There is no distension.     Palpations: Abdomen is soft.     Tenderness: There is no abdominal tenderness.  Musculoskeletal:        General: Normal range of motion.     Cervical back: Normal range of motion and neck supple.     Right lower leg: No edema.     Left lower leg: No edema.  Skin:    General: Skin is warm.     Capillary Refill: Capillary refill takes less than 2 seconds.  Neurological:     General: No focal deficit present.     Mental Status: He is alert and oriented to person, place, and time. Mental status is at baseline.  Psychiatric:        Mood and Affect: Mood normal.    BP 140/72   Pulse 63   Temp (!) 97.5 F (36.4 C)   Resp 16   Ht 6\' 1"  (1.854 m)   Wt 285 lb (129.3 kg)   SpO2 97%   BMI 37.60 kg/m  Wt Readings from Last 3 Encounters:  10/20/20 285 lb (129.3 kg)  09/27/20 290 lb (131.5 kg)  09/01/20 287 lb (130.2 kg)     Health Maintenance Due  Topic Date Due   OPHTHALMOLOGY EXAM  Never done   URINE MICROALBUMIN  Never done   Hepatitis C Screening  Never done   Zoster Vaccines- Shingrix (1 of 2)  Never done   COVID-19 Vaccine (4 - Booster for Moderna series) 07/01/2020    There are no preventive care reminders to display for this patient.  No results found for: TSH Lab Results  Component Value Date   WBC 6.9 05/06/2020   HGB 13.8 05/06/2020   HCT 41.5 05/06/2020   MCV 92 05/06/2020   PLT 103 (L) 05/06/2020   Lab Results  Component Value Date   NA 144 05/06/2020   K 4.2 05/06/2020   CO2 22 05/06/2020   GLUCOSE 93 05/06/2020   BUN 18 05/06/2020   CREATININE 1.59 (H) 05/06/2020   BILITOT 0.5 05/06/2020   ALKPHOS 72 05/06/2020   AST 22 05/06/2020   ALT 17 05/06/2020   PROT 6.7 05/06/2020   ALBUMIN 4.1 05/06/2020   CALCIUM 9.0 05/06/2020   Lab Results  Component Value Date   CHOL 143 05/06/2020   Lab Results  Component Value Date   HDL 43 05/06/2020   Lab Results  Component Value Date   LDLCALC 82 05/06/2020   Lab Results  Component Value Date   TRIG 99 05/06/2020   Lab Results  Component Value Date   CHOLHDL 3.3 05/06/2020   Lab Results  Component Value Date   HGBA1C 5.2 09/27/2020      Assessment & Plan:  Diagnoses and all orders for this visit: Chronic atrial fibrillation [facility]) Patient has a diagnosis of permanent atrial fibrillation.   Patient is on coumadin and has controlled ventricular response.  Patient is CV stable .  Diabetes mellitus type 2 with peripheral artery disease ([place]) An individual care plan for diabetes was established and reinforced today.  The patient's status was assessed using clinical findings on exam, labs and diagnostic testing. Patient success at meeting goals based on disease specific evidence-based guidelines and found to be good  controlled. Medications were assessed and patient's understanding of the medical issues , including barriers were assessed. Recommend adherence to a diabetic diet, a graduated exercise program, HgbA1c level is checked quarterly, and urine microalbumin performed yearly .  Annual mono-filament  sensation testing performed. Lower blood pressure and control hyperlipidemia is important. Get annual eye exams and annual flu shots and smoking cessation discussed.  Self management goals were discussed.   HFrEF (heart failure with reduced ejection fraction) ([place]) An individualized care plan was established and reinforced.  The patient's disease status was assessed using clinical finding son exam today, labs, and/or other diagnostic testing such as x-rays, to determine the patient's success in meeting treatmentgoalsbased on disease-based guidelines and found to beimproving. But not at goal yet. Medications prescriptions we adjusted medicines per Cardiology Laboratory tests ordered to be performed today include routine. RECOMMENDATIONS: given include see cardiology.  Call physician is patient gains 3 lbs in one day or 5 lbs for one week.  Call for progressive PND, orthopnea or increased pedal edema.   Mixed hyperlipidemia -     Lipid panel -     atorvastatin (LIPITOR) 40 MG tablet; Take 1 tablet (40 mg total) by mouth daily. AN INDIVIDUAL CARE PLAN for hyperlipidemia/ cholesterol was established and reinforced today.  The patient's status was assessed using clinical findings on exam, lab and other diagnostic tests. The patient's disease status was assessed based on evidence-based guidelines and found to be fair controlled. MEDICATIONS were reviewed. SELF MANAGEMENT GOALS have been discussed and patient's success at attaining the goal of low cholesterol was assessed. RECOMMENDATION given include regular exercise 3 days a week and low cholesterol/low fat diet. CLINICAL SUMMARY including written plan to identify barriers unique to the patient due to social or economic  reasons was discussed.   Essential hypertension -     CBC with Differential/Platelet -     Comprehensive metabolic panel An individual hypertension care plan was established and reinforced today.  The patient's status was assessed  using clinical findings on exam and labs or diagnostic tests. The patient's success at meeting treatment goals on disease specific evidence-based guidelines and found to be fair controlled. SELF MANAGEMENT: The patient and I together assessed ways to personally work towards obtaining the recommended goals. Restarted BP medicnes and watch for renal changes. RECOMMENDATIONS: avoid decongestants found in common cold remedies, decrease consumption of alcohol, perform routine monitoring of BP with home BP cuff, exercise, reduction of dietary salt, take medicines as prescribed, try not to miss doses and quit smoking.  Regular exercise and maintaining a healthy weight is needed.  Stress reduction may help. A CLINICAL SUMMARY including written plan identify barriers to care unique to individual due to social or financial issues.  We attempt to mutually creat solutions for individual and family understanding.   Chronic renal failure, unspecified CKD stage  Patient just stopped dialysis, I have no renal functions to compare, check today. Medicine list given to patient     Follow-up: Return in about 4 months (around 02/19/2021) for fasting.    Reinaldo Meeker, MD

## 2020-10-21 DIAGNOSIS — I2510 Atherosclerotic heart disease of native coronary artery without angina pectoris: Secondary | ICD-10-CM | POA: Diagnosis not present

## 2020-10-21 DIAGNOSIS — N179 Acute kidney failure, unspecified: Secondary | ICD-10-CM | POA: Diagnosis not present

## 2020-10-21 DIAGNOSIS — D509 Iron deficiency anemia, unspecified: Secondary | ICD-10-CM | POA: Diagnosis not present

## 2020-10-21 DIAGNOSIS — I5032 Chronic diastolic (congestive) heart failure: Secondary | ICD-10-CM | POA: Diagnosis not present

## 2020-10-21 DIAGNOSIS — Z9981 Dependence on supplemental oxygen: Secondary | ICD-10-CM | POA: Diagnosis not present

## 2020-10-21 DIAGNOSIS — E1151 Type 2 diabetes mellitus with diabetic peripheral angiopathy without gangrene: Secondary | ICD-10-CM | POA: Diagnosis not present

## 2020-10-21 DIAGNOSIS — Z992 Dependence on renal dialysis: Secondary | ICD-10-CM | POA: Diagnosis not present

## 2020-10-21 DIAGNOSIS — I482 Chronic atrial fibrillation, unspecified: Secondary | ICD-10-CM | POA: Diagnosis not present

## 2020-10-21 DIAGNOSIS — F5101 Primary insomnia: Secondary | ICD-10-CM | POA: Diagnosis not present

## 2020-10-21 DIAGNOSIS — J9611 Chronic respiratory failure with hypoxia: Secondary | ICD-10-CM | POA: Diagnosis not present

## 2020-10-21 DIAGNOSIS — E782 Mixed hyperlipidemia: Secondary | ICD-10-CM | POA: Diagnosis not present

## 2020-10-21 DIAGNOSIS — N186 End stage renal disease: Secondary | ICD-10-CM | POA: Diagnosis not present

## 2020-10-21 DIAGNOSIS — I82409 Acute embolism and thrombosis of unspecified deep veins of unspecified lower extremity: Secondary | ICD-10-CM | POA: Diagnosis not present

## 2020-10-21 DIAGNOSIS — I132 Hypertensive heart and chronic kidney disease with heart failure and with stage 5 chronic kidney disease, or end stage renal disease: Secondary | ICD-10-CM | POA: Diagnosis not present

## 2020-10-21 DIAGNOSIS — D6949 Other primary thrombocytopenia: Secondary | ICD-10-CM | POA: Diagnosis not present

## 2020-10-21 DIAGNOSIS — I429 Cardiomyopathy, unspecified: Secondary | ICD-10-CM | POA: Diagnosis not present

## 2020-10-21 DIAGNOSIS — E876 Hypokalemia: Secondary | ICD-10-CM | POA: Diagnosis not present

## 2020-10-21 DIAGNOSIS — E1122 Type 2 diabetes mellitus with diabetic chronic kidney disease: Secondary | ICD-10-CM | POA: Diagnosis not present

## 2020-10-21 DIAGNOSIS — Z87891 Personal history of nicotine dependence: Secondary | ICD-10-CM | POA: Diagnosis not present

## 2020-10-21 DIAGNOSIS — I452 Bifascicular block: Secondary | ICD-10-CM | POA: Diagnosis not present

## 2020-10-21 DIAGNOSIS — Z48815 Encounter for surgical aftercare following surgery on the digestive system: Secondary | ICD-10-CM | POA: Diagnosis not present

## 2020-10-21 LAB — CBC WITH DIFFERENTIAL/PLATELET
Basophils Absolute: 0 10*3/uL (ref 0.0–0.2)
Basos: 1 %
EOS (ABSOLUTE): 0.1 10*3/uL (ref 0.0–0.4)
Eos: 2 %
Hematocrit: 33.9 % — ABNORMAL LOW (ref 37.5–51.0)
Hemoglobin: 10.7 g/dL — ABNORMAL LOW (ref 13.0–17.7)
Immature Grans (Abs): 0 10*3/uL (ref 0.0–0.1)
Immature Granulocytes: 0 %
Lymphocytes Absolute: 1.8 10*3/uL (ref 0.7–3.1)
Lymphs: 29 %
MCH: 29.6 pg (ref 26.6–33.0)
MCHC: 31.6 g/dL (ref 31.5–35.7)
MCV: 94 fL (ref 79–97)
Monocytes Absolute: 0.5 10*3/uL (ref 0.1–0.9)
Monocytes: 7 %
Neutrophils Absolute: 3.8 10*3/uL (ref 1.4–7.0)
Neutrophils: 61 %
Platelets: 112 10*3/uL — ABNORMAL LOW (ref 150–450)
RBC: 3.62 x10E6/uL — ABNORMAL LOW (ref 4.14–5.80)
RDW: 17.2 % — ABNORMAL HIGH (ref 11.6–15.4)
WBC: 6.2 10*3/uL (ref 3.4–10.8)

## 2020-10-21 LAB — COMPREHENSIVE METABOLIC PANEL
ALT: 9 IU/L (ref 0–44)
AST: 12 IU/L (ref 0–40)
Albumin/Globulin Ratio: 1.2 (ref 1.2–2.2)
Albumin: 3.7 g/dL (ref 3.7–4.7)
Alkaline Phosphatase: 95 IU/L (ref 44–121)
BUN/Creatinine Ratio: 20 (ref 10–24)
BUN: 47 mg/dL — ABNORMAL HIGH (ref 8–27)
Bilirubin Total: 0.2 mg/dL (ref 0.0–1.2)
CO2: 16 mmol/L — ABNORMAL LOW (ref 20–29)
Calcium: 8.9 mg/dL (ref 8.6–10.2)
Chloride: 110 mmol/L — ABNORMAL HIGH (ref 96–106)
Creatinine, Ser: 2.34 mg/dL — ABNORMAL HIGH (ref 0.76–1.27)
Globulin, Total: 3.2 g/dL (ref 1.5–4.5)
Glucose: 97 mg/dL (ref 65–99)
Potassium: 4 mmol/L (ref 3.5–5.2)
Sodium: 144 mmol/L (ref 134–144)
Total Protein: 6.9 g/dL (ref 6.0–8.5)
eGFR: 28 mL/min/{1.73_m2} — ABNORMAL LOW (ref >59)

## 2020-10-21 LAB — LIPID PANEL
Chol/HDL Ratio: 4.3 ratio (ref 0.0–5.0)
Cholesterol, Total: 125 mg/dL (ref 100–199)
HDL: 29 mg/dL — ABNORMAL LOW (ref >39)
LDL Chol Calc (NIH): 70 mg/dL (ref 0–99)
Triglycerides: 150 mg/dL — ABNORMAL HIGH (ref 0–149)
VLDL Cholesterol Cal: 26 mg/dL (ref 5–40)

## 2020-10-21 LAB — CARDIOVASCULAR RISK ASSESSMENT

## 2020-10-21 NOTE — Progress Notes (Signed)
Anemia hemoglobin 10.7 platelets remain on low side, add ferritin and b12 level, kidneys stage 4 but stable, livr tests normal, triglycerides high 150,  lp

## 2020-10-22 ENCOUNTER — Other Ambulatory Visit: Payer: Self-pay

## 2020-10-22 DIAGNOSIS — E538 Deficiency of other specified B group vitamins: Secondary | ICD-10-CM

## 2020-10-25 DIAGNOSIS — N179 Acute kidney failure, unspecified: Secondary | ICD-10-CM | POA: Diagnosis not present

## 2020-10-25 DIAGNOSIS — I82409 Acute embolism and thrombosis of unspecified deep veins of unspecified lower extremity: Secondary | ICD-10-CM | POA: Diagnosis not present

## 2020-10-25 DIAGNOSIS — N186 End stage renal disease: Secondary | ICD-10-CM | POA: Diagnosis not present

## 2020-10-25 DIAGNOSIS — D509 Iron deficiency anemia, unspecified: Secondary | ICD-10-CM | POA: Diagnosis not present

## 2020-10-25 DIAGNOSIS — I429 Cardiomyopathy, unspecified: Secondary | ICD-10-CM | POA: Diagnosis not present

## 2020-10-25 DIAGNOSIS — D6949 Other primary thrombocytopenia: Secondary | ICD-10-CM | POA: Diagnosis not present

## 2020-10-25 DIAGNOSIS — Z992 Dependence on renal dialysis: Secondary | ICD-10-CM | POA: Diagnosis not present

## 2020-10-25 DIAGNOSIS — I5032 Chronic diastolic (congestive) heart failure: Secondary | ICD-10-CM | POA: Diagnosis not present

## 2020-10-25 DIAGNOSIS — E1151 Type 2 diabetes mellitus with diabetic peripheral angiopathy without gangrene: Secondary | ICD-10-CM | POA: Diagnosis not present

## 2020-10-25 DIAGNOSIS — Z87891 Personal history of nicotine dependence: Secondary | ICD-10-CM | POA: Diagnosis not present

## 2020-10-25 DIAGNOSIS — I482 Chronic atrial fibrillation, unspecified: Secondary | ICD-10-CM | POA: Diagnosis not present

## 2020-10-25 DIAGNOSIS — E1122 Type 2 diabetes mellitus with diabetic chronic kidney disease: Secondary | ICD-10-CM | POA: Diagnosis not present

## 2020-10-25 DIAGNOSIS — I2510 Atherosclerotic heart disease of native coronary artery without angina pectoris: Secondary | ICD-10-CM | POA: Diagnosis not present

## 2020-10-25 DIAGNOSIS — I132 Hypertensive heart and chronic kidney disease with heart failure and with stage 5 chronic kidney disease, or end stage renal disease: Secondary | ICD-10-CM | POA: Diagnosis not present

## 2020-10-25 DIAGNOSIS — Z48815 Encounter for surgical aftercare following surgery on the digestive system: Secondary | ICD-10-CM | POA: Diagnosis not present

## 2020-10-25 DIAGNOSIS — J9611 Chronic respiratory failure with hypoxia: Secondary | ICD-10-CM | POA: Diagnosis not present

## 2020-10-25 DIAGNOSIS — F5101 Primary insomnia: Secondary | ICD-10-CM | POA: Diagnosis not present

## 2020-10-25 DIAGNOSIS — Z9981 Dependence on supplemental oxygen: Secondary | ICD-10-CM | POA: Diagnosis not present

## 2020-10-25 DIAGNOSIS — E782 Mixed hyperlipidemia: Secondary | ICD-10-CM | POA: Diagnosis not present

## 2020-10-25 DIAGNOSIS — I452 Bifascicular block: Secondary | ICD-10-CM | POA: Diagnosis not present

## 2020-10-25 DIAGNOSIS — E876 Hypokalemia: Secondary | ICD-10-CM | POA: Diagnosis not present

## 2020-10-26 DIAGNOSIS — Z48815 Encounter for surgical aftercare following surgery on the digestive system: Secondary | ICD-10-CM | POA: Diagnosis not present

## 2020-10-26 DIAGNOSIS — I5032 Chronic diastolic (congestive) heart failure: Secondary | ICD-10-CM | POA: Diagnosis not present

## 2020-10-26 DIAGNOSIS — J9611 Chronic respiratory failure with hypoxia: Secondary | ICD-10-CM | POA: Diagnosis not present

## 2020-10-26 DIAGNOSIS — E782 Mixed hyperlipidemia: Secondary | ICD-10-CM | POA: Diagnosis not present

## 2020-10-26 DIAGNOSIS — N179 Acute kidney failure, unspecified: Secondary | ICD-10-CM | POA: Diagnosis not present

## 2020-10-26 DIAGNOSIS — I82409 Acute embolism and thrombosis of unspecified deep veins of unspecified lower extremity: Secondary | ICD-10-CM | POA: Diagnosis not present

## 2020-10-26 DIAGNOSIS — Z9981 Dependence on supplemental oxygen: Secondary | ICD-10-CM | POA: Diagnosis not present

## 2020-10-26 DIAGNOSIS — I452 Bifascicular block: Secondary | ICD-10-CM | POA: Diagnosis not present

## 2020-10-26 DIAGNOSIS — I2510 Atherosclerotic heart disease of native coronary artery without angina pectoris: Secondary | ICD-10-CM | POA: Diagnosis not present

## 2020-10-26 DIAGNOSIS — F5101 Primary insomnia: Secondary | ICD-10-CM | POA: Diagnosis not present

## 2020-10-26 DIAGNOSIS — I429 Cardiomyopathy, unspecified: Secondary | ICD-10-CM | POA: Diagnosis not present

## 2020-10-26 DIAGNOSIS — I132 Hypertensive heart and chronic kidney disease with heart failure and with stage 5 chronic kidney disease, or end stage renal disease: Secondary | ICD-10-CM | POA: Diagnosis not present

## 2020-10-26 DIAGNOSIS — I482 Chronic atrial fibrillation, unspecified: Secondary | ICD-10-CM | POA: Diagnosis not present

## 2020-10-26 DIAGNOSIS — Z992 Dependence on renal dialysis: Secondary | ICD-10-CM | POA: Diagnosis not present

## 2020-10-26 DIAGNOSIS — E876 Hypokalemia: Secondary | ICD-10-CM | POA: Diagnosis not present

## 2020-10-26 DIAGNOSIS — Z87891 Personal history of nicotine dependence: Secondary | ICD-10-CM | POA: Diagnosis not present

## 2020-10-26 DIAGNOSIS — N186 End stage renal disease: Secondary | ICD-10-CM | POA: Diagnosis not present

## 2020-10-26 DIAGNOSIS — D6949 Other primary thrombocytopenia: Secondary | ICD-10-CM | POA: Diagnosis not present

## 2020-10-26 DIAGNOSIS — D509 Iron deficiency anemia, unspecified: Secondary | ICD-10-CM | POA: Diagnosis not present

## 2020-10-26 DIAGNOSIS — E1151 Type 2 diabetes mellitus with diabetic peripheral angiopathy without gangrene: Secondary | ICD-10-CM | POA: Diagnosis not present

## 2020-10-26 DIAGNOSIS — E1122 Type 2 diabetes mellitus with diabetic chronic kidney disease: Secondary | ICD-10-CM | POA: Diagnosis not present

## 2020-10-26 LAB — FERRITIN: Ferritin: 267 ng/mL (ref 30–400)

## 2020-10-26 LAB — SPECIMEN STATUS REPORT

## 2020-10-26 LAB — VITAMIN B12: Vitamin B-12: 325 pg/mL (ref 232–1245)

## 2020-10-26 NOTE — Progress Notes (Signed)
Ferritin and B12 normal, no iron deficiency or B12 deficiency lp

## 2020-11-01 DIAGNOSIS — R5381 Other malaise: Secondary | ICD-10-CM | POA: Diagnosis not present

## 2020-11-01 DIAGNOSIS — I129 Hypertensive chronic kidney disease with stage 1 through stage 4 chronic kidney disease, or unspecified chronic kidney disease: Secondary | ICD-10-CM | POA: Diagnosis not present

## 2020-11-01 DIAGNOSIS — D649 Anemia, unspecified: Secondary | ICD-10-CM | POA: Diagnosis not present

## 2020-11-01 DIAGNOSIS — I4819 Other persistent atrial fibrillation: Secondary | ICD-10-CM | POA: Diagnosis not present

## 2020-11-01 DIAGNOSIS — N184 Chronic kidney disease, stage 4 (severe): Secondary | ICD-10-CM | POA: Diagnosis not present

## 2020-11-01 DIAGNOSIS — R5383 Other fatigue: Secondary | ICD-10-CM | POA: Diagnosis not present

## 2020-11-01 DIAGNOSIS — E1122 Type 2 diabetes mellitus with diabetic chronic kidney disease: Secondary | ICD-10-CM | POA: Diagnosis not present

## 2020-11-01 DIAGNOSIS — E872 Acidosis: Secondary | ICD-10-CM | POA: Diagnosis not present

## 2020-11-01 DIAGNOSIS — Z79899 Other long term (current) drug therapy: Secondary | ICD-10-CM | POA: Diagnosis not present

## 2020-11-01 DIAGNOSIS — I480 Paroxysmal atrial fibrillation: Secondary | ICD-10-CM | POA: Diagnosis not present

## 2020-11-01 DIAGNOSIS — R799 Abnormal finding of blood chemistry, unspecified: Secondary | ICD-10-CM | POA: Diagnosis not present

## 2020-11-02 DIAGNOSIS — E1122 Type 2 diabetes mellitus with diabetic chronic kidney disease: Secondary | ICD-10-CM | POA: Diagnosis not present

## 2020-11-02 DIAGNOSIS — D6949 Other primary thrombocytopenia: Secondary | ICD-10-CM | POA: Diagnosis not present

## 2020-11-02 DIAGNOSIS — I2510 Atherosclerotic heart disease of native coronary artery without angina pectoris: Secondary | ICD-10-CM | POA: Diagnosis not present

## 2020-11-02 DIAGNOSIS — I82409 Acute embolism and thrombosis of unspecified deep veins of unspecified lower extremity: Secondary | ICD-10-CM | POA: Diagnosis not present

## 2020-11-02 DIAGNOSIS — Z9981 Dependence on supplemental oxygen: Secondary | ICD-10-CM | POA: Diagnosis not present

## 2020-11-02 DIAGNOSIS — F5101 Primary insomnia: Secondary | ICD-10-CM | POA: Diagnosis not present

## 2020-11-02 DIAGNOSIS — Z992 Dependence on renal dialysis: Secondary | ICD-10-CM | POA: Diagnosis not present

## 2020-11-02 DIAGNOSIS — Z87891 Personal history of nicotine dependence: Secondary | ICD-10-CM | POA: Diagnosis not present

## 2020-11-02 DIAGNOSIS — E782 Mixed hyperlipidemia: Secondary | ICD-10-CM | POA: Diagnosis not present

## 2020-11-02 DIAGNOSIS — I132 Hypertensive heart and chronic kidney disease with heart failure and with stage 5 chronic kidney disease, or end stage renal disease: Secondary | ICD-10-CM | POA: Diagnosis not present

## 2020-11-02 DIAGNOSIS — I429 Cardiomyopathy, unspecified: Secondary | ICD-10-CM | POA: Diagnosis not present

## 2020-11-02 DIAGNOSIS — N179 Acute kidney failure, unspecified: Secondary | ICD-10-CM | POA: Diagnosis not present

## 2020-11-02 DIAGNOSIS — E876 Hypokalemia: Secondary | ICD-10-CM | POA: Diagnosis not present

## 2020-11-02 DIAGNOSIS — Z48815 Encounter for surgical aftercare following surgery on the digestive system: Secondary | ICD-10-CM | POA: Diagnosis not present

## 2020-11-02 DIAGNOSIS — E1151 Type 2 diabetes mellitus with diabetic peripheral angiopathy without gangrene: Secondary | ICD-10-CM | POA: Diagnosis not present

## 2020-11-02 DIAGNOSIS — I5032 Chronic diastolic (congestive) heart failure: Secondary | ICD-10-CM | POA: Diagnosis not present

## 2020-11-02 DIAGNOSIS — I452 Bifascicular block: Secondary | ICD-10-CM | POA: Diagnosis not present

## 2020-11-02 DIAGNOSIS — I482 Chronic atrial fibrillation, unspecified: Secondary | ICD-10-CM | POA: Diagnosis not present

## 2020-11-02 DIAGNOSIS — J9611 Chronic respiratory failure with hypoxia: Secondary | ICD-10-CM | POA: Diagnosis not present

## 2020-11-02 DIAGNOSIS — N186 End stage renal disease: Secondary | ICD-10-CM | POA: Diagnosis not present

## 2020-11-02 DIAGNOSIS — D509 Iron deficiency anemia, unspecified: Secondary | ICD-10-CM | POA: Diagnosis not present

## 2020-11-03 DIAGNOSIS — I493 Ventricular premature depolarization: Secondary | ICD-10-CM | POA: Diagnosis not present

## 2020-11-03 DIAGNOSIS — F5101 Primary insomnia: Secondary | ICD-10-CM | POA: Diagnosis not present

## 2020-11-03 DIAGNOSIS — E876 Hypokalemia: Secondary | ICD-10-CM | POA: Diagnosis not present

## 2020-11-03 DIAGNOSIS — J9611 Chronic respiratory failure with hypoxia: Secondary | ICD-10-CM | POA: Diagnosis not present

## 2020-11-03 DIAGNOSIS — Z955 Presence of coronary angioplasty implant and graft: Secondary | ICD-10-CM | POA: Diagnosis not present

## 2020-11-03 DIAGNOSIS — I5032 Chronic diastolic (congestive) heart failure: Secondary | ICD-10-CM | POA: Diagnosis not present

## 2020-11-03 DIAGNOSIS — H26492 Other secondary cataract, left eye: Secondary | ICD-10-CM | POA: Diagnosis not present

## 2020-11-03 DIAGNOSIS — E785 Hyperlipidemia, unspecified: Secondary | ICD-10-CM | POA: Diagnosis not present

## 2020-11-03 DIAGNOSIS — E1122 Type 2 diabetes mellitus with diabetic chronic kidney disease: Secondary | ICD-10-CM | POA: Diagnosis not present

## 2020-11-03 DIAGNOSIS — D509 Iron deficiency anemia, unspecified: Secondary | ICD-10-CM | POA: Diagnosis not present

## 2020-11-03 DIAGNOSIS — I452 Bifascicular block: Secondary | ICD-10-CM | POA: Diagnosis not present

## 2020-11-03 DIAGNOSIS — Z48815 Encounter for surgical aftercare following surgery on the digestive system: Secondary | ICD-10-CM | POA: Diagnosis not present

## 2020-11-03 DIAGNOSIS — N179 Acute kidney failure, unspecified: Secondary | ICD-10-CM | POA: Diagnosis not present

## 2020-11-03 DIAGNOSIS — I429 Cardiomyopathy, unspecified: Secondary | ICD-10-CM | POA: Diagnosis not present

## 2020-11-03 DIAGNOSIS — E1151 Type 2 diabetes mellitus with diabetic peripheral angiopathy without gangrene: Secondary | ICD-10-CM | POA: Diagnosis not present

## 2020-11-03 DIAGNOSIS — N186 End stage renal disease: Secondary | ICD-10-CM | POA: Diagnosis not present

## 2020-11-03 DIAGNOSIS — I4819 Other persistent atrial fibrillation: Secondary | ICD-10-CM | POA: Diagnosis not present

## 2020-11-03 DIAGNOSIS — Z9981 Dependence on supplemental oxygen: Secondary | ICD-10-CM | POA: Diagnosis not present

## 2020-11-03 DIAGNOSIS — I82409 Acute embolism and thrombosis of unspecified deep veins of unspecified lower extremity: Secondary | ICD-10-CM | POA: Diagnosis not present

## 2020-11-03 DIAGNOSIS — Z45018 Encounter for adjustment and management of other part of cardiac pacemaker: Secondary | ICD-10-CM | POA: Diagnosis not present

## 2020-11-03 DIAGNOSIS — I2510 Atherosclerotic heart disease of native coronary artery without angina pectoris: Secondary | ICD-10-CM | POA: Diagnosis not present

## 2020-11-03 DIAGNOSIS — Z992 Dependence on renal dialysis: Secondary | ICD-10-CM | POA: Diagnosis not present

## 2020-11-03 DIAGNOSIS — I482 Chronic atrial fibrillation, unspecified: Secondary | ICD-10-CM | POA: Diagnosis not present

## 2020-11-03 DIAGNOSIS — I480 Paroxysmal atrial fibrillation: Secondary | ICD-10-CM | POA: Diagnosis not present

## 2020-11-03 DIAGNOSIS — I132 Hypertensive heart and chronic kidney disease with heart failure and with stage 5 chronic kidney disease, or end stage renal disease: Secondary | ICD-10-CM | POA: Diagnosis not present

## 2020-11-03 DIAGNOSIS — Z7901 Long term (current) use of anticoagulants: Secondary | ICD-10-CM | POA: Diagnosis not present

## 2020-11-03 DIAGNOSIS — D6949 Other primary thrombocytopenia: Secondary | ICD-10-CM | POA: Diagnosis not present

## 2020-11-03 DIAGNOSIS — E119 Type 2 diabetes mellitus without complications: Secondary | ICD-10-CM | POA: Diagnosis not present

## 2020-11-03 DIAGNOSIS — I252 Old myocardial infarction: Secondary | ICD-10-CM | POA: Diagnosis not present

## 2020-11-03 DIAGNOSIS — I129 Hypertensive chronic kidney disease with stage 1 through stage 4 chronic kidney disease, or unspecified chronic kidney disease: Secondary | ICD-10-CM | POA: Diagnosis not present

## 2020-11-03 DIAGNOSIS — Z950 Presence of cardiac pacemaker: Secondary | ICD-10-CM | POA: Diagnosis not present

## 2020-11-03 DIAGNOSIS — E782 Mixed hyperlipidemia: Secondary | ICD-10-CM | POA: Diagnosis not present

## 2020-11-03 DIAGNOSIS — N183 Chronic kidney disease, stage 3 unspecified: Secondary | ICD-10-CM | POA: Diagnosis not present

## 2020-11-03 DIAGNOSIS — Z87891 Personal history of nicotine dependence: Secondary | ICD-10-CM | POA: Diagnosis not present

## 2020-11-04 DIAGNOSIS — J9611 Chronic respiratory failure with hypoxia: Secondary | ICD-10-CM | POA: Diagnosis not present

## 2020-11-10 DIAGNOSIS — N179 Acute kidney failure, unspecified: Secondary | ICD-10-CM | POA: Diagnosis not present

## 2020-11-10 DIAGNOSIS — Z48815 Encounter for surgical aftercare following surgery on the digestive system: Secondary | ICD-10-CM | POA: Diagnosis not present

## 2020-11-10 DIAGNOSIS — D6949 Other primary thrombocytopenia: Secondary | ICD-10-CM | POA: Diagnosis not present

## 2020-11-10 DIAGNOSIS — D509 Iron deficiency anemia, unspecified: Secondary | ICD-10-CM | POA: Diagnosis not present

## 2020-11-10 DIAGNOSIS — I5032 Chronic diastolic (congestive) heart failure: Secondary | ICD-10-CM | POA: Diagnosis not present

## 2020-11-10 DIAGNOSIS — I452 Bifascicular block: Secondary | ICD-10-CM | POA: Diagnosis not present

## 2020-11-10 DIAGNOSIS — I5021 Acute systolic (congestive) heart failure: Secondary | ICD-10-CM | POA: Diagnosis not present

## 2020-11-10 DIAGNOSIS — Z87891 Personal history of nicotine dependence: Secondary | ICD-10-CM | POA: Diagnosis not present

## 2020-11-10 DIAGNOSIS — E1151 Type 2 diabetes mellitus with diabetic peripheral angiopathy without gangrene: Secondary | ICD-10-CM | POA: Diagnosis not present

## 2020-11-10 DIAGNOSIS — Z992 Dependence on renal dialysis: Secondary | ICD-10-CM | POA: Diagnosis not present

## 2020-11-10 DIAGNOSIS — F5101 Primary insomnia: Secondary | ICD-10-CM | POA: Diagnosis not present

## 2020-11-10 DIAGNOSIS — I2510 Atherosclerotic heart disease of native coronary artery without angina pectoris: Secondary | ICD-10-CM | POA: Diagnosis not present

## 2020-11-10 DIAGNOSIS — E876 Hypokalemia: Secondary | ICD-10-CM | POA: Diagnosis not present

## 2020-11-10 DIAGNOSIS — E782 Mixed hyperlipidemia: Secondary | ICD-10-CM | POA: Diagnosis not present

## 2020-11-10 DIAGNOSIS — E1122 Type 2 diabetes mellitus with diabetic chronic kidney disease: Secondary | ICD-10-CM | POA: Diagnosis not present

## 2020-11-10 DIAGNOSIS — I429 Cardiomyopathy, unspecified: Secondary | ICD-10-CM | POA: Diagnosis not present

## 2020-11-10 DIAGNOSIS — N186 End stage renal disease: Secondary | ICD-10-CM | POA: Diagnosis not present

## 2020-11-10 DIAGNOSIS — I132 Hypertensive heart and chronic kidney disease with heart failure and with stage 5 chronic kidney disease, or end stage renal disease: Secondary | ICD-10-CM | POA: Diagnosis not present

## 2020-11-10 DIAGNOSIS — I82409 Acute embolism and thrombosis of unspecified deep veins of unspecified lower extremity: Secondary | ICD-10-CM | POA: Diagnosis not present

## 2020-11-10 DIAGNOSIS — J9611 Chronic respiratory failure with hypoxia: Secondary | ICD-10-CM | POA: Diagnosis not present

## 2020-11-10 DIAGNOSIS — Z9981 Dependence on supplemental oxygen: Secondary | ICD-10-CM | POA: Diagnosis not present

## 2020-11-10 DIAGNOSIS — I340 Nonrheumatic mitral (valve) insufficiency: Secondary | ICD-10-CM | POA: Diagnosis not present

## 2020-11-10 DIAGNOSIS — I482 Chronic atrial fibrillation, unspecified: Secondary | ICD-10-CM | POA: Diagnosis not present

## 2020-11-11 DIAGNOSIS — I480 Paroxysmal atrial fibrillation: Secondary | ICD-10-CM | POA: Diagnosis not present

## 2020-11-16 DIAGNOSIS — N186 End stage renal disease: Secondary | ICD-10-CM | POA: Diagnosis not present

## 2020-11-16 DIAGNOSIS — N179 Acute kidney failure, unspecified: Secondary | ICD-10-CM | POA: Diagnosis not present

## 2020-11-16 DIAGNOSIS — M160 Bilateral primary osteoarthritis of hip: Secondary | ICD-10-CM | POA: Diagnosis not present

## 2020-11-16 DIAGNOSIS — I480 Paroxysmal atrial fibrillation: Secondary | ICD-10-CM | POA: Diagnosis not present

## 2020-11-16 DIAGNOSIS — I129 Hypertensive chronic kidney disease with stage 1 through stage 4 chronic kidney disease, or unspecified chronic kidney disease: Secondary | ICD-10-CM | POA: Diagnosis not present

## 2020-11-16 DIAGNOSIS — I120 Hypertensive chronic kidney disease with stage 5 chronic kidney disease or end stage renal disease: Secondary | ICD-10-CM | POA: Diagnosis not present

## 2020-11-16 DIAGNOSIS — E8779 Other fluid overload: Secondary | ICD-10-CM | POA: Diagnosis not present

## 2020-11-16 DIAGNOSIS — I21A1 Myocardial infarction type 2: Secondary | ICD-10-CM | POA: Diagnosis not present

## 2020-11-16 DIAGNOSIS — I2510 Atherosclerotic heart disease of native coronary artery without angina pectoris: Secondary | ICD-10-CM | POA: Diagnosis not present

## 2020-11-16 DIAGNOSIS — R0609 Other forms of dyspnea: Secondary | ICD-10-CM | POA: Diagnosis not present

## 2020-11-16 DIAGNOSIS — I25110 Atherosclerotic heart disease of native coronary artery with unstable angina pectoris: Secondary | ICD-10-CM | POA: Diagnosis not present

## 2020-11-16 DIAGNOSIS — I4891 Unspecified atrial fibrillation: Secondary | ICD-10-CM | POA: Diagnosis not present

## 2020-11-16 DIAGNOSIS — Z87891 Personal history of nicotine dependence: Secondary | ICD-10-CM | POA: Diagnosis not present

## 2020-11-16 DIAGNOSIS — I4510 Unspecified right bundle-branch block: Secondary | ICD-10-CM | POA: Diagnosis not present

## 2020-11-16 DIAGNOSIS — Z7901 Long term (current) use of anticoagulants: Secondary | ICD-10-CM | POA: Diagnosis not present

## 2020-11-16 DIAGNOSIS — D631 Anemia in chronic kidney disease: Secondary | ICD-10-CM | POA: Diagnosis not present

## 2020-11-16 DIAGNOSIS — Z79899 Other long term (current) drug therapy: Secondary | ICD-10-CM | POA: Diagnosis not present

## 2020-11-16 DIAGNOSIS — Z833 Family history of diabetes mellitus: Secondary | ICD-10-CM | POA: Diagnosis not present

## 2020-11-16 DIAGNOSIS — Z9981 Dependence on supplemental oxygen: Secondary | ICD-10-CM | POA: Diagnosis not present

## 2020-11-16 DIAGNOSIS — J9811 Atelectasis: Secondary | ICD-10-CM | POA: Diagnosis not present

## 2020-11-16 DIAGNOSIS — R7989 Other specified abnormal findings of blood chemistry: Secondary | ICD-10-CM | POA: Diagnosis not present

## 2020-11-16 DIAGNOSIS — E876 Hypokalemia: Secondary | ICD-10-CM | POA: Diagnosis not present

## 2020-11-16 DIAGNOSIS — Z8249 Family history of ischemic heart disease and other diseases of the circulatory system: Secondary | ICD-10-CM | POA: Diagnosis not present

## 2020-11-16 DIAGNOSIS — I130 Hypertensive heart and chronic kidney disease with heart failure and stage 1 through stage 4 chronic kidney disease, or unspecified chronic kidney disease: Secondary | ICD-10-CM | POA: Diagnosis not present

## 2020-11-16 DIAGNOSIS — Z809 Family history of malignant neoplasm, unspecified: Secondary | ICD-10-CM | POA: Diagnosis not present

## 2020-11-16 DIAGNOSIS — J90 Pleural effusion, not elsewhere classified: Secondary | ICD-10-CM | POA: Diagnosis not present

## 2020-11-16 DIAGNOSIS — Z955 Presence of coronary angioplasty implant and graft: Secondary | ICD-10-CM | POA: Diagnosis not present

## 2020-11-16 DIAGNOSIS — G4733 Obstructive sleep apnea (adult) (pediatric): Secondary | ICD-10-CM | POA: Diagnosis not present

## 2020-11-16 DIAGNOSIS — N184 Chronic kidney disease, stage 4 (severe): Secondary | ICD-10-CM | POA: Diagnosis not present

## 2020-11-16 DIAGNOSIS — I340 Nonrheumatic mitral (valve) insufficiency: Secondary | ICD-10-CM | POA: Diagnosis not present

## 2020-11-16 DIAGNOSIS — N183 Chronic kidney disease, stage 3 unspecified: Secondary | ICD-10-CM | POA: Diagnosis not present

## 2020-11-16 DIAGNOSIS — I5023 Acute on chronic systolic (congestive) heart failure: Secondary | ICD-10-CM | POA: Diagnosis not present

## 2020-11-16 DIAGNOSIS — I252 Old myocardial infarction: Secondary | ICD-10-CM | POA: Diagnosis not present

## 2020-11-16 DIAGNOSIS — R079 Chest pain, unspecified: Secondary | ICD-10-CM | POA: Diagnosis not present

## 2020-11-16 DIAGNOSIS — R0789 Other chest pain: Secondary | ICD-10-CM | POA: Diagnosis not present

## 2020-11-16 DIAGNOSIS — I4819 Other persistent atrial fibrillation: Secondary | ICD-10-CM | POA: Diagnosis not present

## 2020-11-16 DIAGNOSIS — E1122 Type 2 diabetes mellitus with diabetic chronic kidney disease: Secondary | ICD-10-CM | POA: Diagnosis not present

## 2020-11-16 DIAGNOSIS — I200 Unstable angina: Secondary | ICD-10-CM | POA: Diagnosis not present

## 2020-11-16 DIAGNOSIS — I214 Non-ST elevation (NSTEMI) myocardial infarction: Secondary | ICD-10-CM | POA: Diagnosis not present

## 2020-11-16 DIAGNOSIS — E785 Hyperlipidemia, unspecified: Secondary | ICD-10-CM | POA: Diagnosis not present

## 2020-11-16 DIAGNOSIS — D649 Anemia, unspecified: Secondary | ICD-10-CM | POA: Diagnosis not present

## 2020-11-16 DIAGNOSIS — Z950 Presence of cardiac pacemaker: Secondary | ICD-10-CM | POA: Diagnosis not present

## 2020-11-16 DIAGNOSIS — R0602 Shortness of breath: Secondary | ICD-10-CM | POA: Diagnosis not present

## 2020-11-16 DIAGNOSIS — Z841 Family history of disorders of kidney and ureter: Secondary | ICD-10-CM | POA: Diagnosis not present

## 2020-11-16 DIAGNOSIS — Z992 Dependence on renal dialysis: Secondary | ICD-10-CM | POA: Diagnosis not present

## 2020-11-16 DIAGNOSIS — I5020 Unspecified systolic (congestive) heart failure: Secondary | ICD-10-CM | POA: Diagnosis not present

## 2020-11-16 DIAGNOSIS — I509 Heart failure, unspecified: Secondary | ICD-10-CM | POA: Diagnosis not present

## 2020-11-19 DIAGNOSIS — Z7901 Long term (current) use of anticoagulants: Secondary | ICD-10-CM | POA: Insufficient documentation

## 2020-11-23 DIAGNOSIS — Z87891 Personal history of nicotine dependence: Secondary | ICD-10-CM | POA: Diagnosis not present

## 2020-11-23 DIAGNOSIS — I429 Cardiomyopathy, unspecified: Secondary | ICD-10-CM | POA: Diagnosis not present

## 2020-11-23 DIAGNOSIS — E876 Hypokalemia: Secondary | ICD-10-CM | POA: Diagnosis not present

## 2020-11-23 DIAGNOSIS — E782 Mixed hyperlipidemia: Secondary | ICD-10-CM | POA: Diagnosis not present

## 2020-11-23 DIAGNOSIS — Z48815 Encounter for surgical aftercare following surgery on the digestive system: Secondary | ICD-10-CM | POA: Diagnosis not present

## 2020-11-23 DIAGNOSIS — I82409 Acute embolism and thrombosis of unspecified deep veins of unspecified lower extremity: Secondary | ICD-10-CM | POA: Diagnosis not present

## 2020-11-23 DIAGNOSIS — N186 End stage renal disease: Secondary | ICD-10-CM | POA: Diagnosis not present

## 2020-11-23 DIAGNOSIS — D509 Iron deficiency anemia, unspecified: Secondary | ICD-10-CM | POA: Diagnosis not present

## 2020-11-23 DIAGNOSIS — I5032 Chronic diastolic (congestive) heart failure: Secondary | ICD-10-CM | POA: Diagnosis not present

## 2020-11-23 DIAGNOSIS — E1122 Type 2 diabetes mellitus with diabetic chronic kidney disease: Secondary | ICD-10-CM | POA: Diagnosis not present

## 2020-11-23 DIAGNOSIS — I2510 Atherosclerotic heart disease of native coronary artery without angina pectoris: Secondary | ICD-10-CM | POA: Diagnosis not present

## 2020-11-23 DIAGNOSIS — D6949 Other primary thrombocytopenia: Secondary | ICD-10-CM | POA: Diagnosis not present

## 2020-11-23 DIAGNOSIS — F5101 Primary insomnia: Secondary | ICD-10-CM | POA: Diagnosis not present

## 2020-11-23 DIAGNOSIS — J9611 Chronic respiratory failure with hypoxia: Secondary | ICD-10-CM | POA: Diagnosis not present

## 2020-11-23 DIAGNOSIS — E1151 Type 2 diabetes mellitus with diabetic peripheral angiopathy without gangrene: Secondary | ICD-10-CM | POA: Diagnosis not present

## 2020-11-23 DIAGNOSIS — I5021 Acute systolic (congestive) heart failure: Secondary | ICD-10-CM | POA: Diagnosis not present

## 2020-11-23 DIAGNOSIS — Z992 Dependence on renal dialysis: Secondary | ICD-10-CM | POA: Diagnosis not present

## 2020-11-23 DIAGNOSIS — I482 Chronic atrial fibrillation, unspecified: Secondary | ICD-10-CM | POA: Diagnosis not present

## 2020-11-23 DIAGNOSIS — I340 Nonrheumatic mitral (valve) insufficiency: Secondary | ICD-10-CM | POA: Diagnosis not present

## 2020-11-23 DIAGNOSIS — N179 Acute kidney failure, unspecified: Secondary | ICD-10-CM | POA: Diagnosis not present

## 2020-11-23 DIAGNOSIS — I132 Hypertensive heart and chronic kidney disease with heart failure and with stage 5 chronic kidney disease, or end stage renal disease: Secondary | ICD-10-CM | POA: Diagnosis not present

## 2020-11-23 DIAGNOSIS — Z9981 Dependence on supplemental oxygen: Secondary | ICD-10-CM | POA: Diagnosis not present

## 2020-11-23 DIAGNOSIS — I452 Bifascicular block: Secondary | ICD-10-CM | POA: Diagnosis not present

## 2020-11-23 DIAGNOSIS — G4733 Obstructive sleep apnea (adult) (pediatric): Secondary | ICD-10-CM | POA: Diagnosis not present

## 2020-11-24 ENCOUNTER — Telehealth: Payer: Self-pay

## 2020-11-24 NOTE — Telephone Encounter (Signed)
Richardson Landry w/ [facility] calling requesting verbal orders. Requesting to see pt once a week for five weeks. Gave verbal orders for schedule.   Royce Macadamia, Erin 11/24/20 2:20 PM

## 2020-11-30 ENCOUNTER — Other Ambulatory Visit: Payer: Self-pay

## 2020-11-30 ENCOUNTER — Ambulatory Visit (INDEPENDENT_AMBULATORY_CARE_PROVIDER_SITE_OTHER): Payer: Medicare Other

## 2020-11-30 DIAGNOSIS — Z23 Encounter for immunization: Secondary | ICD-10-CM | POA: Diagnosis not present

## 2020-11-30 DIAGNOSIS — I480 Paroxysmal atrial fibrillation: Secondary | ICD-10-CM | POA: Diagnosis not present

## 2020-11-30 DIAGNOSIS — R791 Abnormal coagulation profile: Secondary | ICD-10-CM | POA: Diagnosis not present

## 2020-11-30 NOTE — Progress Notes (Deleted)
   Covid-19 Vaccination Clinic  Name:  HAMMOND OBEIRNE    MRN: [id number] DOB: 02-09-1946  11/30/2020  Mr. Taniguchi was observed post Covid-19 immunization for 15 minutes without incident. He was provided with Vaccine Information Sheet and instruction to access the V-Safe system.   Mr. Lorenson was instructed to call 911 with any severe reactions post vaccine: Difficulty breathing  Swelling of face and throat  A fast heartbeat  A bad rash all over body  Dizziness and weakness    *** Covid vaccine administration is NOT RECORDED.  Must document administration and refresh note before signing ***

## 2020-12-01 DIAGNOSIS — I452 Bifascicular block: Secondary | ICD-10-CM | POA: Diagnosis not present

## 2020-12-01 DIAGNOSIS — F5101 Primary insomnia: Secondary | ICD-10-CM | POA: Diagnosis not present

## 2020-12-01 DIAGNOSIS — D6949 Other primary thrombocytopenia: Secondary | ICD-10-CM | POA: Diagnosis not present

## 2020-12-01 DIAGNOSIS — I5032 Chronic diastolic (congestive) heart failure: Secondary | ICD-10-CM | POA: Diagnosis not present

## 2020-12-01 DIAGNOSIS — E876 Hypokalemia: Secondary | ICD-10-CM | POA: Diagnosis not present

## 2020-12-01 DIAGNOSIS — Z48815 Encounter for surgical aftercare following surgery on the digestive system: Secondary | ICD-10-CM | POA: Diagnosis not present

## 2020-12-01 DIAGNOSIS — D509 Iron deficiency anemia, unspecified: Secondary | ICD-10-CM | POA: Diagnosis not present

## 2020-12-01 DIAGNOSIS — E1122 Type 2 diabetes mellitus with diabetic chronic kidney disease: Secondary | ICD-10-CM | POA: Diagnosis not present

## 2020-12-01 DIAGNOSIS — E1151 Type 2 diabetes mellitus with diabetic peripheral angiopathy without gangrene: Secondary | ICD-10-CM | POA: Diagnosis not present

## 2020-12-01 DIAGNOSIS — Z9981 Dependence on supplemental oxygen: Secondary | ICD-10-CM | POA: Diagnosis not present

## 2020-12-01 DIAGNOSIS — I340 Nonrheumatic mitral (valve) insufficiency: Secondary | ICD-10-CM | POA: Diagnosis not present

## 2020-12-01 DIAGNOSIS — Z87891 Personal history of nicotine dependence: Secondary | ICD-10-CM | POA: Diagnosis not present

## 2020-12-01 DIAGNOSIS — I132 Hypertensive heart and chronic kidney disease with heart failure and with stage 5 chronic kidney disease, or end stage renal disease: Secondary | ICD-10-CM | POA: Diagnosis not present

## 2020-12-01 DIAGNOSIS — I5021 Acute systolic (congestive) heart failure: Secondary | ICD-10-CM | POA: Diagnosis not present

## 2020-12-01 DIAGNOSIS — E782 Mixed hyperlipidemia: Secondary | ICD-10-CM | POA: Diagnosis not present

## 2020-12-01 DIAGNOSIS — Z992 Dependence on renal dialysis: Secondary | ICD-10-CM | POA: Diagnosis not present

## 2020-12-01 DIAGNOSIS — J9611 Chronic respiratory failure with hypoxia: Secondary | ICD-10-CM | POA: Diagnosis not present

## 2020-12-01 DIAGNOSIS — I482 Chronic atrial fibrillation, unspecified: Secondary | ICD-10-CM | POA: Diagnosis not present

## 2020-12-01 DIAGNOSIS — N179 Acute kidney failure, unspecified: Secondary | ICD-10-CM | POA: Diagnosis not present

## 2020-12-01 DIAGNOSIS — I429 Cardiomyopathy, unspecified: Secondary | ICD-10-CM | POA: Diagnosis not present

## 2020-12-01 DIAGNOSIS — N186 End stage renal disease: Secondary | ICD-10-CM | POA: Diagnosis not present

## 2020-12-01 DIAGNOSIS — I82409 Acute embolism and thrombosis of unspecified deep veins of unspecified lower extremity: Secondary | ICD-10-CM | POA: Diagnosis not present

## 2020-12-01 DIAGNOSIS — I2510 Atherosclerotic heart disease of native coronary artery without angina pectoris: Secondary | ICD-10-CM | POA: Diagnosis not present

## 2020-12-01 NOTE — Progress Notes (Signed)
   Covid-19 Vaccination Clinic  Name:  Steven Hester    MRN: [id number] DOB: [birth year]  12/01/2020  [name] was observed post Covid-19 immunization for 15 minutes without incident. He was provided with Vaccine Information Sheet and instruction to access the V-Safe system.   [name] was instructed to call 911 with any severe reactions post vaccine: Difficulty breathing  Swelling of face and throat  A fast heartbeat  A bad rash all over body  Dizziness and weakness   Immunizations Administered     Name Date Dose VIS Date Route   Moderna Covid-19 Booster Vaccine 11/30/2020  9:16 AM 0.25 mL 02/25/2020 Intramuscular   Manufacturer: Moderna   Lot: [id number]   [place]: [id number]

## 2020-12-02 DIAGNOSIS — Z950 Presence of cardiac pacemaker: Secondary | ICD-10-CM | POA: Diagnosis not present

## 2020-12-02 DIAGNOSIS — I340 Nonrheumatic mitral (valve) insufficiency: Secondary | ICD-10-CM | POA: Insufficient documentation

## 2020-12-02 DIAGNOSIS — I252 Old myocardial infarction: Secondary | ICD-10-CM | POA: Diagnosis not present

## 2020-12-02 DIAGNOSIS — I4819 Other persistent atrial fibrillation: Secondary | ICD-10-CM | POA: Diagnosis not present

## 2020-12-02 DIAGNOSIS — E785 Hyperlipidemia, unspecified: Secondary | ICD-10-CM | POA: Diagnosis not present

## 2020-12-02 DIAGNOSIS — I2510 Atherosclerotic heart disease of native coronary artery without angina pectoris: Secondary | ICD-10-CM | POA: Diagnosis not present

## 2020-12-02 DIAGNOSIS — Z7901 Long term (current) use of anticoagulants: Secondary | ICD-10-CM | POA: Diagnosis not present

## 2020-12-02 DIAGNOSIS — I110 Hypertensive heart disease with heart failure: Secondary | ICD-10-CM | POA: Diagnosis not present

## 2020-12-02 DIAGNOSIS — I5020 Unspecified systolic (congestive) heart failure: Secondary | ICD-10-CM | POA: Diagnosis not present

## 2020-12-03 DIAGNOSIS — J9611 Chronic respiratory failure with hypoxia: Secondary | ICD-10-CM | POA: Diagnosis not present

## 2020-12-05 DIAGNOSIS — J9611 Chronic respiratory failure with hypoxia: Secondary | ICD-10-CM | POA: Diagnosis not present

## 2020-12-07 DIAGNOSIS — E1122 Type 2 diabetes mellitus with diabetic chronic kidney disease: Secondary | ICD-10-CM | POA: Diagnosis not present

## 2020-12-07 DIAGNOSIS — I452 Bifascicular block: Secondary | ICD-10-CM | POA: Diagnosis not present

## 2020-12-07 DIAGNOSIS — Z992 Dependence on renal dialysis: Secondary | ICD-10-CM | POA: Diagnosis not present

## 2020-12-07 DIAGNOSIS — I5032 Chronic diastolic (congestive) heart failure: Secondary | ICD-10-CM | POA: Diagnosis not present

## 2020-12-07 DIAGNOSIS — D509 Iron deficiency anemia, unspecified: Secondary | ICD-10-CM | POA: Diagnosis not present

## 2020-12-07 DIAGNOSIS — J9611 Chronic respiratory failure with hypoxia: Secondary | ICD-10-CM | POA: Diagnosis not present

## 2020-12-07 DIAGNOSIS — I82409 Acute embolism and thrombosis of unspecified deep veins of unspecified lower extremity: Secondary | ICD-10-CM | POA: Diagnosis not present

## 2020-12-07 DIAGNOSIS — N179 Acute kidney failure, unspecified: Secondary | ICD-10-CM | POA: Diagnosis not present

## 2020-12-07 DIAGNOSIS — I5021 Acute systolic (congestive) heart failure: Secondary | ICD-10-CM | POA: Diagnosis not present

## 2020-12-07 DIAGNOSIS — N186 End stage renal disease: Secondary | ICD-10-CM | POA: Diagnosis not present

## 2020-12-07 DIAGNOSIS — Z7901 Long term (current) use of anticoagulants: Secondary | ICD-10-CM | POA: Diagnosis not present

## 2020-12-07 DIAGNOSIS — E782 Mixed hyperlipidemia: Secondary | ICD-10-CM | POA: Diagnosis not present

## 2020-12-07 DIAGNOSIS — E1151 Type 2 diabetes mellitus with diabetic peripheral angiopathy without gangrene: Secondary | ICD-10-CM | POA: Diagnosis not present

## 2020-12-07 DIAGNOSIS — I132 Hypertensive heart and chronic kidney disease with heart failure and with stage 5 chronic kidney disease, or end stage renal disease: Secondary | ICD-10-CM | POA: Diagnosis not present

## 2020-12-07 DIAGNOSIS — I482 Chronic atrial fibrillation, unspecified: Secondary | ICD-10-CM | POA: Diagnosis not present

## 2020-12-07 DIAGNOSIS — D6949 Other primary thrombocytopenia: Secondary | ICD-10-CM | POA: Diagnosis not present

## 2020-12-07 DIAGNOSIS — I2510 Atherosclerotic heart disease of native coronary artery without angina pectoris: Secondary | ICD-10-CM | POA: Diagnosis not present

## 2020-12-07 DIAGNOSIS — Z9981 Dependence on supplemental oxygen: Secondary | ICD-10-CM | POA: Diagnosis not present

## 2020-12-07 DIAGNOSIS — I429 Cardiomyopathy, unspecified: Secondary | ICD-10-CM | POA: Diagnosis not present

## 2020-12-07 DIAGNOSIS — F5101 Primary insomnia: Secondary | ICD-10-CM | POA: Diagnosis not present

## 2020-12-07 DIAGNOSIS — E876 Hypokalemia: Secondary | ICD-10-CM | POA: Diagnosis not present

## 2020-12-07 DIAGNOSIS — I340 Nonrheumatic mitral (valve) insufficiency: Secondary | ICD-10-CM | POA: Diagnosis not present

## 2020-12-07 DIAGNOSIS — Z87891 Personal history of nicotine dependence: Secondary | ICD-10-CM | POA: Diagnosis not present

## 2020-12-13 ENCOUNTER — Telehealth: Payer: Self-pay

## 2020-12-13 DIAGNOSIS — N179 Acute kidney failure, unspecified: Secondary | ICD-10-CM | POA: Diagnosis not present

## 2020-12-13 DIAGNOSIS — I5021 Acute systolic (congestive) heart failure: Secondary | ICD-10-CM | POA: Diagnosis not present

## 2020-12-13 DIAGNOSIS — Z992 Dependence on renal dialysis: Secondary | ICD-10-CM | POA: Diagnosis not present

## 2020-12-13 DIAGNOSIS — I340 Nonrheumatic mitral (valve) insufficiency: Secondary | ICD-10-CM | POA: Diagnosis not present

## 2020-12-13 DIAGNOSIS — E1151 Type 2 diabetes mellitus with diabetic peripheral angiopathy without gangrene: Secondary | ICD-10-CM | POA: Diagnosis not present

## 2020-12-13 DIAGNOSIS — F5101 Primary insomnia: Secondary | ICD-10-CM | POA: Diagnosis not present

## 2020-12-13 DIAGNOSIS — I429 Cardiomyopathy, unspecified: Secondary | ICD-10-CM | POA: Diagnosis not present

## 2020-12-13 DIAGNOSIS — D509 Iron deficiency anemia, unspecified: Secondary | ICD-10-CM | POA: Diagnosis not present

## 2020-12-13 DIAGNOSIS — J9611 Chronic respiratory failure with hypoxia: Secondary | ICD-10-CM | POA: Diagnosis not present

## 2020-12-13 DIAGNOSIS — D6949 Other primary thrombocytopenia: Secondary | ICD-10-CM | POA: Diagnosis not present

## 2020-12-13 DIAGNOSIS — I482 Chronic atrial fibrillation, unspecified: Secondary | ICD-10-CM | POA: Diagnosis not present

## 2020-12-13 DIAGNOSIS — I452 Bifascicular block: Secondary | ICD-10-CM | POA: Diagnosis not present

## 2020-12-13 DIAGNOSIS — I132 Hypertensive heart and chronic kidney disease with heart failure and with stage 5 chronic kidney disease, or end stage renal disease: Secondary | ICD-10-CM | POA: Diagnosis not present

## 2020-12-13 DIAGNOSIS — I2510 Atherosclerotic heart disease of native coronary artery without angina pectoris: Secondary | ICD-10-CM | POA: Diagnosis not present

## 2020-12-13 DIAGNOSIS — E1122 Type 2 diabetes mellitus with diabetic chronic kidney disease: Secondary | ICD-10-CM | POA: Diagnosis not present

## 2020-12-13 DIAGNOSIS — N186 End stage renal disease: Secondary | ICD-10-CM | POA: Diagnosis not present

## 2020-12-13 DIAGNOSIS — E782 Mixed hyperlipidemia: Secondary | ICD-10-CM | POA: Diagnosis not present

## 2020-12-13 DIAGNOSIS — I82409 Acute embolism and thrombosis of unspecified deep veins of unspecified lower extremity: Secondary | ICD-10-CM | POA: Diagnosis not present

## 2020-12-13 DIAGNOSIS — E876 Hypokalemia: Secondary | ICD-10-CM | POA: Diagnosis not present

## 2020-12-13 DIAGNOSIS — Z9981 Dependence on supplemental oxygen: Secondary | ICD-10-CM | POA: Diagnosis not present

## 2020-12-13 DIAGNOSIS — Z87891 Personal history of nicotine dependence: Secondary | ICD-10-CM | POA: Diagnosis not present

## 2020-12-13 DIAGNOSIS — Z7901 Long term (current) use of anticoagulants: Secondary | ICD-10-CM | POA: Diagnosis not present

## 2020-12-13 DIAGNOSIS — I5032 Chronic diastolic (congestive) heart failure: Secondary | ICD-10-CM | POA: Diagnosis not present

## 2020-12-13 NOTE — Telephone Encounter (Signed)
Steven Hester w/ [facility] calling to make pt appointment and request verbal orders. States pt called on call over weekend therefore nursing needs to go back out. Requesting once a week this week, then twice a week for two weeks. Gave verbal orders for this schedule. Made appointment for pt for tomorrow at 1:30.   Royce Macadamia, [place] 12/13/20 2:12 PM

## 2020-12-14 ENCOUNTER — Other Ambulatory Visit: Payer: Self-pay

## 2020-12-14 ENCOUNTER — Encounter: Payer: Self-pay | Admitting: Legal Medicine

## 2020-12-14 ENCOUNTER — Ambulatory Visit (INDEPENDENT_AMBULATORY_CARE_PROVIDER_SITE_OTHER): Payer: Medicare Other | Admitting: Legal Medicine

## 2020-12-14 VITALS — BP 122/70 | HR 79 | Temp 97.4°F | Ht 73.0 in | Wt 288.0 lb

## 2020-12-14 DIAGNOSIS — E1151 Type 2 diabetes mellitus with diabetic peripheral angiopathy without gangrene: Secondary | ICD-10-CM | POA: Diagnosis not present

## 2020-12-14 DIAGNOSIS — D6949 Other primary thrombocytopenia: Secondary | ICD-10-CM | POA: Diagnosis not present

## 2020-12-14 DIAGNOSIS — I340 Nonrheumatic mitral (valve) insufficiency: Secondary | ICD-10-CM | POA: Diagnosis not present

## 2020-12-14 DIAGNOSIS — I5020 Unspecified systolic (congestive) heart failure: Secondary | ICD-10-CM

## 2020-12-14 DIAGNOSIS — I5032 Chronic diastolic (congestive) heart failure: Secondary | ICD-10-CM | POA: Diagnosis not present

## 2020-12-14 DIAGNOSIS — I429 Cardiomyopathy, unspecified: Secondary | ICD-10-CM | POA: Diagnosis not present

## 2020-12-14 DIAGNOSIS — R0602 Shortness of breath: Secondary | ICD-10-CM | POA: Diagnosis not present

## 2020-12-14 DIAGNOSIS — E782 Mixed hyperlipidemia: Secondary | ICD-10-CM | POA: Diagnosis not present

## 2020-12-14 DIAGNOSIS — J90 Pleural effusion, not elsewhere classified: Secondary | ICD-10-CM | POA: Diagnosis not present

## 2020-12-14 DIAGNOSIS — I82409 Acute embolism and thrombosis of unspecified deep veins of unspecified lower extremity: Secondary | ICD-10-CM | POA: Diagnosis not present

## 2020-12-14 DIAGNOSIS — J9611 Chronic respiratory failure with hypoxia: Secondary | ICD-10-CM | POA: Diagnosis not present

## 2020-12-14 DIAGNOSIS — I5021 Acute systolic (congestive) heart failure: Secondary | ICD-10-CM | POA: Diagnosis not present

## 2020-12-14 DIAGNOSIS — Z9981 Dependence on supplemental oxygen: Secondary | ICD-10-CM | POA: Diagnosis not present

## 2020-12-14 DIAGNOSIS — Z87891 Personal history of nicotine dependence: Secondary | ICD-10-CM | POA: Diagnosis not present

## 2020-12-14 DIAGNOSIS — N179 Acute kidney failure, unspecified: Secondary | ICD-10-CM | POA: Diagnosis not present

## 2020-12-14 DIAGNOSIS — I482 Chronic atrial fibrillation, unspecified: Secondary | ICD-10-CM | POA: Diagnosis not present

## 2020-12-14 DIAGNOSIS — I2510 Atherosclerotic heart disease of native coronary artery without angina pectoris: Secondary | ICD-10-CM | POA: Diagnosis not present

## 2020-12-14 DIAGNOSIS — D509 Iron deficiency anemia, unspecified: Secondary | ICD-10-CM | POA: Diagnosis not present

## 2020-12-14 DIAGNOSIS — I132 Hypertensive heart and chronic kidney disease with heart failure and with stage 5 chronic kidney disease, or end stage renal disease: Secondary | ICD-10-CM | POA: Diagnosis not present

## 2020-12-14 DIAGNOSIS — Z992 Dependence on renal dialysis: Secondary | ICD-10-CM | POA: Diagnosis not present

## 2020-12-14 DIAGNOSIS — E876 Hypokalemia: Secondary | ICD-10-CM | POA: Diagnosis not present

## 2020-12-14 DIAGNOSIS — N186 End stage renal disease: Secondary | ICD-10-CM | POA: Diagnosis not present

## 2020-12-14 DIAGNOSIS — Z7901 Long term (current) use of anticoagulants: Secondary | ICD-10-CM | POA: Diagnosis not present

## 2020-12-14 DIAGNOSIS — E1122 Type 2 diabetes mellitus with diabetic chronic kidney disease: Secondary | ICD-10-CM | POA: Diagnosis not present

## 2020-12-14 DIAGNOSIS — J9811 Atelectasis: Secondary | ICD-10-CM | POA: Diagnosis not present

## 2020-12-14 DIAGNOSIS — F5101 Primary insomnia: Secondary | ICD-10-CM | POA: Diagnosis not present

## 2020-12-14 DIAGNOSIS — I452 Bifascicular block: Secondary | ICD-10-CM | POA: Diagnosis not present

## 2020-12-14 NOTE — Progress Notes (Signed)
Established Patient Office Visit  Subjective:  Patient ID: Steven Hester, male    DOB: 07-27-1945  Age: 75 y.o. MRN: [id number]  CC:  Chief Complaint  Patient presents with   Edema    HPI Steven Hester Steven Hester presents for he was admitted on 11/16/2020 for dyspnea, atrial fibrillation, pleural effusion, HFrEF of 35%,  He has pericardiocentesis, worsening MR, creatinine 2.26, pacemaker, he is to go to cardiology tomorrow. He started with increase dyspnea, he had pleural effusion in hospital. He is on lasix 90m bid, dr. CTobie Poetincreased it to 1257mover weekend, home nurse cut him back to 8063m He is using O2 PRN.  Past Medical History:  Diagnosis Date   Chronic atrial fibrillation (HCC)    Essential hypertension    HLD (hyperlipidemia)    Mixed hyperlipidemia    [place]   [facility]   Past Surgical History:  Procedure Laterality Date   CORONARY ANGIOPLASTY WITH STENT PLACEMENT      Family History  Problem Relation Age of Onset   Heart attack Mother    GI Disease Father    Cancer Father     Social History   Socioeconomic History   Marital status: Married    Spouse name: Not on file   Number of children: Not on file   Years of education: Not on file   Highest education level: Not on file  Occupational History   Not on file  Tobacco Use   Smoking status: Former    Packs/day: 1.00    Years: 10.00    Pack years: 10.00    Types: Cigarettes   Smokeless tobacco: Never  Substance and Sexual Activity   Alcohol use: Yes    Comment: occaionally    Drug use: Never   Sexual activity: Not on file  Other Topics Concern   Not on file  Social History Narrative   Not on file   Social Determinants of Health   Financial Resource Strain: Low Risk    Difficulty of Paying Living Expenses: Not hard at all  Food Insecurity: No Food Insecurity   Worried About RunCharity fundraisern the Last Year: Never true   Ran Out of Food in the Last Year: Never true  Transportation Needs: No Transportation Needs   Lack of Transportation (Medical): No   Lack of Transportation (Non-Medical): No  Physical Activity: Insufficiently Active   Days of Exercise per Week: 2 days   Minutes of Exercise per Session: 30 min  Stress: No Stress Concern Present   Feeling of Stress : Not at all  Social Connections: Moderately Isolated   Frequency of Communication with Friends and Family: Three times a week   Frequency of Social Gatherings with Friends and Family: Never   Attends Religious Services: Never   ActMarine scientist Organizations: No   Attends CluMusic therapistever   Marital Status: Married  IntHuman resources officerolence: Not At Risk   Fear of Current or Ex-Partner: No   Emotionally Abused: No   Physically Abused: No   Sexually Abused: No    Outpatient Medications Prior to Visit  Medication Sig Dispense Refill   amiodarone (PACERONE) 200 MG tablet Take 100 mg by mouth daily.     atorvastatin (LIPITOR) 40 MG tablet Take 1 tablet (40 mg total) by mouth daily. 90 tablet 1   carvedilol (COREG) 3.125 MG tablet  Take 3.125 mg by mouth 2 (two) times daily.     furosemide (LASIX) 40 MG tablet Take 1 tablet by mouth daily.     glucose blood test strip 1 each by Other route 2 (two) times daily. Use as instructed 100 each 3   nitroGLYCERIN (NITROSTAT) 0.4 MG SL tablet Place under the tongue.     oxyCODONE (OXY IR/ROXICODONE) 5 MG immediate release tablet Take by mouth.     warfarin (COUMADIN) 5 MG tablet Take 5 mg by mouth daily.     sodium bicarbonate 650 MG tablet Take 650 mg by mouth 2 (two) times daily.     No facility-administered medications prior to visit.    Allergies  Allergen Reactions   Ezetimibe Other (See Comments)    Zetia-unknown reaction    Neopap [Acetaminophen]    Simvastatin Other (See Comments)   Niacin Rash    Other reaction(s): Unknown     ROS Review of Systems  Constitutional:  Negative for chills, fatigue and fever.  HENT:  Negative for congestion, ear pain and sore throat.   Respiratory:  Negative for cough, chest tightness and shortness of breath.   Cardiovascular:  Negative for chest pain.  Gastrointestinal:  Negative for abdominal pain, constipation, diarrhea, nausea and vomiting.  Endocrine: Negative for polydipsia, polyphagia and polyuria.  Genitourinary:  Negative for dysuria and frequency.  Musculoskeletal:  Negative for arthralgias and myalgias.  Skin: Negative.   Neurological:  Negative for dizziness and headaches.  Psychiatric/Behavioral:  Negative for dysphoric mood.        No dysphoria     Objective:    Physical Exam Vitals reviewed.  Constitutional:      Appearance: Normal appearance.  HENT:     Right Ear: Tympanic membrane normal.     Left Ear: Tympanic membrane normal.     Nose: Nose normal.     Mouth/Throat:     Mouth: Mucous membranes are moist.     Pharynx: Oropharynx is clear.  Eyes:     Extraocular Movements: Extraocular movements intact.     Conjunctiva/sclera: Conjunctivae normal.     Pupils: Pupils are equal, round, and reactive to light.  Cardiovascular:     Rate and Rhythm: Normal rate. Rhythm irregular.     Heart sounds: Murmur (MR) heard.    No gallop (none).  Pulmonary:     Effort: Pulmonary effort is normal.     Breath sounds: Rales present.     Comments: Dullness to percussion right side with rales 1/2 way up. Abdominal:     General: Abdomen is flat. Bowel sounds are normal. There is no distension.     Palpations: Abdomen is soft.     Tenderness: There is no abdominal tenderness.  Musculoskeletal:     Right lower leg: No edema.     Left lower leg: No edema.  Skin:    Capillary Refill: Capillary refill takes less than 2 seconds.  Neurological:     General: No focal deficit present.     Mental Status: He is alert and oriented to person, place, and time. Mental  status is at baseline.    BP 122/70   Pulse 79   Temp (!) 97.4 F (36.3 C)   Ht 6' 1"  (1.854 m)   Wt 288 lb (130.6 kg)   SpO2 97%   BMI 38.00 kg/m  Wt Readings from Last 3 Encounters:  12/14/20 288 lb (130.6 kg)  10/20/20 285 lb (129.3 kg)  09/27/20 290 lb (131.5  kg)     Health Maintenance Due  Topic Date Due   OPHTHALMOLOGY EXAM  Never done   URINE MICROALBUMIN  Never done   Hepatitis C Screening  Never done   Zoster Vaccines- Shingrix (1 of 2) Never done   INFLUENZA VACCINE  12/06/2020    There are no preventive care reminders to display for this patient.  No results found for: TSH Lab Results  Component Value Date   WBC 6.2 10/20/2020   HGB 10.7 (L) 10/20/2020   HCT 33.9 (L) 10/20/2020   MCV 94 10/20/2020   PLT 112 (L) 10/20/2020   Lab Results  Component Value Date   NA 144 10/20/2020   K 4.0 10/20/2020   CO2 16 (L) 10/20/2020   GLUCOSE 97 10/20/2020   BUN 47 (H) 10/20/2020   CREATININE 2.34 (H) 10/20/2020   BILITOT 0.2 10/20/2020   ALKPHOS 95 10/20/2020   AST 12 10/20/2020   ALT 9 10/20/2020   PROT 6.9 10/20/2020   ALBUMIN 3.7 10/20/2020   CALCIUM 8.9 10/20/2020   EGFR 28 (L) 10/20/2020   Lab Results  Component Value Date   CHOL 125 10/20/2020   Lab Results  Component Value Date   HDL 29 (L) 10/20/2020   Lab Results  Component Value Date   LDLCALC 70 10/20/2020   Lab Results  Component Value Date   TRIG 150 (H) 10/20/2020   Lab Results  Component Value Date   CHOLHDL 4.3 10/20/2020   Lab Results  Component Value Date   HGBA1C 5.2 09/27/2020      Assessment & Plan:   Diagnoses and all orders for this visit: HFrEF (heart failure with reduced ejection fraction) (HCC) -     DG Chest 2 View -     Basic Metabolic Panel Patient has CHF and recently discharged  Pleural effusion  Dullness right lung suspect enlarging effusion     Follow-up: Return in about 1 week (around 12/21/2020).    Reinaldo Meeker, MD

## 2020-12-15 ENCOUNTER — Telehealth: Payer: Self-pay

## 2020-12-15 ENCOUNTER — Other Ambulatory Visit: Payer: Self-pay

## 2020-12-15 DIAGNOSIS — I4819 Other persistent atrial fibrillation: Secondary | ICD-10-CM | POA: Diagnosis not present

## 2020-12-15 LAB — BASIC METABOLIC PANEL
BUN/Creatinine Ratio: 17 (ref 10–24)
BUN: 42 mg/dL — ABNORMAL HIGH (ref 8–27)
CO2: 21 mmol/L (ref 20–29)
Calcium: 8.7 mg/dL (ref 8.6–10.2)
Chloride: 102 mmol/L (ref 96–106)
Creatinine, Ser: 2.46 mg/dL — ABNORMAL HIGH (ref 0.76–1.27)
Glucose: 139 mg/dL — ABNORMAL HIGH (ref 65–99)
Potassium: 3.6 mmol/L (ref 3.5–5.2)
Sodium: 143 mmol/L (ref 134–144)
eGFR: 26 mL/min/{1.73_m2} — ABNORMAL LOW (ref >59)

## 2020-12-15 NOTE — Telephone Encounter (Signed)
Patient was informed about the results of Chest Xray. Per Dr Henrene Pastor increased lasix 3 times per day. I called Dr Otho Perl office to let them know about the X ray results.

## 2020-12-15 NOTE — Progress Notes (Signed)
No changes in renal function, glucose 139 lp

## 2020-12-16 DIAGNOSIS — Z87891 Personal history of nicotine dependence: Secondary | ICD-10-CM | POA: Diagnosis not present

## 2020-12-16 DIAGNOSIS — I429 Cardiomyopathy, unspecified: Secondary | ICD-10-CM | POA: Diagnosis not present

## 2020-12-16 DIAGNOSIS — Z9981 Dependence on supplemental oxygen: Secondary | ICD-10-CM | POA: Diagnosis not present

## 2020-12-16 DIAGNOSIS — I452 Bifascicular block: Secondary | ICD-10-CM | POA: Diagnosis not present

## 2020-12-16 DIAGNOSIS — E782 Mixed hyperlipidemia: Secondary | ICD-10-CM | POA: Diagnosis not present

## 2020-12-16 DIAGNOSIS — I132 Hypertensive heart and chronic kidney disease with heart failure and with stage 5 chronic kidney disease, or end stage renal disease: Secondary | ICD-10-CM | POA: Diagnosis not present

## 2020-12-16 DIAGNOSIS — N186 End stage renal disease: Secondary | ICD-10-CM | POA: Diagnosis not present

## 2020-12-16 DIAGNOSIS — D509 Iron deficiency anemia, unspecified: Secondary | ICD-10-CM | POA: Diagnosis not present

## 2020-12-16 DIAGNOSIS — I482 Chronic atrial fibrillation, unspecified: Secondary | ICD-10-CM | POA: Diagnosis not present

## 2020-12-16 DIAGNOSIS — I5032 Chronic diastolic (congestive) heart failure: Secondary | ICD-10-CM | POA: Diagnosis not present

## 2020-12-16 DIAGNOSIS — E1122 Type 2 diabetes mellitus with diabetic chronic kidney disease: Secondary | ICD-10-CM | POA: Diagnosis not present

## 2020-12-16 DIAGNOSIS — Z992 Dependence on renal dialysis: Secondary | ICD-10-CM | POA: Diagnosis not present

## 2020-12-16 DIAGNOSIS — I5021 Acute systolic (congestive) heart failure: Secondary | ICD-10-CM | POA: Diagnosis not present

## 2020-12-16 DIAGNOSIS — E876 Hypokalemia: Secondary | ICD-10-CM | POA: Diagnosis not present

## 2020-12-16 DIAGNOSIS — Z7901 Long term (current) use of anticoagulants: Secondary | ICD-10-CM | POA: Diagnosis not present

## 2020-12-16 DIAGNOSIS — F5101 Primary insomnia: Secondary | ICD-10-CM | POA: Diagnosis not present

## 2020-12-16 DIAGNOSIS — I82409 Acute embolism and thrombosis of unspecified deep veins of unspecified lower extremity: Secondary | ICD-10-CM | POA: Diagnosis not present

## 2020-12-16 DIAGNOSIS — N179 Acute kidney failure, unspecified: Secondary | ICD-10-CM | POA: Diagnosis not present

## 2020-12-16 DIAGNOSIS — D6949 Other primary thrombocytopenia: Secondary | ICD-10-CM | POA: Diagnosis not present

## 2020-12-16 DIAGNOSIS — I340 Nonrheumatic mitral (valve) insufficiency: Secondary | ICD-10-CM | POA: Diagnosis not present

## 2020-12-16 DIAGNOSIS — I2510 Atherosclerotic heart disease of native coronary artery without angina pectoris: Secondary | ICD-10-CM | POA: Diagnosis not present

## 2020-12-16 DIAGNOSIS — E1151 Type 2 diabetes mellitus with diabetic peripheral angiopathy without gangrene: Secondary | ICD-10-CM | POA: Diagnosis not present

## 2020-12-16 DIAGNOSIS — J9611 Chronic respiratory failure with hypoxia: Secondary | ICD-10-CM | POA: Diagnosis not present

## 2020-12-21 ENCOUNTER — Ambulatory Visit (INDEPENDENT_AMBULATORY_CARE_PROVIDER_SITE_OTHER): Payer: Medicare Other | Admitting: Legal Medicine

## 2020-12-21 ENCOUNTER — Other Ambulatory Visit: Payer: Self-pay

## 2020-12-21 ENCOUNTER — Encounter: Payer: Self-pay | Admitting: Legal Medicine

## 2020-12-21 VITALS — BP 110/60 | HR 66 | Temp 97.3°F | Resp 16 | Ht 73.0 in | Wt 281.0 lb

## 2020-12-21 DIAGNOSIS — H26492 Other secondary cataract, left eye: Secondary | ICD-10-CM | POA: Diagnosis not present

## 2020-12-21 DIAGNOSIS — I5020 Unspecified systolic (congestive) heart failure: Secondary | ICD-10-CM

## 2020-12-21 LAB — HM DIABETES EYE EXAM

## 2020-12-21 NOTE — Progress Notes (Signed)
Established Patient Office Visit  Subjective:  Patient ID: Steven Hester, male    DOB: 11-08-1945  Age: 75 y.o. MRN: [id number]  CC:  Chief Complaint  Patient presents with   Pleural Effusion    HPI [name] presents for CHF.  He has lost 4 lbs and less DOE, he has minimal effusion on chest xray.  Decrease fluid pills to bid now.  Patient presents with HFrEF  that is stable. Diagnosis made 2020.  The course of the disease is stable.  Current medicines include lasix. Patient follows a low cholesterol diet and maintains a weight diary.  Patient is on low salt, low cholesterol diet and avoids alcohol.  Patient denies adverse effects of medicines. Patient is monitoring weight and has lost 4 lbs of weight.  Patient is having non pedal edema, no PND and no PND.  Patient is continuing to see cardiology.   Past Medical History:  Diagnosis Date   Chronic atrial fibrillation (HCC)    Essential hypertension    HLD (hyperlipidemia)    Mixed hyperlipidemia    Other primary thrombocytopenia ([place]) 08/27/2019   Type 2 diabetes mellitus with other specified complication [facility])     Past Surgical History:  Procedure Laterality Date   CORONARY ANGIOPLASTY WITH STENT PLACEMENT      Family History  Problem Relation Age of Onset   Heart attack Mother    GI Disease Father    Cancer Father     Social History   Socioeconomic History   Marital status: Married    Spouse name: Not on file   Number of children: Not on file   Years of education: Not on file   Highest education level: Not on file  Occupational History   Not on file  Tobacco Use   Smoking status: Former    Packs/day: 1.00    Years: 10.00    Pack years: 10.00    Types: Cigarettes   Smokeless tobacco: Never  Substance and Sexual Activity   Alcohol use: Yes    Comment: occaionally    Drug use: Never   Sexual activity: Not on file  Other Topics Concern   Not on file  Social History Narrative   Not on file    Social Determinants of Health   Financial Resource Strain: Low Risk    Difficulty of Paying Living Expenses: Not hard at all  Food Insecurity: No Food Insecurity   Worried About Charity fundraiser in the Last Year: Never true   Ran Out of Food in the Last Year: Never true  Transportation Needs: No Transportation Needs   Lack of Transportation (Medical): No   Lack of Transportation (Non-Medical): No  Physical Activity: Insufficiently Active   Days of Exercise per Week: 2 days   Minutes of Exercise per Session: 30 min  Stress: No Stress Concern Present   Feeling of Stress : Not at all  Social Connections: Moderately Isolated   Frequency of Communication with Friends and Family: Three times a week   Frequency of Social Gatherings with Friends and Family: Never   Attends Religious Services: Never   Marine scientist or Organizations: No   Attends Music therapist: Never   Marital Status: Married  Human resources officer Violence: Not At Risk   Fear of Current or Ex-Partner: No   Emotionally Abused: No   Physically Abused: No   Sexually Abused: No    Outpatient Medications Prior to Visit  Medication Sig  Dispense Refill   amiodarone (PACERONE) 200 MG tablet Take 100 mg by mouth daily.     atorvastatin (LIPITOR) 40 MG tablet Take 1 tablet (40 mg total) by mouth daily. 90 tablet 1   carvedilol (COREG) 3.125 MG tablet Take 3.125 mg by mouth 2 (two) times daily.     furosemide (LASIX) 40 MG tablet Take 40 mg by mouth 2 (two) times daily.     glucose blood test strip 1 each by Other route 2 (two) times daily. Use as instructed 100 each 3   oxyCODONE (OXY IR/ROXICODONE) 5 MG immediate release tablet Take by mouth.     warfarin (COUMADIN) 5 MG tablet Take 5 mg by mouth daily.     nitroGLYCERIN (NITROSTAT) 0.4 MG SL tablet Place under the tongue.     No facility-administered medications prior to visit.    Allergies  Allergen Reactions   Ezetimibe Other (See Comments)     Zetia-unknown reaction    Neopap [Acetaminophen]    Simvastatin Other (See Comments)   Niacin Rash    Other reaction(s): Unknown    ROS Review of Systems  Constitutional:  Negative for chills, fatigue and fever.  HENT:  Negative for congestion, ear pain and sore throat.   Respiratory:  Negative for cough and shortness of breath.   Cardiovascular:  Negative for chest pain.  Gastrointestinal:  Negative for abdominal pain, constipation, diarrhea, nausea and vomiting.  Endocrine: Negative for polydipsia, polyphagia and polyuria.  Genitourinary:  Negative for dysuria and frequency.  Musculoskeletal:  Negative for arthralgias and myalgias.  Neurological:  Negative for dizziness and headaches.  Psychiatric/Behavioral:  Negative for dysphoric mood.        No dysphoria     Objective:    Physical Exam Vitals reviewed.  Constitutional:      General: He is not in acute distress.    Appearance: Normal appearance. He is obese.  HENT:     Right Ear: Tympanic membrane normal.     Left Ear: Tympanic membrane normal.     Mouth/Throat:     Mouth: Mucous membranes are moist.     Pharynx: Oropharynx is clear.  Eyes:     Extraocular Movements: Extraocular movements intact.     Conjunctiva/sclera: Conjunctivae normal.     Pupils: Pupils are equal, round, and reactive to light.  Cardiovascular:     Rate and Rhythm: Normal rate and regular rhythm.     Pulses: Normal pulses.     Heart sounds: Normal heart sounds. No murmur heard.   No gallop.  Pulmonary:     Effort: Pulmonary effort is normal. No respiratory distress.     Breath sounds: No wheezing.  Abdominal:     General: Abdomen is flat. Bowel sounds are normal. There is no distension.     Palpations: Abdomen is soft.     Tenderness: There is no abdominal tenderness.  Musculoskeletal:        General: Normal range of motion.     Cervical back: Normal range of motion.     Right lower leg: No edema.     Left lower leg: No edema.   Skin:    General: Skin is warm.     Capillary Refill: Capillary refill takes less than 2 seconds.  Neurological:     General: No focal deficit present.     Mental Status: He is alert and oriented to person, place, and time. Mental status is at baseline.    BP 110/60   Pulse  66   Temp (!) 97.3 F (36.3 C)   Resp 16   Ht _0  (1.854 m)   Wt 281 lb (127.5 kg)   SpO2 94%   BMI 37.07 kg/m  Wt Readings from Last 3 Encounters:  12/21/20 281 lb (127.5 kg)  12/14/20 288 lb (130.6 kg)  10/20/20 285 lb (129.3 kg)     Health Maintenance Due  Topic Date Due   OPHTHALMOLOGY EXAM  Never done   URINE MICROALBUMIN  Never done   Hepatitis C Screening  Never done   Zoster Vaccines- Shingrix (1 of 2) Never done   INFLUENZA VACCINE  12/06/2020    There are no preventive care reminders to display for this patient.  No results found for: TSH Lab Results  Component Value Date   WBC 6.2 10/20/2020   HGB 10.7 (L) 10/20/2020   HCT 33.9 (L) 10/20/2020   MCV 94 10/20/2020   PLT 112 (L) 10/20/2020   Lab Results  Component Value Date   NA 143 12/14/2020   K 3.6 12/14/2020   CO2 21 12/14/2020   GLUCOSE 139 (H) 12/14/2020   BUN 42 (H) 12/14/2020   CREATININE 2.46 (H) 12/14/2020   BILITOT 0.2 10/20/2020   ALKPHOS 95 10/20/2020   AST 12 10/20/2020   ALT 9 10/20/2020   PROT 6.9 10/20/2020   ALBUMIN 3.7 10/20/2020   CALCIUM 8.7 12/14/2020   EGFR 26 (L) 12/14/2020   Lab Results  Component Value Date   CHOL 125 10/20/2020   Lab Results  Component Value Date   HDL 29 (L) 10/20/2020   Lab Results  Component Value Date   LDLCALC 70 10/20/2020   Lab Results  Component Value Date   TRIG 150 (H) 10/20/2020   Lab Results  Component Value Date   CHOLHDL 4.3 10/20/2020   Lab Results  Component Value Date   HGBA1C 5.2 09/27/2020      Assessment & Plan:   Diagnoses and all orders for this visit: HFrEF (heart failure with reduced ejection fraction) ([facility])  Patient is much  improved with no DOE and less edema ankles    Follow-up: Return if symptoms worsen or fail to improve.    Reinaldo Meeker, MD

## 2020-12-22 DIAGNOSIS — D6949 Other primary thrombocytopenia: Secondary | ICD-10-CM | POA: Diagnosis not present

## 2020-12-22 DIAGNOSIS — I452 Bifascicular block: Secondary | ICD-10-CM | POA: Diagnosis not present

## 2020-12-22 DIAGNOSIS — I82409 Acute embolism and thrombosis of unspecified deep veins of unspecified lower extremity: Secondary | ICD-10-CM | POA: Diagnosis not present

## 2020-12-22 DIAGNOSIS — I429 Cardiomyopathy, unspecified: Secondary | ICD-10-CM | POA: Diagnosis not present

## 2020-12-22 DIAGNOSIS — Z992 Dependence on renal dialysis: Secondary | ICD-10-CM | POA: Diagnosis not present

## 2020-12-22 DIAGNOSIS — I132 Hypertensive heart and chronic kidney disease with heart failure and with stage 5 chronic kidney disease, or end stage renal disease: Secondary | ICD-10-CM | POA: Diagnosis not present

## 2020-12-22 DIAGNOSIS — I5021 Acute systolic (congestive) heart failure: Secondary | ICD-10-CM | POA: Diagnosis not present

## 2020-12-22 DIAGNOSIS — D509 Iron deficiency anemia, unspecified: Secondary | ICD-10-CM | POA: Diagnosis not present

## 2020-12-22 DIAGNOSIS — N186 End stage renal disease: Secondary | ICD-10-CM | POA: Diagnosis not present

## 2020-12-22 DIAGNOSIS — E1122 Type 2 diabetes mellitus with diabetic chronic kidney disease: Secondary | ICD-10-CM | POA: Diagnosis not present

## 2020-12-22 DIAGNOSIS — I482 Chronic atrial fibrillation, unspecified: Secondary | ICD-10-CM | POA: Diagnosis not present

## 2020-12-22 DIAGNOSIS — E1151 Type 2 diabetes mellitus with diabetic peripheral angiopathy without gangrene: Secondary | ICD-10-CM | POA: Diagnosis not present

## 2020-12-22 DIAGNOSIS — N179 Acute kidney failure, unspecified: Secondary | ICD-10-CM | POA: Diagnosis not present

## 2020-12-22 DIAGNOSIS — I2510 Atherosclerotic heart disease of native coronary artery without angina pectoris: Secondary | ICD-10-CM | POA: Diagnosis not present

## 2020-12-22 DIAGNOSIS — F5101 Primary insomnia: Secondary | ICD-10-CM | POA: Diagnosis not present

## 2020-12-22 DIAGNOSIS — J9611 Chronic respiratory failure with hypoxia: Secondary | ICD-10-CM | POA: Diagnosis not present

## 2020-12-22 DIAGNOSIS — Z87891 Personal history of nicotine dependence: Secondary | ICD-10-CM | POA: Diagnosis not present

## 2020-12-22 DIAGNOSIS — E876 Hypokalemia: Secondary | ICD-10-CM | POA: Diagnosis not present

## 2020-12-22 DIAGNOSIS — I4819 Other persistent atrial fibrillation: Secondary | ICD-10-CM | POA: Diagnosis not present

## 2020-12-22 DIAGNOSIS — I5032 Chronic diastolic (congestive) heart failure: Secondary | ICD-10-CM | POA: Diagnosis not present

## 2020-12-22 DIAGNOSIS — I340 Nonrheumatic mitral (valve) insufficiency: Secondary | ICD-10-CM | POA: Diagnosis not present

## 2020-12-22 DIAGNOSIS — E782 Mixed hyperlipidemia: Secondary | ICD-10-CM | POA: Diagnosis not present

## 2020-12-22 DIAGNOSIS — Z9981 Dependence on supplemental oxygen: Secondary | ICD-10-CM | POA: Diagnosis not present

## 2020-12-22 DIAGNOSIS — Z7901 Long term (current) use of anticoagulants: Secondary | ICD-10-CM | POA: Diagnosis not present

## 2020-12-23 DIAGNOSIS — E1122 Type 2 diabetes mellitus with diabetic chronic kidney disease: Secondary | ICD-10-CM | POA: Diagnosis not present

## 2020-12-23 DIAGNOSIS — N179 Acute kidney failure, unspecified: Secondary | ICD-10-CM | POA: Diagnosis not present

## 2020-12-23 DIAGNOSIS — Z87891 Personal history of nicotine dependence: Secondary | ICD-10-CM | POA: Diagnosis not present

## 2020-12-23 DIAGNOSIS — I340 Nonrheumatic mitral (valve) insufficiency: Secondary | ICD-10-CM | POA: Diagnosis not present

## 2020-12-23 DIAGNOSIS — J9611 Chronic respiratory failure with hypoxia: Secondary | ICD-10-CM | POA: Diagnosis not present

## 2020-12-23 DIAGNOSIS — E1151 Type 2 diabetes mellitus with diabetic peripheral angiopathy without gangrene: Secondary | ICD-10-CM | POA: Diagnosis not present

## 2020-12-23 DIAGNOSIS — I82409 Acute embolism and thrombosis of unspecified deep veins of unspecified lower extremity: Secondary | ICD-10-CM | POA: Diagnosis not present

## 2020-12-23 DIAGNOSIS — Z7901 Long term (current) use of anticoagulants: Secondary | ICD-10-CM | POA: Diagnosis not present

## 2020-12-23 DIAGNOSIS — Z992 Dependence on renal dialysis: Secondary | ICD-10-CM | POA: Diagnosis not present

## 2020-12-23 DIAGNOSIS — D6949 Other primary thrombocytopenia: Secondary | ICD-10-CM | POA: Diagnosis not present

## 2020-12-23 DIAGNOSIS — I5032 Chronic diastolic (congestive) heart failure: Secondary | ICD-10-CM | POA: Diagnosis not present

## 2020-12-23 DIAGNOSIS — I2510 Atherosclerotic heart disease of native coronary artery without angina pectoris: Secondary | ICD-10-CM | POA: Diagnosis not present

## 2020-12-23 DIAGNOSIS — D509 Iron deficiency anemia, unspecified: Secondary | ICD-10-CM | POA: Diagnosis not present

## 2020-12-23 DIAGNOSIS — I429 Cardiomyopathy, unspecified: Secondary | ICD-10-CM | POA: Diagnosis not present

## 2020-12-23 DIAGNOSIS — I452 Bifascicular block: Secondary | ICD-10-CM | POA: Diagnosis not present

## 2020-12-23 DIAGNOSIS — F5101 Primary insomnia: Secondary | ICD-10-CM | POA: Diagnosis not present

## 2020-12-23 DIAGNOSIS — I482 Chronic atrial fibrillation, unspecified: Secondary | ICD-10-CM | POA: Diagnosis not present

## 2020-12-23 DIAGNOSIS — E782 Mixed hyperlipidemia: Secondary | ICD-10-CM | POA: Diagnosis not present

## 2020-12-23 DIAGNOSIS — N186 End stage renal disease: Secondary | ICD-10-CM | POA: Diagnosis not present

## 2020-12-23 DIAGNOSIS — E876 Hypokalemia: Secondary | ICD-10-CM | POA: Diagnosis not present

## 2020-12-23 DIAGNOSIS — I132 Hypertensive heart and chronic kidney disease with heart failure and with stage 5 chronic kidney disease, or end stage renal disease: Secondary | ICD-10-CM | POA: Diagnosis not present

## 2020-12-23 DIAGNOSIS — I5021 Acute systolic (congestive) heart failure: Secondary | ICD-10-CM | POA: Diagnosis not present

## 2020-12-23 DIAGNOSIS — Z9981 Dependence on supplemental oxygen: Secondary | ICD-10-CM | POA: Diagnosis not present

## 2020-12-24 DIAGNOSIS — I452 Bifascicular block: Secondary | ICD-10-CM | POA: Diagnosis not present

## 2020-12-24 DIAGNOSIS — I482 Chronic atrial fibrillation, unspecified: Secondary | ICD-10-CM | POA: Diagnosis not present

## 2020-12-24 DIAGNOSIS — Z992 Dependence on renal dialysis: Secondary | ICD-10-CM | POA: Diagnosis not present

## 2020-12-24 DIAGNOSIS — E782 Mixed hyperlipidemia: Secondary | ICD-10-CM | POA: Diagnosis not present

## 2020-12-24 DIAGNOSIS — I2510 Atherosclerotic heart disease of native coronary artery without angina pectoris: Secondary | ICD-10-CM | POA: Diagnosis not present

## 2020-12-24 DIAGNOSIS — I5032 Chronic diastolic (congestive) heart failure: Secondary | ICD-10-CM | POA: Diagnosis not present

## 2020-12-24 DIAGNOSIS — N186 End stage renal disease: Secondary | ICD-10-CM | POA: Diagnosis not present

## 2020-12-24 DIAGNOSIS — I82409 Acute embolism and thrombosis of unspecified deep veins of unspecified lower extremity: Secondary | ICD-10-CM | POA: Diagnosis not present

## 2020-12-24 DIAGNOSIS — Z7901 Long term (current) use of anticoagulants: Secondary | ICD-10-CM | POA: Diagnosis not present

## 2020-12-24 DIAGNOSIS — D6949 Other primary thrombocytopenia: Secondary | ICD-10-CM | POA: Diagnosis not present

## 2020-12-24 DIAGNOSIS — I340 Nonrheumatic mitral (valve) insufficiency: Secondary | ICD-10-CM | POA: Diagnosis not present

## 2020-12-24 DIAGNOSIS — Z87891 Personal history of nicotine dependence: Secondary | ICD-10-CM | POA: Diagnosis not present

## 2020-12-24 DIAGNOSIS — F5101 Primary insomnia: Secondary | ICD-10-CM | POA: Diagnosis not present

## 2020-12-24 DIAGNOSIS — I429 Cardiomyopathy, unspecified: Secondary | ICD-10-CM | POA: Diagnosis not present

## 2020-12-24 DIAGNOSIS — J9611 Chronic respiratory failure with hypoxia: Secondary | ICD-10-CM | POA: Diagnosis not present

## 2020-12-24 DIAGNOSIS — Z9981 Dependence on supplemental oxygen: Secondary | ICD-10-CM | POA: Diagnosis not present

## 2020-12-24 DIAGNOSIS — N179 Acute kidney failure, unspecified: Secondary | ICD-10-CM | POA: Diagnosis not present

## 2020-12-24 DIAGNOSIS — E876 Hypokalemia: Secondary | ICD-10-CM | POA: Diagnosis not present

## 2020-12-24 DIAGNOSIS — I132 Hypertensive heart and chronic kidney disease with heart failure and with stage 5 chronic kidney disease, or end stage renal disease: Secondary | ICD-10-CM | POA: Diagnosis not present

## 2020-12-24 DIAGNOSIS — E1151 Type 2 diabetes mellitus with diabetic peripheral angiopathy without gangrene: Secondary | ICD-10-CM | POA: Diagnosis not present

## 2020-12-24 DIAGNOSIS — D509 Iron deficiency anemia, unspecified: Secondary | ICD-10-CM | POA: Diagnosis not present

## 2020-12-24 DIAGNOSIS — E1122 Type 2 diabetes mellitus with diabetic chronic kidney disease: Secondary | ICD-10-CM | POA: Diagnosis not present

## 2020-12-24 DIAGNOSIS — I5021 Acute systolic (congestive) heart failure: Secondary | ICD-10-CM | POA: Diagnosis not present

## 2020-12-27 DIAGNOSIS — Z87891 Personal history of nicotine dependence: Secondary | ICD-10-CM | POA: Diagnosis not present

## 2020-12-27 DIAGNOSIS — Z7901 Long term (current) use of anticoagulants: Secondary | ICD-10-CM | POA: Diagnosis not present

## 2020-12-27 DIAGNOSIS — D509 Iron deficiency anemia, unspecified: Secondary | ICD-10-CM | POA: Diagnosis not present

## 2020-12-27 DIAGNOSIS — E876 Hypokalemia: Secondary | ICD-10-CM | POA: Diagnosis not present

## 2020-12-27 DIAGNOSIS — Z9981 Dependence on supplemental oxygen: Secondary | ICD-10-CM | POA: Diagnosis not present

## 2020-12-27 DIAGNOSIS — D6949 Other primary thrombocytopenia: Secondary | ICD-10-CM | POA: Diagnosis not present

## 2020-12-27 DIAGNOSIS — I429 Cardiomyopathy, unspecified: Secondary | ICD-10-CM | POA: Diagnosis not present

## 2020-12-27 DIAGNOSIS — E782 Mixed hyperlipidemia: Secondary | ICD-10-CM | POA: Diagnosis not present

## 2020-12-27 DIAGNOSIS — I452 Bifascicular block: Secondary | ICD-10-CM | POA: Diagnosis not present

## 2020-12-27 DIAGNOSIS — E1122 Type 2 diabetes mellitus with diabetic chronic kidney disease: Secondary | ICD-10-CM | POA: Diagnosis not present

## 2020-12-27 DIAGNOSIS — F5101 Primary insomnia: Secondary | ICD-10-CM | POA: Diagnosis not present

## 2020-12-27 DIAGNOSIS — E1151 Type 2 diabetes mellitus with diabetic peripheral angiopathy without gangrene: Secondary | ICD-10-CM | POA: Diagnosis not present

## 2020-12-27 DIAGNOSIS — I340 Nonrheumatic mitral (valve) insufficiency: Secondary | ICD-10-CM | POA: Diagnosis not present

## 2020-12-27 DIAGNOSIS — N179 Acute kidney failure, unspecified: Secondary | ICD-10-CM | POA: Diagnosis not present

## 2020-12-27 DIAGNOSIS — J9611 Chronic respiratory failure with hypoxia: Secondary | ICD-10-CM | POA: Diagnosis not present

## 2020-12-27 DIAGNOSIS — I5021 Acute systolic (congestive) heart failure: Secondary | ICD-10-CM | POA: Diagnosis not present

## 2020-12-27 DIAGNOSIS — I482 Chronic atrial fibrillation, unspecified: Secondary | ICD-10-CM | POA: Diagnosis not present

## 2020-12-27 DIAGNOSIS — N186 End stage renal disease: Secondary | ICD-10-CM | POA: Diagnosis not present

## 2020-12-27 DIAGNOSIS — I82409 Acute embolism and thrombosis of unspecified deep veins of unspecified lower extremity: Secondary | ICD-10-CM | POA: Diagnosis not present

## 2020-12-27 DIAGNOSIS — I2510 Atherosclerotic heart disease of native coronary artery without angina pectoris: Secondary | ICD-10-CM | POA: Diagnosis not present

## 2020-12-27 DIAGNOSIS — Z992 Dependence on renal dialysis: Secondary | ICD-10-CM | POA: Diagnosis not present

## 2020-12-27 DIAGNOSIS — I5032 Chronic diastolic (congestive) heart failure: Secondary | ICD-10-CM | POA: Diagnosis not present

## 2020-12-27 DIAGNOSIS — I132 Hypertensive heart and chronic kidney disease with heart failure and with stage 5 chronic kidney disease, or end stage renal disease: Secondary | ICD-10-CM | POA: Diagnosis not present

## 2020-12-29 ENCOUNTER — Other Ambulatory Visit: Payer: Self-pay

## 2020-12-29 ENCOUNTER — Encounter: Payer: Self-pay | Admitting: Legal Medicine

## 2020-12-29 ENCOUNTER — Ambulatory Visit (INDEPENDENT_AMBULATORY_CARE_PROVIDER_SITE_OTHER): Payer: Medicare Other | Admitting: Legal Medicine

## 2020-12-29 VITALS — BP 100/60 | HR 62 | Temp 97.4°F | Resp 18 | Ht 73.0 in | Wt 277.0 lb

## 2020-12-29 DIAGNOSIS — E782 Mixed hyperlipidemia: Secondary | ICD-10-CM

## 2020-12-29 DIAGNOSIS — I452 Bifascicular block: Secondary | ICD-10-CM | POA: Diagnosis not present

## 2020-12-29 DIAGNOSIS — I482 Chronic atrial fibrillation, unspecified: Secondary | ICD-10-CM | POA: Diagnosis not present

## 2020-12-29 DIAGNOSIS — E1151 Type 2 diabetes mellitus with diabetic peripheral angiopathy without gangrene: Secondary | ICD-10-CM

## 2020-12-29 DIAGNOSIS — Z7901 Long term (current) use of anticoagulants: Secondary | ICD-10-CM

## 2020-12-29 DIAGNOSIS — E1122 Type 2 diabetes mellitus with diabetic chronic kidney disease: Secondary | ICD-10-CM | POA: Diagnosis not present

## 2020-12-29 DIAGNOSIS — N185 Chronic kidney disease, stage 5: Secondary | ICD-10-CM

## 2020-12-29 DIAGNOSIS — Z6836 Body mass index (BMI) 36.0-36.9, adult: Secondary | ICD-10-CM

## 2020-12-29 DIAGNOSIS — I2510 Atherosclerotic heart disease of native coronary artery without angina pectoris: Secondary | ICD-10-CM | POA: Diagnosis not present

## 2020-12-29 DIAGNOSIS — I82409 Acute embolism and thrombosis of unspecified deep veins of unspecified lower extremity: Secondary | ICD-10-CM | POA: Diagnosis not present

## 2020-12-29 DIAGNOSIS — Z9981 Dependence on supplemental oxygen: Secondary | ICD-10-CM | POA: Diagnosis not present

## 2020-12-29 DIAGNOSIS — I132 Hypertensive heart and chronic kidney disease with heart failure and with stage 5 chronic kidney disease, or end stage renal disease: Secondary | ICD-10-CM | POA: Diagnosis not present

## 2020-12-29 DIAGNOSIS — I5021 Acute systolic (congestive) heart failure: Secondary | ICD-10-CM | POA: Diagnosis not present

## 2020-12-29 DIAGNOSIS — I5032 Chronic diastolic (congestive) heart failure: Secondary | ICD-10-CM | POA: Diagnosis not present

## 2020-12-29 DIAGNOSIS — F5101 Primary insomnia: Secondary | ICD-10-CM | POA: Diagnosis not present

## 2020-12-29 DIAGNOSIS — G4733 Obstructive sleep apnea (adult) (pediatric): Secondary | ICD-10-CM

## 2020-12-29 DIAGNOSIS — Z992 Dependence on renal dialysis: Secondary | ICD-10-CM | POA: Diagnosis not present

## 2020-12-29 DIAGNOSIS — I5020 Unspecified systolic (congestive) heart failure: Secondary | ICD-10-CM

## 2020-12-29 DIAGNOSIS — Z87891 Personal history of nicotine dependence: Secondary | ICD-10-CM | POA: Diagnosis not present

## 2020-12-29 DIAGNOSIS — N186 End stage renal disease: Secondary | ICD-10-CM | POA: Diagnosis not present

## 2020-12-29 DIAGNOSIS — N179 Acute kidney failure, unspecified: Secondary | ICD-10-CM | POA: Diagnosis not present

## 2020-12-29 DIAGNOSIS — J9611 Chronic respiratory failure with hypoxia: Secondary | ICD-10-CM | POA: Diagnosis not present

## 2020-12-29 DIAGNOSIS — D6869 Other thrombophilia: Secondary | ICD-10-CM | POA: Diagnosis not present

## 2020-12-29 DIAGNOSIS — D509 Iron deficiency anemia, unspecified: Secondary | ICD-10-CM | POA: Diagnosis not present

## 2020-12-29 DIAGNOSIS — D6949 Other primary thrombocytopenia: Secondary | ICD-10-CM | POA: Diagnosis not present

## 2020-12-29 DIAGNOSIS — I429 Cardiomyopathy, unspecified: Secondary | ICD-10-CM | POA: Diagnosis not present

## 2020-12-29 DIAGNOSIS — E876 Hypokalemia: Secondary | ICD-10-CM | POA: Diagnosis not present

## 2020-12-29 DIAGNOSIS — I340 Nonrheumatic mitral (valve) insufficiency: Secondary | ICD-10-CM | POA: Diagnosis not present

## 2020-12-29 DIAGNOSIS — I10 Essential (primary) hypertension: Secondary | ICD-10-CM

## 2020-12-29 NOTE — Progress Notes (Addendum)
Established Patient Office Visit  Subjective:  Patient ID: Steven Hester, male    DOB: 1945/10/26  Age: 75 y.o. MRN: [id number]  CC:  Chief Complaint  Patient presents with   Diabetes   Hypertension   Hyperlipidemia    HPI Steven Hester presents for chronic visit  He sees Dr. Raquel Sarna for cardiology, his last INR was 3.1, he will be rechecked Monday.  Patient present with type 2 diabetes.  Specifically, this is type 2, noninsulin requiring diabetes, complicated by PAD.  Compliance with treatment has been good; patient take medicines as directed, maintains diet and exercise regimen, follows up as directed, and is keeping glucose diary.  Date of  diagnosis 2010.  Depression screen has been performed.Tobacco screen nnsmoker. Current medicines for diabetes diet.  Patient is on none for renal protection and atorvastatin for cholesterol control.  Patient performs foot exams daily and last ophthalmologic exam was no.   Patient has renal failure and was on dialysis , stopped 2 months a go  Patient presents with HFrEF  that is stable. Diagnosis made 2000.  The course of the disease is stable.  Current medicines include warfarin, coreg, imdur and amiodarone. Patient follows a low cholesterol diet and maintains a weight diary.  Patient is on low salt, low cholesterol diet and avoids alcohol.  Patient denies adverse effects of medicines. Patient is monitoring weight and has lost 11 lbs of weight.  Patient is having no pedal edema, some PND and no PND.  Patient is continuing to see cardiology.   Past Medical History:  Diagnosis Date   Chronic atrial fibrillation (HCC)    Essential hypertension    HLD (hyperlipidemia)    Mixed hyperlipidemia    Other primary thrombocytopenia ([place]) 08/27/2019   Type 2 diabetes mellitus with other specified complication [facility])     Past Surgical History:  Procedure Laterality Date   CORONARY ANGIOPLASTY WITH STENT PLACEMENT      Family History  Problem  Relation Age of Onset   Heart attack Mother    GI Disease Father    Cancer Father     Social History   Socioeconomic History   Marital status: Married    Spouse name: Not on file   Number of children: Not on file   Years of education: Not on file   Highest education level: Not on file  Occupational History   Not on file  Tobacco Use   Smoking status: Former    Packs/day: 1.00    Years: 10.00    Pack years: 10.00    Types: Cigarettes   Smokeless tobacco: Never  Substance and Sexual Activity   Alcohol use: Yes    Comment: occaionally    Drug use: Never   Sexual activity: Not Currently    Partners: Female  Other Topics Concern   Not on file  Social History Narrative   Not on file   Social Determinants of Health   Financial Resource Strain: Low Risk    Difficulty of Paying Living Expenses: Not hard at all  Food Insecurity: No Food Insecurity   Worried About Charity fundraiser in the Last Year: Never true   Ran Out of Food in the Last Year: Never true  Transportation Needs: No Transportation Needs   Lack of Transportation (Medical): No   Lack of Transportation (Non-Medical): No  Physical Activity: Insufficiently Active   Days of Exercise per Week: 2 days   Minutes of Exercise per Session: 30 min  Stress: No Stress Concern Present   Feeling of Stress : Not at all  Social Connections: Moderately Isolated   Frequency of Communication with Friends and Family: Three times a week   Frequency of Social Gatherings with Friends and Family: Never   Attends Religious Services: Never   Marine scientist or Organizations: No   Attends Music therapist: Never   Marital Status: Married  Human resources officer Violence: Not At Risk   Fear of Current or Ex-Partner: No   Emotionally Abused: No   Physically Abused: No   Sexually Abused: No    Outpatient Medications Prior to Visit  Medication Sig Dispense Refill   amiodarone (PACERONE) 200 MG tablet Take 100 mg by  mouth daily.     atorvastatin (LIPITOR) 40 MG tablet Take 1 tablet (40 mg total) by mouth daily. 90 tablet 1   carvedilol (COREG) 3.125 MG tablet Take 3.125 mg by mouth 2 (two) times daily.     furosemide (LASIX) 40 MG tablet Take 40 mg by mouth 2 (two) times daily.     isosorbide mononitrate (IMDUR) 30 MG 24 hr tablet Take 30 mg by mouth daily.     Sennosides (LAXATIVE) 25 MG TABS Take by mouth.     warfarin (COUMADIN) 5 MG tablet Take 5 mg by mouth daily.     glucose blood test strip 1 each by Other route 2 (two) times daily. Use as instructed 100 each 3   nitroGLYCERIN (NITROSTAT) 0.4 MG SL tablet Place under the tongue.     oxyCODONE (OXY IR/ROXICODONE) 5 MG immediate release tablet Take by mouth.     No facility-administered medications prior to visit.    Allergies  Allergen Reactions   Ezetimibe Other (See Comments)    Zetia-unknown reaction    Neopap [Acetaminophen]    Simvastatin Other (See Comments)    Other reaction(s): Myalgias (intolerance)   Niacin Rash    Other reaction(s): Unknown    ROS Review of Systems  Constitutional:  Negative for chills, fatigue and fever.  HENT:  Negative for congestion, ear pain and sore throat.   Respiratory:  Negative for cough and shortness of breath.   Cardiovascular:  Negative for chest pain.  Gastrointestinal:  Negative for abdominal pain, constipation, diarrhea, nausea and vomiting.  Endocrine: Negative for polydipsia, polyphagia and polyuria.  Genitourinary:  Negative for dysuria and frequency.  Musculoskeletal:  Negative for arthralgias and myalgias.  Neurological:  Negative for dizziness and headaches.  Psychiatric/Behavioral:  Negative for dysphoric mood.        No dysphoria     Objective:    Physical Exam Vitals reviewed.  Constitutional:      General: He is not in acute distress.    Appearance: Normal appearance. He is obese.  HENT:     Head: Normocephalic.     Right Ear: Tympanic membrane, ear canal and external  ear normal.     Left Ear: Tympanic membrane, ear canal and external ear normal.  Eyes:     Extraocular Movements: Extraocular movements intact.     Conjunctiva/sclera: Conjunctivae normal.     Pupils: Pupils are equal, round, and reactive to light.  Cardiovascular:     Rate and Rhythm: Normal rate and regular rhythm.     Pulses: Normal pulses.     Heart sounds: Normal heart sounds. No murmur heard.   No gallop.  Pulmonary:     Effort: Pulmonary effort is normal. No respiratory distress.     Breath  sounds: Normal breath sounds. No wheezing.  Abdominal:     General: Abdomen is flat. Bowel sounds are normal. There is no distension.     Palpations: Abdomen is soft.     Tenderness: There is no abdominal tenderness.  Musculoskeletal:     Cervical back: Normal range of motion.     Right lower leg: No edema.     Left lower leg: No edema.  Skin:    General: Skin is warm.     Capillary Refill: Capillary refill takes less than 2 seconds.  Neurological:     General: No focal deficit present.     Mental Status: He is alert and oriented to person, place, and time. Mental status is at baseline.   BP 100/60   Pulse 62   Temp (!) 97.4 F (36.3 C)   Resp 18   Ht 6' 1"  (1.854 m)   Wt 277 lb (125.6 kg)   SpO2 98%   BMI 36.55 kg/m  Wt Readings from Last 3 Encounters:  12/29/20 277 lb (125.6 kg)  12/21/20 281 lb (127.5 kg)  12/14/20 288 lb (130.6 kg)     Health Maintenance Due  Topic Date Due   OPHTHALMOLOGY EXAM  Never done   URINE MICROALBUMIN  Never done   Hepatitis C Screening  Never done   Zoster Vaccines- Shingrix (1 of 2) Never done   INFLUENZA VACCINE  12/06/2020    There are no preventive care reminders to display for this patient.  No results found for: TSH Lab Results  Component Value Date   WBC 6.2 10/20/2020   HGB 10.7 (L) 10/20/2020   HCT 33.9 (L) 10/20/2020   MCV 94 10/20/2020   PLT 112 (L) 10/20/2020   Lab Results  Component Value Date   NA 143 12/14/2020    K 3.6 12/14/2020   CO2 21 12/14/2020   GLUCOSE 139 (H) 12/14/2020   BUN 42 (H) 12/14/2020   CREATININE 2.46 (H) 12/14/2020   BILITOT 0.2 10/20/2020   ALKPHOS 95 10/20/2020   AST 12 10/20/2020   ALT 9 10/20/2020   PROT 6.9 10/20/2020   ALBUMIN 3.7 10/20/2020   CALCIUM 8.7 12/14/2020   EGFR 26 (L) 12/14/2020   Lab Results  Component Value Date   CHOL 125 10/20/2020   Lab Results  Component Value Date   HDL 29 (L) 10/20/2020   Lab Results  Component Value Date   LDLCALC 70 10/20/2020   Lab Results  Component Value Date   TRIG 150 (H) 10/20/2020   Lab Results  Component Value Date   CHOLHDL 4.3 10/20/2020   Lab Results  Component Value Date   HGBA1C 5.2 09/27/2020      Assessment & Plan:   Problem List Items Addressed This Visit       Cardiovascular and Mediastinum   Diabetes mellitus type 2 with peripheral artery disease ([place]) - Primary   Relevant Medications   isosorbide mononitrate (IMDUR) 30 MG 24 hr tablet   Other Relevant Orders   Hemoglobin A1c An individual care plan for diabetes was established and reinforced today.  The patient's status was assessed using clinical findings on exam, labs and diagnostic testing. Patient success at meeting goals based on disease specific evidence-based guidelines and found to be good controlled. Medications were assessed and patient's understanding of the medical issues , including barriers were assessed. Recommend adherence to a diabetic diet, a graduated exercise program, HgbA1c level is checked quarterly, and urine microalbumin performed yearly .  Annual mono-filament sensation testing performed. Lower blood pressure and control hyperlipidemia is important. Get annual eye exams and annual flu shots and smoking cessation discussed.  Self management goals were discussed.     Essential hypertension   Relevant Medications   isosorbide mononitrate (IMDUR) 30 MG 24 hr tablet   Other Relevant Orders   Comprehensive  metabolic panel   CBC with Differential/Platelet An individual hypertension care plan was established and reinforced today.  The patient's status was assessed using clinical findings on exam and labs or diagnostic tests. The patient's success at meeting treatment goals on disease specific evidence-based guidelines and found to be well controlled. SELF MANAGEMENT: The patient and I together assessed ways to personally work towards obtaining the recommended goals. RECOMMENDATIONS: avoid decongestants found in common cold remedies, decrease consumption of alcohol, perform routine monitoring of BP with home BP cuff, exercise, reduction of dietary salt, take medicines as prescribed, try not to miss doses and quit smoking.  Regular exercise and maintaining a healthy weight is needed.  Stress reduction may help. A CLINICAL SUMMARY including written plan identify barriers to care unique to individual due to social or financial issues.  We attempt to mutually creat solutions for individual and family understanding.     Chronic atrial fibrillation (HCC)   Relevant Medications   isosorbide mononitrate (IMDUR) 30 MG 24 hr tablet Patient has a diagnosis of chronic atrial fibrillation.   Patient is on warfarin last INR 3.1 testing at Cardiology and has controlled ventricular response.  Patient is CV stable.     HFrEF (heart failure with reduced ejection fraction) (HCC)   Relevant Medications   isosorbide mononitrate (IMDUR) 30 MG 24 hr tablet An individualized care plan was established and reinforced.  The patient's disease status was assessed using clinical finding son exam today, labs, and/or other diagnostic testing such as x-rays, to determine the patient's success in meeting treatmentgoalsbased on disease-based guidelines and found to be improving. But not at goal yet. Medications prescriptions no changes Laboratory tests ordered to be performed today include routine. RECOMMENDATIONS: given include see  cardiology.  Call physician is patient gains 3 lbs in one day or 5 lbs for one week.  Call for progressive PND, orthopnea or increased pedal edema.      Respiratory   Obstructive sleep apnea Using CPAP consistently every night and medically benefiting from its use.      Genitourinary   CKD (chronic kidney disease) stage 5, GFR less than 15 ml/min (HCC) AN INDIVIDUAL CARE PLAN for CKD was established and reinforced today.  The patient's status was assessed using clinical findings on exam, labs, and other diagnostic testing. Patient's success at meeting treatment goals based on disease specific evidence-bassed guidelines and found to be in good control. RECOMMENDATIONS include maintaining present medicines and treatment.      Hematopoietic and Hemostatic   Acquired thrombophilia ([place]) Patient is on chronic coumadin for Atrial fibrillation     Other   Mixed hyperlipidemia   Relevant Medications   isosorbide mononitrate (IMDUR) 30 MG 24 hr tablet   Other Relevant Orders   Lipid panel AN INDIVIDUAL CARE PLAN for hyperlipidemia/ cholesterol was established and reinforced today.  The patient's status was assessed using clinical findings on exam, lab and other diagnostic tests. The patient's disease status was assessed based on evidence-based guidelines and found to be fair controlled. MEDICATIONS were reviewed. SELF MANAGEMENT GOALS have been discussed and patient's success at attaining the goal of low cholesterol was assessed. RECOMMENDATION  given include regular exercise 3 days a week and low cholesterol/low fat diet. CLINICAL SUMMARY including written plan to identify barriers unique to the patient due to social or economic  reasons was discussed.     Current use of long term anticoagulation Patient on long term warfarin    BMI 36.0-36.9,adult An individualize plan was formulated for obesity using patient history and physical exam to encourage weight loss.  An evidence based program was  formulated.  Patient is to cut portion size with meals and to plan physical exercise 3 days a week at least 20 minutes.  Weight watchers and other programs are helpful.  Planned amount of weight loss 10 lbs. with   diabetes and hypertension, he meets criteria for morbid obesity  Morbid obesity An individualize plan was formulated for obesity using patient history and physical exam to encourage weight loss.  An evidence based program was formulated.  Patient is to cut portion size with meals and to plan physical exercise 3 days a week at least 20 minutes.  Weight watchers and other programs are helpful.  Planned amount of weight loss 10 lbs.    Other Visit Diagnoses     Chronic diastolic heart failure (HCC)       Relevant Medications   isosorbide mononitrate (IMDUR) 30 MG 24 hr tablet As above with heart failure      30 minute visit with review of records    Follow-up: Return in about 4 months (around 04/30/2021) for fasting.    Reinaldo Meeker, MD

## 2020-12-30 DIAGNOSIS — Z992 Dependence on renal dialysis: Secondary | ICD-10-CM | POA: Diagnosis not present

## 2020-12-30 DIAGNOSIS — E1122 Type 2 diabetes mellitus with diabetic chronic kidney disease: Secondary | ICD-10-CM | POA: Diagnosis not present

## 2020-12-30 DIAGNOSIS — I5032 Chronic diastolic (congestive) heart failure: Secondary | ICD-10-CM | POA: Diagnosis not present

## 2020-12-30 DIAGNOSIS — F5101 Primary insomnia: Secondary | ICD-10-CM | POA: Diagnosis not present

## 2020-12-30 DIAGNOSIS — N179 Acute kidney failure, unspecified: Secondary | ICD-10-CM | POA: Diagnosis not present

## 2020-12-30 DIAGNOSIS — J9611 Chronic respiratory failure with hypoxia: Secondary | ICD-10-CM | POA: Diagnosis not present

## 2020-12-30 DIAGNOSIS — D6949 Other primary thrombocytopenia: Secondary | ICD-10-CM | POA: Diagnosis not present

## 2020-12-30 DIAGNOSIS — I429 Cardiomyopathy, unspecified: Secondary | ICD-10-CM | POA: Diagnosis not present

## 2020-12-30 DIAGNOSIS — Z9981 Dependence on supplemental oxygen: Secondary | ICD-10-CM | POA: Diagnosis not present

## 2020-12-30 DIAGNOSIS — I82409 Acute embolism and thrombosis of unspecified deep veins of unspecified lower extremity: Secondary | ICD-10-CM | POA: Diagnosis not present

## 2020-12-30 DIAGNOSIS — I2510 Atherosclerotic heart disease of native coronary artery without angina pectoris: Secondary | ICD-10-CM | POA: Diagnosis not present

## 2020-12-30 DIAGNOSIS — I132 Hypertensive heart and chronic kidney disease with heart failure and with stage 5 chronic kidney disease, or end stage renal disease: Secondary | ICD-10-CM | POA: Diagnosis not present

## 2020-12-30 DIAGNOSIS — D509 Iron deficiency anemia, unspecified: Secondary | ICD-10-CM | POA: Diagnosis not present

## 2020-12-30 DIAGNOSIS — I482 Chronic atrial fibrillation, unspecified: Secondary | ICD-10-CM | POA: Diagnosis not present

## 2020-12-30 DIAGNOSIS — Z7901 Long term (current) use of anticoagulants: Secondary | ICD-10-CM | POA: Diagnosis not present

## 2020-12-30 DIAGNOSIS — I340 Nonrheumatic mitral (valve) insufficiency: Secondary | ICD-10-CM | POA: Diagnosis not present

## 2020-12-30 DIAGNOSIS — E782 Mixed hyperlipidemia: Secondary | ICD-10-CM | POA: Diagnosis not present

## 2020-12-30 DIAGNOSIS — E1151 Type 2 diabetes mellitus with diabetic peripheral angiopathy without gangrene: Secondary | ICD-10-CM | POA: Diagnosis not present

## 2020-12-30 DIAGNOSIS — I452 Bifascicular block: Secondary | ICD-10-CM | POA: Diagnosis not present

## 2020-12-30 DIAGNOSIS — N186 End stage renal disease: Secondary | ICD-10-CM | POA: Diagnosis not present

## 2020-12-30 DIAGNOSIS — Z87891 Personal history of nicotine dependence: Secondary | ICD-10-CM | POA: Diagnosis not present

## 2020-12-30 DIAGNOSIS — E876 Hypokalemia: Secondary | ICD-10-CM | POA: Diagnosis not present

## 2020-12-30 DIAGNOSIS — I5021 Acute systolic (congestive) heart failure: Secondary | ICD-10-CM | POA: Diagnosis not present

## 2020-12-30 LAB — COMPREHENSIVE METABOLIC PANEL
ALT: 11 IU/L (ref 0–44)
AST: 16 IU/L (ref 0–40)
Albumin/Globulin Ratio: 1.6 (ref 1.2–2.2)
Albumin: 4.5 g/dL (ref 3.7–4.7)
Alkaline Phosphatase: 82 IU/L (ref 44–121)
BUN/Creatinine Ratio: 18 (ref 10–24)
BUN: 59 mg/dL — ABNORMAL HIGH (ref 8–27)
Bilirubin Total: 0.4 mg/dL (ref 0.0–1.2)
CO2: 23 mmol/L (ref 20–29)
Calcium: 9.3 mg/dL (ref 8.6–10.2)
Chloride: 101 mmol/L (ref 96–106)
Creatinine, Ser: 3.21 mg/dL — ABNORMAL HIGH (ref 0.76–1.27)
Globulin, Total: 2.8 g/dL (ref 1.5–4.5)
Glucose: 106 mg/dL — ABNORMAL HIGH (ref 65–99)
Potassium: 5.2 mmol/L (ref 3.5–5.2)
Sodium: 139 mmol/L (ref 134–144)
Total Protein: 7.3 g/dL (ref 6.0–8.5)
eGFR: 19 mL/min/{1.73_m2} — ABNORMAL LOW (ref >59)

## 2020-12-30 LAB — CBC WITH DIFFERENTIAL/PLATELET
Basophils Absolute: 0.1 10*3/uL (ref 0.0–0.2)
Basos: 1 %
EOS (ABSOLUTE): 0.1 10*3/uL (ref 0.0–0.4)
Eos: 2 %
Hematocrit: 38.7 % (ref 37.5–51.0)
Hemoglobin: 12.1 g/dL — ABNORMAL LOW (ref 13.0–17.7)
Immature Grans (Abs): 0 10*3/uL (ref 0.0–0.1)
Immature Granulocytes: 0 %
Lymphocytes Absolute: 2.1 10*3/uL (ref 0.7–3.1)
Lymphs: 35 %
MCH: 27.9 pg (ref 26.6–33.0)
MCHC: 31.3 g/dL — ABNORMAL LOW (ref 31.5–35.7)
MCV: 89 fL (ref 79–97)
Monocytes Absolute: 0.5 10*3/uL (ref 0.1–0.9)
Monocytes: 8 %
Neutrophils Absolute: 3.3 10*3/uL (ref 1.4–7.0)
Neutrophils: 54 %
Platelets: 127 10*3/uL — ABNORMAL LOW (ref 150–450)
RBC: 4.34 x10E6/uL (ref 4.14–5.80)
RDW: 15.2 % (ref 11.6–15.4)
WBC: 6.1 10*3/uL (ref 3.4–10.8)

## 2020-12-30 LAB — HEMOGLOBIN A1C
Est. average glucose Bld gHb Est-mCnc: 128 mg/dL
Hgb A1c MFr Bld: 6.1 % — ABNORMAL HIGH (ref 4.8–5.6)

## 2020-12-30 LAB — LIPID PANEL
Chol/HDL Ratio: 4.1 ratio (ref 0.0–5.0)
Cholesterol, Total: 127 mg/dL (ref 100–199)
HDL: 31 mg/dL — ABNORMAL LOW (ref >39)
LDL Chol Calc (NIH): 73 mg/dL (ref 0–99)
Triglycerides: 125 mg/dL (ref 0–149)
VLDL Cholesterol Cal: 23 mg/dL (ref 5–40)

## 2020-12-30 LAB — CARDIOVASCULAR RISK ASSESSMENT

## 2020-12-30 NOTE — Progress Notes (Signed)
Glucose 106, kidney tests stage 4, liver tests normal, A1c 6.1 good, cholesterol good, cbc stable lp

## 2021-01-03 DIAGNOSIS — I10 Essential (primary) hypertension: Secondary | ICD-10-CM | POA: Diagnosis not present

## 2021-01-03 DIAGNOSIS — J9611 Chronic respiratory failure with hypoxia: Secondary | ICD-10-CM | POA: Diagnosis not present

## 2021-01-03 DIAGNOSIS — Z955 Presence of coronary angioplasty implant and graft: Secondary | ICD-10-CM | POA: Diagnosis not present

## 2021-01-03 DIAGNOSIS — I4819 Other persistent atrial fibrillation: Secondary | ICD-10-CM | POA: Diagnosis not present

## 2021-01-03 DIAGNOSIS — R001 Bradycardia, unspecified: Secondary | ICD-10-CM | POA: Diagnosis not present

## 2021-01-03 DIAGNOSIS — I2510 Atherosclerotic heart disease of native coronary artery without angina pectoris: Secondary | ICD-10-CM | POA: Diagnosis not present

## 2021-01-03 DIAGNOSIS — I420 Dilated cardiomyopathy: Secondary | ICD-10-CM | POA: Diagnosis not present

## 2021-01-03 DIAGNOSIS — I452 Bifascicular block: Secondary | ICD-10-CM | POA: Diagnosis not present

## 2021-01-03 DIAGNOSIS — E785 Hyperlipidemia, unspecified: Secondary | ICD-10-CM | POA: Diagnosis not present

## 2021-01-03 DIAGNOSIS — I340 Nonrheumatic mitral (valve) insufficiency: Secondary | ICD-10-CM | POA: Diagnosis not present

## 2021-01-03 DIAGNOSIS — I252 Old myocardial infarction: Secondary | ICD-10-CM | POA: Diagnosis not present

## 2021-01-03 DIAGNOSIS — Z45018 Encounter for adjustment and management of other part of cardiac pacemaker: Secondary | ICD-10-CM | POA: Diagnosis not present

## 2021-01-03 DIAGNOSIS — I255 Ischemic cardiomyopathy: Secondary | ICD-10-CM | POA: Diagnosis not present

## 2021-01-03 DIAGNOSIS — Z7901 Long term (current) use of anticoagulants: Secondary | ICD-10-CM | POA: Diagnosis not present

## 2021-01-04 ENCOUNTER — Telehealth: Payer: Self-pay | Admitting: *Deleted

## 2021-01-04 NOTE — Chronic Care Management (AMB) (Signed)
  Chronic Care Management   Note  01/04/2021 Name: Steven Hester MRN: [id number] DOB: 05/01/1946  Steven Hester is a 75 y.o. year old male who is a primary care patient of Steven Anes, MD. I reached out to [name] by phone today in response to a referral sent by Steven Hester PCP, Dr. Henrene Pastor.      Steven Hester was given information about Chronic Care Management services today including:  CCM service includes personalized support from designated clinical staff supervised by his physician, including individualized plan of care and coordination with other care providers 24/7 contact phone numbers for assistance for urgent and routine care needs. Service will only be billed when office clinical staff spend 20 minutes or more in a month to coordinate care. Only one practitioner may furnish and bill the service in a calendar month. The patient may stop CCM services at any time (effective at the end of the month) by phone call to the office staff. The patient will be responsible for cost sharing (co-pay) of up to 20% of the service fee (after annual deductible is met).  Patient agreed to services and verbal consent obtained.   Follow up plan: Telephone appointment with care management team member scheduled for:01/11/21  [place] Management  Direct Dial: [phone]

## 2021-01-05 DIAGNOSIS — J9611 Chronic respiratory failure with hypoxia: Secondary | ICD-10-CM | POA: Diagnosis not present

## 2021-01-11 ENCOUNTER — Telehealth: Payer: Medicare Other

## 2021-01-11 ENCOUNTER — Telehealth: Payer: Self-pay

## 2021-01-11 DIAGNOSIS — I4819 Other persistent atrial fibrillation: Secondary | ICD-10-CM | POA: Diagnosis not present

## 2021-01-11 NOTE — Telephone Encounter (Signed)
  Care Management   Follow Up Note   01/11/2021 Name: Steven Hester MRN: [id number] DOB: 03/23/1946   Referred by: Lillard Anes, MD Reason for referral : Appointment (No answer)   An unsuccessful telephone outreach was attempted today. The patient was referred to the case management team for assistance with care management and care coordination.  Placed call to patients home phone with no answer and no machine. Called cell phone and voicemail was full.  Follow Up Plan: The care management team will reach out to the patient again over the next 7 days.   Tomasa Rand RN, BSN, CEN RN Case Freight forwarder - [employer] exhibitions officer Mobile: [phone]

## 2021-01-12 ENCOUNTER — Telehealth: Payer: Self-pay | Admitting: Legal Medicine

## 2021-01-12 NOTE — Telephone Encounter (Signed)
Steven Hester called this morning stating he didn't receive a call yesterday for his CCM appt. He is requesting a call today.

## 2021-01-14 DIAGNOSIS — M25562 Pain in left knee: Secondary | ICD-10-CM | POA: Diagnosis not present

## 2021-01-14 DIAGNOSIS — I120 Hypertensive chronic kidney disease with stage 5 chronic kidney disease or end stage renal disease: Secondary | ICD-10-CM | POA: Diagnosis not present

## 2021-01-14 DIAGNOSIS — M1712 Unilateral primary osteoarthritis, left knee: Secondary | ICD-10-CM | POA: Diagnosis not present

## 2021-01-14 DIAGNOSIS — E1122 Type 2 diabetes mellitus with diabetic chronic kidney disease: Secondary | ICD-10-CM | POA: Diagnosis not present

## 2021-01-14 DIAGNOSIS — W19XXXA Unspecified fall, initial encounter: Secondary | ICD-10-CM | POA: Diagnosis not present

## 2021-01-14 DIAGNOSIS — R6889 Other general symptoms and signs: Secondary | ICD-10-CM | POA: Diagnosis not present

## 2021-01-14 DIAGNOSIS — N186 End stage renal disease: Secondary | ICD-10-CM | POA: Diagnosis not present

## 2021-01-14 DIAGNOSIS — M7989 Other specified soft tissue disorders: Secondary | ICD-10-CM | POA: Diagnosis not present

## 2021-01-14 DIAGNOSIS — L03116 Cellulitis of left lower limb: Secondary | ICD-10-CM | POA: Diagnosis not present

## 2021-01-14 DIAGNOSIS — S8002XA Contusion of left knee, initial encounter: Secondary | ICD-10-CM | POA: Diagnosis not present

## 2021-01-14 DIAGNOSIS — Z743 Need for continuous supervision: Secondary | ICD-10-CM | POA: Diagnosis not present

## 2021-01-14 DIAGNOSIS — Z7901 Long term (current) use of anticoagulants: Secondary | ICD-10-CM | POA: Diagnosis not present

## 2021-01-14 DIAGNOSIS — Z992 Dependence on renal dialysis: Secondary | ICD-10-CM | POA: Diagnosis not present

## 2021-01-14 DIAGNOSIS — R609 Edema, unspecified: Secondary | ICD-10-CM | POA: Diagnosis not present

## 2021-01-14 DIAGNOSIS — M25462 Effusion, left knee: Secondary | ICD-10-CM | POA: Diagnosis not present

## 2021-01-15 DIAGNOSIS — Z45018 Encounter for adjustment and management of other part of cardiac pacemaker: Secondary | ICD-10-CM | POA: Diagnosis not present

## 2021-01-15 DIAGNOSIS — N183 Chronic kidney disease, stage 3 unspecified: Secondary | ICD-10-CM | POA: Diagnosis not present

## 2021-01-15 DIAGNOSIS — I129 Hypertensive chronic kidney disease with stage 1 through stage 4 chronic kidney disease, or unspecified chronic kidney disease: Secondary | ICD-10-CM | POA: Diagnosis not present

## 2021-01-15 DIAGNOSIS — I4819 Other persistent atrial fibrillation: Secondary | ICD-10-CM | POA: Diagnosis not present

## 2021-01-15 DIAGNOSIS — M25469 Effusion, unspecified knee: Secondary | ICD-10-CM | POA: Diagnosis not present

## 2021-01-15 DIAGNOSIS — M25462 Effusion, left knee: Secondary | ICD-10-CM | POA: Diagnosis not present

## 2021-01-15 DIAGNOSIS — M25562 Pain in left knee: Secondary | ICD-10-CM | POA: Diagnosis not present

## 2021-01-15 DIAGNOSIS — E1122 Type 2 diabetes mellitus with diabetic chronic kidney disease: Secondary | ICD-10-CM | POA: Diagnosis not present

## 2021-01-15 DIAGNOSIS — Z7901 Long term (current) use of anticoagulants: Secondary | ICD-10-CM | POA: Diagnosis not present

## 2021-01-15 DIAGNOSIS — Z87891 Personal history of nicotine dependence: Secondary | ICD-10-CM | POA: Diagnosis not present

## 2021-01-16 DIAGNOSIS — Z7901 Long term (current) use of anticoagulants: Secondary | ICD-10-CM | POA: Diagnosis not present

## 2021-01-16 DIAGNOSIS — E1122 Type 2 diabetes mellitus with diabetic chronic kidney disease: Secondary | ICD-10-CM | POA: Diagnosis not present

## 2021-01-16 DIAGNOSIS — I4811 Longstanding persistent atrial fibrillation: Secondary | ICD-10-CM | POA: Diagnosis not present

## 2021-01-16 DIAGNOSIS — I129 Hypertensive chronic kidney disease with stage 1 through stage 4 chronic kidney disease, or unspecified chronic kidney disease: Secondary | ICD-10-CM | POA: Diagnosis not present

## 2021-01-16 DIAGNOSIS — N183 Chronic kidney disease, stage 3 unspecified: Secondary | ICD-10-CM | POA: Diagnosis not present

## 2021-01-16 DIAGNOSIS — M25462 Effusion, left knee: Secondary | ICD-10-CM | POA: Diagnosis not present

## 2021-01-16 DIAGNOSIS — M25469 Effusion, unspecified knee: Secondary | ICD-10-CM | POA: Diagnosis not present

## 2021-01-20 ENCOUNTER — Ambulatory Visit (INDEPENDENT_AMBULATORY_CARE_PROVIDER_SITE_OTHER): Payer: Medicare Other

## 2021-01-20 VITALS — Ht 73.0 in | Wt 275.0 lb

## 2021-01-20 DIAGNOSIS — E1151 Type 2 diabetes mellitus with diabetic peripheral angiopathy without gangrene: Secondary | ICD-10-CM

## 2021-01-20 DIAGNOSIS — I10 Essential (primary) hypertension: Secondary | ICD-10-CM

## 2021-01-20 DIAGNOSIS — E782 Mixed hyperlipidemia: Secondary | ICD-10-CM

## 2021-01-20 NOTE — Chronic Care Management (AMB) (Signed)
Chronic Care Management   CCM RN Visit Note  01/20/2021 Name: Steven Hester MRN: [id number] DOB: 1945-09-15  Subjective: Steven Hester is a 75 y.o. year old male who is a primary care patient of Lillard Anes, MD. The care management team was consulted for assistance with disease management and care coordination needs.    Engaged with patient by telephone for initial visit in response to provider referral for case management and/or care coordination services.   Consent to Services:  The patient was given the following information about Chronic Care Management services today, agreed to services, and gave verbal consent: 1. CCM service includes personalized support from designated clinical staff supervised by the primary care provider, including individualized plan of care and coordination with other care providers 2. 24/7 contact phone numbers for assistance for urgent and routine care needs. 3. Service will only be billed when office clinical staff spend 20 minutes or more in a month to coordinate care. 4. Only one practitioner may furnish and bill the service in a calendar month. 5.The patient may stop CCM services at any time (effective at the end of the month) by phone call to the office staff. 6. The patient will be responsible for cost sharing (co-pay) of up to 20% of the service fee (after annual deductible is met). Patient agreed to services and consent obtained.  Patient agreed to services and verbal consent obtained.   Assessment: Review of patient past medical history, allergies, medications, health status, including review of consultants reports, laboratory and other test data, was performed as part of comprehensive evaluation and provision of chronic care management services.   SDOH (Social Determinants of Health) assessments and interventions performed:    CCM Care Plan  Allergies  Allergen Reactions   Ezetimibe Other (See Comments)    Zetia-unknown  reaction    Neopap [Acetaminophen]    Simvastatin Other (See Comments)    Other reaction(s): Myalgias (intolerance)   Niacin Rash    Other reaction(s): Unknown    Outpatient Encounter Medications as of 01/20/2021  Medication Sig   amiodarone (PACERONE) 200 MG tablet Take 100 mg by mouth daily.   atorvastatin (LIPITOR) 40 MG tablet Take 1 tablet (40 mg total) by mouth daily.   carvedilol (COREG) 3.125 MG tablet Take 3.125 mg by mouth 2 (two) times daily.   furosemide (LASIX) 40 MG tablet Take 40 mg by mouth 2 (two) times daily.   glucose blood test strip 1 each by Other route 2 (two) times daily. Use as instructed   isosorbide mononitrate (IMDUR) 30 MG 24 hr tablet Take 30 mg by mouth daily.   Sennosides (LAXATIVE) 25 MG TABS Take by mouth.   warfarin (COUMADIN) 2.5 MG tablet Take 2.5 mg by mouth. MWF   warfarin (COUMADIN) 5 MG tablet Take 5 mg by mouth. Tues, Thursday, Sat and Sun   nitroGLYCERIN (NITROSTAT) 0.4 MG SL tablet Place under the tongue.   No facility-administered encounter medications on file as of 01/20/2021.    Patient Active Problem List   Diagnosis Date Noted   Acquired thrombophilia ([place]) 12/29/2020   Pleural effusion 12/14/2020   Mitral regurgitation 12/02/2020   Warfarin anticoagulation 11/19/2020   HFrEF (heart failure with reduced ejection fraction) ([place]) 10/20/2020   Mild protein-calorie malnutrition ([place]) 09/07/2020   CKD (chronic kidney disease) stage 5, GFR less than 15 ml/min ([place]) 09/01/2020   Acute respiratory failure ([place]) 08/28/2020   Calculus of gallbladder with other cholecystitis without obstruction 08/28/2020   Obstructive  sleep apnea 08/28/2020   Presence of cardiac pacemaker 08/25/2020   Acute kidney failure, unspecified ([place]) 08/17/2020   Allergy, unspecified, initial encounter 08/17/2020   Anemia, unspecified 08/17/2020   Angina pectoris, unspecified ([place]) 08/17/2020   Iron deficiency anemia, unspecified 08/17/2020   Secondary  hyperparathyroidism of renal origin ([place]) 08/17/2020   Acute embolism and thrombosis of unspecified deep veins of unspecified lower extremity ([place]) 07/21/2020   Cardiomyopathy, unspecified ([place]) 07/21/2020   Other complete intestinal obstruction ([place]) 07/21/2020   Perforation of intestine (nontraumatic) ([place]) 07/21/2020   Routine general medical examination at a health care facility 05/04/2020   BMI 36.0-36.9,adult 12/29/2019   Morbid obesity ([place]) 12/29/2019   Other primary thrombocytopenia ([place]) 08/27/2019   Diabetes mellitus type 2 with peripheral artery disease ([place])    Mixed hyperlipidemia    Essential hypertension    Chronic atrial fibrillation (HCC)    Hypokalemia 08/22/2019   Bradycardia 05/16/2019   Malaise and fatigue 05/16/2019   Chronic hypoxemic respiratory failure ([place]) 02/17/2019   SOB (shortness of breath) 02/17/2019   Current use of long term anticoagulation 06/27/2017   Swelling of both ankles 06/27/2017   CAD in native artery 12/27/2016   H/O heart artery stent 12/27/2016   RBBB (right bundle branch block with left anterior fascicular block) 12/27/2016    Conditions to be addressed/monitored:HTN, HLD, DMII, and falls  Care Plan : Diabetes Type 2 (Adult)  Updates made by Thana Ates, RN since 01/20/2021 12:00 AM     Problem: Disease Progression (Diabetes, Type 2)   Priority: Medium  Onset Date: 01/20/2021     Long-Range Goal: Disease Progression Prevented or Minimized   Start Date: 01/20/2021  This Visit's Progress: On track  Priority: Medium  Note:   Objective:  Lab Results  Component Value Date   HGBA1C 6.1 (H) 12/29/2020   Lab Results  Component Value Date   CREATININE 3.21 (H) 12/29/2020   CREATININE 2.46 (H) 12/14/2020   CREATININE 2.34 (H) 10/20/2020   Lab Results  Component Value Date   EGFR 19 (L) 12/29/2020  Current Barriers:  Patient with Dm type 2.  Currently self monitoring daily with CBG of 90-100. Reports diet controlled.  No  current medications.   Case Manager Clinical Goal(s):  patient will demonstrate improved adherence to prescribed treatment plan for diabetes self care/management as evidenced by: daily monitoring and recording of CBG  contacting provider for new or worsened symptoms or questions Interventions:  Collaboration with Lillard Anes, MD regarding development and update of comprehensive plan of care as evidenced by provider attestation and co-signature Inter-disciplinary care team collaboration (see longitudinal plan of care) Encouraged patient to continue to self monitor and follow his DM diet.  Encouraged exercise  Encouraged fall precautions Reviewed with patient when to call MD for worsening condition of his knee. Self-Care Activities - Attends all scheduled provider appointments Adheres to prescribed ADA/carb modified Patient Goals: - check blood sugar daily - take the blood sugar log to all doctor visits - take the blood sugar meter to all doctor visits  -continue to follow my diet  Follow Up Plan: Telephone follow up appointment with care management team member scheduled for:04/21/2021 at 91 am.     Care Plan : Hypertension (Adult)  Updates made by Thana Ates, RN since 01/20/2021 12:00 AM     Problem: Disease Progression (Hypertension)   Priority: Medium  Onset Date: 01/20/2021  Note:   Objective:  Last practice recorded BP readings:  BP Readings  from Last 3 Encounters:  12/29/20 100/60  12/21/20 110/60  12/14/20 122/70   Most recent eGFR/CrCl:  Lab Results  Component Value Date   EGFR 19 (L) 12/29/2020    No components found for: CRCL Current Barriers:  Patient with hypertension:  currently taking medications as prescribed.  Following low salt diet.  Last OV noted BP is good.  Does not self monitor and has not been told to.   Case Manager Clinical Goal(s):  patient will verbalize understanding of plan for hypertension management Interventions:   Collaboration with Lillard Anes, MD regarding development and update of comprehensive plan of care as evidenced by provider attestation and co-signature Inter-disciplinary care team collaboration (see longitudinal plan of care) Evaluation of current treatment plan related to hypertension self management and patient's adherence to plan as established by provider. Provided education to patient re: stroke prevention, s/s of heart attack and stroke, DASH diet, complications of uncontrolled blood pressure Reviewed medications with patient and discussed importance of compliance Discussed plans with patient for ongoing care management follow up and provided patient with direct contact information for care management team Reviewed scheduled/upcoming provider appointments  Self-Care Activities: - Self administers medications as prescribed -Attends all scheduled provider appointments -Calls provider office for new concerns, questions, or BP outside discussed parameters -Follows a low sodium diet/DASH diet Patient Goals: - continue to take my medications as prescribed - continue to follow low salt diet -continue to follow up with MD as scheduled  Follow Up Plan: Telephone follow up appointment with care management team member scheduled for:  04/21/2021 at 10 am      Plan:Telephone follow up appointment with care management team member scheduled for:  04/21/2021 at 10 am.  Tomasa Rand RN, BSN, CEN RN Case Manager - [facility] Mobile: [phone]

## 2021-01-20 NOTE — Patient Instructions (Signed)
Visit Information  PATIENT GOALS:  Goals Addressed             This Visit's Progress    Lifestyle Change-Hypertension   On track    Timeframe:  Long-Range Goal Priority:  Medium Start Date:        01/20/2021                     Expected End Date:        01/20/2022               Follow Up Date 04/21/2021  - continue to take my medications as prescribed - continue to follow low salt diet -continue to follow up with MD as scheduled     Why is this important?   The changes that you are asked to make may be hard to do.  This is especially true when the changes are life-long.  Knowing why it is important to you is the first step.  Working on the change with your family or support person helps you not feel alone.  Reward yourself and family or support person when goals are met. This can be an activity you choose like bowling, hiking, biking, swimming or shooting hoops.          Monitor and Manage My Blood Sugar-Diabetes Type 2   On track    Timeframe:  Long-Range Goal Priority:  Medium Start Date:        01/20/2021                     Expected End Date:      01/20/2022                 Follow Up Date 04/21/2021   - check blood sugar daily - take the blood sugar log to all doctor visits - take the blood sugar meter to all doctor visits  -continue to follow my diet   Why is this important?   Checking your blood sugar at home helps to keep it from getting very high or very low.  Writing the results in a diary or log helps the doctor know how to care for you.  Your blood sugar log should have the time, date and the results.  Also, write down the amount of insulin or other medicine that you take.  Other information, like what you ate, exercise done and how you were feeling, will also be helpful.            The patient verbalized understanding of instructions, educational materials, and care plan provided today and agreed to receive a mailed copy of patient instructions,  educational materials, and care plan.   Telephone follow up appointment with care management team member scheduled for:  04/21/2021 at 10 am  Tomasa Rand RN, BSN, CEN RN Case Manager - [employer] Mobile: [phone]

## 2021-01-25 DIAGNOSIS — Z7901 Long term (current) use of anticoagulants: Secondary | ICD-10-CM | POA: Diagnosis not present

## 2021-02-02 DIAGNOSIS — I499 Cardiac arrhythmia, unspecified: Secondary | ICD-10-CM | POA: Diagnosis not present

## 2021-02-02 DIAGNOSIS — I480 Paroxysmal atrial fibrillation: Secondary | ICD-10-CM | POA: Diagnosis not present

## 2021-02-03 DIAGNOSIS — I480 Paroxysmal atrial fibrillation: Secondary | ICD-10-CM | POA: Diagnosis not present

## 2021-02-03 DIAGNOSIS — I499 Cardiac arrhythmia, unspecified: Secondary | ICD-10-CM | POA: Diagnosis not present

## 2021-02-03 DIAGNOSIS — J9611 Chronic respiratory failure with hypoxia: Secondary | ICD-10-CM | POA: Diagnosis not present

## 2021-02-04 DIAGNOSIS — E1151 Type 2 diabetes mellitus with diabetic peripheral angiopathy without gangrene: Secondary | ICD-10-CM | POA: Diagnosis not present

## 2021-02-04 DIAGNOSIS — E782 Mixed hyperlipidemia: Secondary | ICD-10-CM

## 2021-02-04 DIAGNOSIS — J9611 Chronic respiratory failure with hypoxia: Secondary | ICD-10-CM | POA: Diagnosis not present

## 2021-02-04 DIAGNOSIS — I10 Essential (primary) hypertension: Secondary | ICD-10-CM

## 2021-02-08 DIAGNOSIS — I480 Paroxysmal atrial fibrillation: Secondary | ICD-10-CM | POA: Diagnosis not present

## 2021-02-08 DIAGNOSIS — Z7901 Long term (current) use of anticoagulants: Secondary | ICD-10-CM | POA: Diagnosis not present

## 2021-02-09 ENCOUNTER — Encounter: Payer: Self-pay | Admitting: Legal Medicine

## 2021-02-09 ENCOUNTER — Ambulatory Visit (INDEPENDENT_AMBULATORY_CARE_PROVIDER_SITE_OTHER): Payer: Medicare Other

## 2021-02-09 ENCOUNTER — Other Ambulatory Visit: Payer: Self-pay

## 2021-02-09 DIAGNOSIS — Z23 Encounter for immunization: Secondary | ICD-10-CM | POA: Diagnosis not present

## 2021-02-09 DIAGNOSIS — I255 Ischemic cardiomyopathy: Secondary | ICD-10-CM | POA: Diagnosis not present

## 2021-02-09 DIAGNOSIS — I340 Nonrheumatic mitral (valve) insufficiency: Secondary | ICD-10-CM | POA: Diagnosis not present

## 2021-02-09 DIAGNOSIS — I420 Dilated cardiomyopathy: Secondary | ICD-10-CM | POA: Diagnosis not present

## 2021-02-14 ENCOUNTER — Other Ambulatory Visit: Payer: Self-pay | Admitting: Legal Medicine

## 2021-02-14 DIAGNOSIS — E1169 Type 2 diabetes mellitus with other specified complication: Secondary | ICD-10-CM

## 2021-02-14 NOTE — Telephone Encounter (Signed)
Refill sent to pharmacy.   

## 2021-02-15 DIAGNOSIS — I4819 Other persistent atrial fibrillation: Secondary | ICD-10-CM | POA: Diagnosis not present

## 2021-02-16 DIAGNOSIS — Z7901 Long term (current) use of anticoagulants: Secondary | ICD-10-CM | POA: Diagnosis not present

## 2021-02-16 DIAGNOSIS — I452 Bifascicular block: Secondary | ICD-10-CM | POA: Diagnosis not present

## 2021-02-16 DIAGNOSIS — I340 Nonrheumatic mitral (valve) insufficiency: Secondary | ICD-10-CM | POA: Diagnosis not present

## 2021-02-16 DIAGNOSIS — Z955 Presence of coronary angioplasty implant and graft: Secondary | ICD-10-CM | POA: Diagnosis not present

## 2021-02-16 DIAGNOSIS — I2510 Atherosclerotic heart disease of native coronary artery without angina pectoris: Secondary | ICD-10-CM | POA: Diagnosis not present

## 2021-02-16 DIAGNOSIS — I10 Essential (primary) hypertension: Secondary | ICD-10-CM | POA: Diagnosis not present

## 2021-02-16 DIAGNOSIS — I4819 Other persistent atrial fibrillation: Secondary | ICD-10-CM | POA: Diagnosis not present

## 2021-02-16 DIAGNOSIS — I420 Dilated cardiomyopathy: Secondary | ICD-10-CM | POA: Diagnosis not present

## 2021-02-16 DIAGNOSIS — Z45018 Encounter for adjustment and management of other part of cardiac pacemaker: Secondary | ICD-10-CM | POA: Diagnosis not present

## 2021-02-16 DIAGNOSIS — I255 Ischemic cardiomyopathy: Secondary | ICD-10-CM | POA: Diagnosis not present

## 2021-02-16 DIAGNOSIS — I252 Old myocardial infarction: Secondary | ICD-10-CM | POA: Diagnosis not present

## 2021-02-16 DIAGNOSIS — E785 Hyperlipidemia, unspecified: Secondary | ICD-10-CM | POA: Diagnosis not present

## 2021-02-22 DIAGNOSIS — I4819 Other persistent atrial fibrillation: Secondary | ICD-10-CM | POA: Diagnosis not present

## 2021-02-23 DIAGNOSIS — G4733 Obstructive sleep apnea (adult) (pediatric): Secondary | ICD-10-CM | POA: Diagnosis not present

## 2021-02-24 DIAGNOSIS — Z87891 Personal history of nicotine dependence: Secondary | ICD-10-CM | POA: Diagnosis not present

## 2021-02-24 DIAGNOSIS — E1122 Type 2 diabetes mellitus with diabetic chronic kidney disease: Secondary | ICD-10-CM | POA: Diagnosis not present

## 2021-02-24 DIAGNOSIS — Z45018 Encounter for adjustment and management of other part of cardiac pacemaker: Secondary | ICD-10-CM | POA: Diagnosis not present

## 2021-02-24 DIAGNOSIS — Z7901 Long term (current) use of anticoagulants: Secondary | ICD-10-CM | POA: Diagnosis not present

## 2021-02-24 DIAGNOSIS — Z79899 Other long term (current) drug therapy: Secondary | ICD-10-CM | POA: Diagnosis not present

## 2021-02-24 DIAGNOSIS — I2510 Atherosclerotic heart disease of native coronary artery without angina pectoris: Secondary | ICD-10-CM | POA: Diagnosis not present

## 2021-02-24 DIAGNOSIS — N1832 Chronic kidney disease, stage 3b: Secondary | ICD-10-CM | POA: Diagnosis not present

## 2021-02-24 DIAGNOSIS — G4733 Obstructive sleep apnea (adult) (pediatric): Secondary | ICD-10-CM | POA: Diagnosis not present

## 2021-02-24 DIAGNOSIS — E785 Hyperlipidemia, unspecified: Secondary | ICD-10-CM | POA: Diagnosis not present

## 2021-02-24 DIAGNOSIS — I4891 Unspecified atrial fibrillation: Secondary | ICD-10-CM | POA: Diagnosis not present

## 2021-02-24 DIAGNOSIS — I4819 Other persistent atrial fibrillation: Secondary | ICD-10-CM | POA: Diagnosis not present

## 2021-02-25 DIAGNOSIS — I499 Cardiac arrhythmia, unspecified: Secondary | ICD-10-CM | POA: Diagnosis not present

## 2021-03-05 DIAGNOSIS — J9611 Chronic respiratory failure with hypoxia: Secondary | ICD-10-CM | POA: Diagnosis not present

## 2021-03-07 DIAGNOSIS — J9611 Chronic respiratory failure with hypoxia: Secondary | ICD-10-CM | POA: Diagnosis not present

## 2021-03-08 DIAGNOSIS — R002 Palpitations: Secondary | ICD-10-CM | POA: Diagnosis not present

## 2021-03-08 DIAGNOSIS — I4819 Other persistent atrial fibrillation: Secondary | ICD-10-CM | POA: Diagnosis not present

## 2021-03-21 DIAGNOSIS — I4819 Other persistent atrial fibrillation: Secondary | ICD-10-CM | POA: Diagnosis not present

## 2021-04-05 DIAGNOSIS — J9611 Chronic respiratory failure with hypoxia: Secondary | ICD-10-CM | POA: Diagnosis not present

## 2021-04-06 DIAGNOSIS — J9611 Chronic respiratory failure with hypoxia: Secondary | ICD-10-CM | POA: Diagnosis not present

## 2021-04-11 DIAGNOSIS — I255 Ischemic cardiomyopathy: Secondary | ICD-10-CM | POA: Diagnosis not present

## 2021-04-11 DIAGNOSIS — I480 Paroxysmal atrial fibrillation: Secondary | ICD-10-CM | POA: Diagnosis not present

## 2021-04-11 DIAGNOSIS — I252 Old myocardial infarction: Secondary | ICD-10-CM | POA: Diagnosis not present

## 2021-04-11 DIAGNOSIS — I4819 Other persistent atrial fibrillation: Secondary | ICD-10-CM | POA: Diagnosis not present

## 2021-04-11 DIAGNOSIS — I2510 Atherosclerotic heart disease of native coronary artery without angina pectoris: Secondary | ICD-10-CM | POA: Diagnosis not present

## 2021-04-11 DIAGNOSIS — I10 Essential (primary) hypertension: Secondary | ICD-10-CM | POA: Diagnosis not present

## 2021-04-11 DIAGNOSIS — Z45018 Encounter for adjustment and management of other part of cardiac pacemaker: Secondary | ICD-10-CM | POA: Diagnosis not present

## 2021-04-11 DIAGNOSIS — I452 Bifascicular block: Secondary | ICD-10-CM | POA: Diagnosis not present

## 2021-04-11 DIAGNOSIS — R0602 Shortness of breath: Secondary | ICD-10-CM | POA: Diagnosis not present

## 2021-04-11 DIAGNOSIS — E785 Hyperlipidemia, unspecified: Secondary | ICD-10-CM | POA: Diagnosis not present

## 2021-04-11 DIAGNOSIS — Z955 Presence of coronary angioplasty implant and graft: Secondary | ICD-10-CM | POA: Diagnosis not present

## 2021-04-11 DIAGNOSIS — Z7901 Long term (current) use of anticoagulants: Secondary | ICD-10-CM | POA: Diagnosis not present

## 2021-04-11 DIAGNOSIS — I340 Nonrheumatic mitral (valve) insufficiency: Secondary | ICD-10-CM | POA: Diagnosis not present

## 2021-04-11 DIAGNOSIS — I420 Dilated cardiomyopathy: Secondary | ICD-10-CM | POA: Diagnosis not present

## 2021-04-13 ENCOUNTER — Telehealth: Payer: Self-pay | Admitting: *Deleted

## 2021-04-13 DIAGNOSIS — I4891 Unspecified atrial fibrillation: Secondary | ICD-10-CM | POA: Diagnosis not present

## 2021-04-13 DIAGNOSIS — Z950 Presence of cardiac pacemaker: Secondary | ICD-10-CM | POA: Diagnosis not present

## 2021-04-13 NOTE — Chronic Care Management (AMB) (Signed)
  Care Management   Note  04/13/2021 Name: NUMA SCHROETER MRN: [id number] DOB: 11-Jun-1945  Lillard Anes Mcdowell is a 75 y.o. year old male who is a primary care patient of Lillard Anes, MD and is actively engaged with the care management team. I reached out to [name] by phone today to assist with re-scheduling a follow up visit with the RN Case Manager  Follow up plan: Unsuccessful telephone outreach attempt made. A HIPAA compliant phone message was left for the patient providing contact information and requesting a return call.  The care management team will reach out to the patient again over the next 7 days.  If patient returns call to provider office, please advise to call [place] at [place]  Direct Dial: [phone]

## 2021-04-14 NOTE — Chronic Care Management (AMB) (Signed)
  Care Management   Note  04/14/2021 Name: MONICA ZAHLER MRN: [id number] DOB: Sep 11, 1945  Lillard Anes Detamore is a 75 y.o. year old male who is a primary care patient of Lillard Anes, MD and is actively engaged with the care management team. I reached out to [name] by phone today to assist with re-scheduling a follow up visit with the RN Case Manager  Follow up plan: Telephone appointment with care management team member scheduled for:05/03/21   [facility]  Direct Dial: [phone]

## 2021-04-19 DIAGNOSIS — N184 Chronic kidney disease, stage 4 (severe): Secondary | ICD-10-CM | POA: Diagnosis not present

## 2021-04-19 DIAGNOSIS — I480 Paroxysmal atrial fibrillation: Secondary | ICD-10-CM | POA: Diagnosis not present

## 2021-04-19 DIAGNOSIS — I4819 Other persistent atrial fibrillation: Secondary | ICD-10-CM | POA: Diagnosis not present

## 2021-04-19 DIAGNOSIS — I129 Hypertensive chronic kidney disease with stage 1 through stage 4 chronic kidney disease, or unspecified chronic kidney disease: Secondary | ICD-10-CM | POA: Diagnosis not present

## 2021-04-19 DIAGNOSIS — D649 Anemia, unspecified: Secondary | ICD-10-CM | POA: Diagnosis not present

## 2021-04-19 DIAGNOSIS — E1122 Type 2 diabetes mellitus with diabetic chronic kidney disease: Secondary | ICD-10-CM | POA: Diagnosis not present

## 2021-04-19 DIAGNOSIS — E872 Acidosis, unspecified: Secondary | ICD-10-CM | POA: Diagnosis not present

## 2021-04-20 DIAGNOSIS — I255 Ischemic cardiomyopathy: Secondary | ICD-10-CM | POA: Diagnosis not present

## 2021-04-20 DIAGNOSIS — I340 Nonrheumatic mitral (valve) insufficiency: Secondary | ICD-10-CM | POA: Diagnosis not present

## 2021-04-20 DIAGNOSIS — I4819 Other persistent atrial fibrillation: Secondary | ICD-10-CM | POA: Diagnosis not present

## 2021-04-20 DIAGNOSIS — I252 Old myocardial infarction: Secondary | ICD-10-CM | POA: Diagnosis not present

## 2021-04-20 DIAGNOSIS — I420 Dilated cardiomyopathy: Secondary | ICD-10-CM | POA: Diagnosis not present

## 2021-04-20 DIAGNOSIS — E119 Type 2 diabetes mellitus without complications: Secondary | ICD-10-CM | POA: Diagnosis not present

## 2021-04-20 DIAGNOSIS — I10 Essential (primary) hypertension: Secondary | ICD-10-CM | POA: Diagnosis not present

## 2021-04-21 ENCOUNTER — Telehealth: Payer: Medicare Other

## 2021-04-30 ENCOUNTER — Encounter (HOSPITAL_COMMUNITY): Payer: Self-pay

## 2021-04-30 ENCOUNTER — Other Ambulatory Visit: Payer: Self-pay | Admitting: Legal Medicine

## 2021-04-30 ENCOUNTER — Observation Stay (HOSPITAL_COMMUNITY)
Admission: EM | Admit: 2021-04-30 | Discharge: 2021-05-02 | Disposition: A | Discharge status: Home / Self Care | Payer: Medicare Other | Attending: Family Medicine | Admitting: Family Medicine

## 2021-04-30 DIAGNOSIS — R778 Other specified abnormalities of plasma proteins: Secondary | ICD-10-CM | POA: Diagnosis not present

## 2021-04-30 DIAGNOSIS — R7989 Other specified abnormal findings of blood chemistry: Secondary | ICD-10-CM

## 2021-04-30 DIAGNOSIS — D6869 Other thrombophilia: Secondary | ICD-10-CM | POA: Diagnosis present

## 2021-04-30 DIAGNOSIS — I4891 Unspecified atrial fibrillation: Secondary | ICD-10-CM | POA: Insufficient documentation

## 2021-04-30 DIAGNOSIS — E782 Mixed hyperlipidemia: Secondary | ICD-10-CM

## 2021-04-30 DIAGNOSIS — I2510 Atherosclerotic heart disease of native coronary artery without angina pectoris: Secondary | ICD-10-CM | POA: Insufficient documentation

## 2021-04-30 DIAGNOSIS — Z20822 Contact with and (suspected) exposure to covid-19: Secondary | ICD-10-CM | POA: Diagnosis not present

## 2021-04-30 DIAGNOSIS — J9811 Atelectasis: Secondary | ICD-10-CM | POA: Diagnosis not present

## 2021-04-30 DIAGNOSIS — Z950 Presence of cardiac pacemaker: Secondary | ICD-10-CM | POA: Diagnosis present

## 2021-04-30 DIAGNOSIS — R0602 Shortness of breath: Secondary | ICD-10-CM | POA: Diagnosis not present

## 2021-04-30 DIAGNOSIS — Z743 Need for continuous supervision: Secondary | ICD-10-CM | POA: Diagnosis not present

## 2021-04-30 DIAGNOSIS — J9601 Acute respiratory failure with hypoxia: Secondary | ICD-10-CM | POA: Diagnosis present

## 2021-04-30 DIAGNOSIS — J9611 Chronic respiratory failure with hypoxia: Secondary | ICD-10-CM | POA: Diagnosis not present

## 2021-04-30 DIAGNOSIS — E876 Hypokalemia: Secondary | ICD-10-CM

## 2021-04-30 DIAGNOSIS — N184 Chronic kidney disease, stage 4 (severe): Secondary | ICD-10-CM | POA: Diagnosis not present

## 2021-04-30 DIAGNOSIS — N289 Disorder of kidney and ureter, unspecified: Secondary | ICD-10-CM | POA: Diagnosis not present

## 2021-04-30 DIAGNOSIS — I5023 Acute on chronic systolic (congestive) heart failure: Principal | ICD-10-CM

## 2021-04-30 DIAGNOSIS — I130 Hypertensive heart and chronic kidney disease with heart failure and stage 1 through stage 4 chronic kidney disease, or unspecified chronic kidney disease: Secondary | ICD-10-CM

## 2021-04-30 DIAGNOSIS — I5022 Chronic systolic (congestive) heart failure: Secondary | ICD-10-CM | POA: Diagnosis present

## 2021-04-30 DIAGNOSIS — Z79899 Other long term (current) drug therapy: Secondary | ICD-10-CM | POA: Diagnosis not present

## 2021-04-30 DIAGNOSIS — Z87891 Personal history of nicotine dependence: Secondary | ICD-10-CM | POA: Diagnosis not present

## 2021-04-30 DIAGNOSIS — Z7901 Long term (current) use of anticoagulants: Secondary | ICD-10-CM

## 2021-04-30 DIAGNOSIS — I4819 Other persistent atrial fibrillation: Secondary | ICD-10-CM | POA: Diagnosis present

## 2021-04-30 DIAGNOSIS — R0689 Other abnormalities of breathing: Secondary | ICD-10-CM | POA: Diagnosis not present

## 2021-04-30 DIAGNOSIS — D631 Anemia in chronic kidney disease: Secondary | ICD-10-CM | POA: Diagnosis present

## 2021-04-30 DIAGNOSIS — R609 Edema, unspecified: Secondary | ICD-10-CM | POA: Diagnosis not present

## 2021-04-30 DIAGNOSIS — I509 Heart failure, unspecified: Secondary | ICD-10-CM | POA: Diagnosis not present

## 2021-04-30 DIAGNOSIS — I340 Nonrheumatic mitral (valve) insufficiency: Secondary | ICD-10-CM | POA: Diagnosis present

## 2021-04-30 HISTORY — DX: Chronic kidney disease, stage 4 (severe): N18.4

## 2021-04-30 HISTORY — DX: Type 2 diabetes mellitus without complications: E11.9

## 2021-04-30 HISTORY — DX: Sick sinus syndrome: I49.5

## 2021-04-30 HISTORY — DX: Ischemic cardiomyopathy: I25.5

## 2021-04-30 HISTORY — DX: Other persistent atrial fibrillation: I48.19

## 2021-04-30 HISTORY — DX: Nonrheumatic mitral (valve) insufficiency: I34.0

## 2021-04-30 HISTORY — DX: ST elevation (STEMI) myocardial infarction involving other coronary artery of inferior wall: I21.19

## 2021-04-30 HISTORY — DX: Presence of cardiac pacemaker: Z95.0

## 2021-04-30 NOTE — ED Triage Notes (Signed)
Pt BIB [facility]. Pt has hx of a.fib and CHF, since yesterday pt has experienced increased SHOB. Pt has noticed some swelling in his abd and has gained 2lbs in 1 day. Pt is taking all medications as prescribed. Pt is not on O2 at home but has it as needed. On RA in triage pt is 88%, placed on 2L [place]. Pt is A&Ox4.

## 2021-05-01 ENCOUNTER — Inpatient Hospital Stay (HOSPITAL_BASED_OUTPATIENT_CLINIC_OR_DEPARTMENT_OTHER): Payer: Medicare Other

## 2021-05-01 ENCOUNTER — Encounter (HOSPITAL_COMMUNITY): Payer: Self-pay | Admitting: Internal Medicine

## 2021-05-01 ENCOUNTER — Emergency Department (HOSPITAL_COMMUNITY): Payer: Medicare Other

## 2021-05-01 ENCOUNTER — Other Ambulatory Visit: Payer: Self-pay

## 2021-05-01 DIAGNOSIS — Z7901 Long term (current) use of anticoagulants: Secondary | ICD-10-CM

## 2021-05-01 DIAGNOSIS — J9811 Atelectasis: Secondary | ICD-10-CM | POA: Diagnosis not present

## 2021-05-01 DIAGNOSIS — I5023 Acute on chronic systolic (congestive) heart failure: Secondary | ICD-10-CM

## 2021-05-01 DIAGNOSIS — E876 Hypokalemia: Secondary | ICD-10-CM | POA: Diagnosis present

## 2021-05-01 DIAGNOSIS — I509 Heart failure, unspecified: Secondary | ICD-10-CM | POA: Diagnosis not present

## 2021-05-01 DIAGNOSIS — I482 Chronic atrial fibrillation, unspecified: Secondary | ICD-10-CM | POA: Diagnosis not present

## 2021-05-01 DIAGNOSIS — D6869 Other thrombophilia: Secondary | ICD-10-CM

## 2021-05-01 DIAGNOSIS — I130 Hypertensive heart and chronic kidney disease with heart failure and stage 1 through stage 4 chronic kidney disease, or unspecified chronic kidney disease: Secondary | ICD-10-CM | POA: Diagnosis not present

## 2021-05-01 DIAGNOSIS — N184 Chronic kidney disease, stage 4 (severe): Secondary | ICD-10-CM | POA: Diagnosis not present

## 2021-05-01 DIAGNOSIS — I4819 Other persistent atrial fibrillation: Secondary | ICD-10-CM

## 2021-05-01 DIAGNOSIS — R778 Other specified abnormalities of plasma proteins: Secondary | ICD-10-CM | POA: Diagnosis not present

## 2021-05-01 DIAGNOSIS — J9601 Acute respiratory failure with hypoxia: Secondary | ICD-10-CM | POA: Diagnosis not present

## 2021-05-01 DIAGNOSIS — I340 Nonrheumatic mitral (valve) insufficiency: Secondary | ICD-10-CM | POA: Diagnosis not present

## 2021-05-01 DIAGNOSIS — I5022 Chronic systolic (congestive) heart failure: Secondary | ICD-10-CM | POA: Diagnosis present

## 2021-05-01 DIAGNOSIS — R0602 Shortness of breath: Secondary | ICD-10-CM | POA: Diagnosis not present

## 2021-05-01 DIAGNOSIS — N289 Disorder of kidney and ureter, unspecified: Secondary | ICD-10-CM | POA: Diagnosis not present

## 2021-05-01 DIAGNOSIS — Z950 Presence of cardiac pacemaker: Secondary | ICD-10-CM

## 2021-05-01 DIAGNOSIS — I5020 Unspecified systolic (congestive) heart failure: Secondary | ICD-10-CM

## 2021-05-01 LAB — CBC WITH DIFFERENTIAL/PLATELET
Abs Immature Granulocytes: 0.03 10*3/uL (ref 0.00–0.07)
Basophils Absolute: 0 10*3/uL (ref 0.0–0.1)
Basophils Relative: 1 %
Eosinophils Absolute: 0 10*3/uL (ref 0.0–0.5)
Eosinophils Relative: 0 %
HCT: 35.1 % — ABNORMAL LOW (ref 39.0–52.0)
Hemoglobin: 10.9 g/dL — ABNORMAL LOW (ref 13.0–17.0)
Immature Granulocytes: 0 %
Lymphocytes Relative: 16 %
Lymphs Abs: 1.2 10*3/uL (ref 0.7–4.0)
MCH: 28 pg (ref 26.0–34.0)
MCHC: 31.1 g/dL (ref 30.0–36.0)
MCV: 90.2 fL (ref 80.0–100.0)
Monocytes Absolute: 0.6 10*3/uL (ref 0.1–1.0)
Monocytes Relative: 8 %
Neutro Abs: 5.6 10*3/uL (ref 1.7–7.7)
Neutrophils Relative %: 75 %
Platelets: 133 10*3/uL — ABNORMAL LOW (ref 150–400)
RBC: 3.89 MIL/uL — ABNORMAL LOW (ref 4.22–5.81)
RDW: 17.4 % — ABNORMAL HIGH (ref 11.5–15.5)
WBC: 7.4 10*3/uL (ref 4.0–10.5)
nRBC: 0 % (ref 0.0–0.2)

## 2021-05-01 LAB — ECHOCARDIOGRAM COMPLETE
AV Vena cont: 0.2 cm
Calc EF: 41.8 %
Height: 73 in
MV M vel: 4.48 m/s
MV Peak grad: 80.1 mmHg
P 1/2 time: 591 msec
Radius: 0.5 cm
S' Lateral: 5.8 cm
Single Plane A2C EF: 44.4 %
Single Plane A4C EF: 35 %
Weight: 4512 oz

## 2021-05-01 LAB — BASIC METABOLIC PANEL
Anion gap: 9 (ref 5–15)
BUN: 43 mg/dL — ABNORMAL HIGH (ref 8–23)
CO2: 29 mmol/L (ref 22–32)
Calcium: 8.5 mg/dL — ABNORMAL LOW (ref 8.9–10.3)
Chloride: 101 mmol/L (ref 98–111)
Creatinine, Ser: 2.43 mg/dL — ABNORMAL HIGH (ref 0.61–1.24)
GFR, Estimated: 27 mL/min — ABNORMAL LOW (ref >60)
Glucose, Bld: 119 mg/dL — ABNORMAL HIGH (ref 70–99)
Potassium: 3 mmol/L — ABNORMAL LOW (ref 3.5–5.1)
Sodium: 139 mmol/L (ref 135–145)

## 2021-05-01 LAB — URINALYSIS, ROUTINE W REFLEX MICROSCOPIC
Bilirubin Urine: NEGATIVE
Glucose, UA: NEGATIVE mg/dL
Hgb urine dipstick: NEGATIVE
Ketones, ur: NEGATIVE mg/dL
Leukocytes,Ua: NEGATIVE
Nitrite: NEGATIVE
Specific Gravity, Urine: 1.02 (ref 1.005–1.030)
pH: 6 (ref 5.0–8.0)

## 2021-05-01 LAB — RESP PANEL BY RT-PCR (FLU A&B, COVID) ARPGX2
Influenza A by PCR: NEGATIVE
Influenza B by PCR: NEGATIVE
SARS Coronavirus 2 by RT PCR: NEGATIVE

## 2021-05-01 LAB — PROTIME-INR
INR: 4.5 (ref 0.8–1.2)
Prothrombin Time: 43 seconds — ABNORMAL HIGH (ref 11.4–15.2)

## 2021-05-01 LAB — BRAIN NATRIURETIC PEPTIDE: B Natriuretic Peptide: 1036.6 pg/mL — ABNORMAL HIGH (ref 0.0–100.0)

## 2021-05-01 LAB — TROPONIN I (HIGH SENSITIVITY): Troponin I (High Sensitivity): 29 ng/L — ABNORMAL HIGH (ref <18)

## 2021-05-01 MED ORDER — RANOLAZINE ER 500 MG PO TB12
500.0000 mg | ORAL_TABLET | Freq: Two times a day (BID) | ORAL | Status: DC
  Administered 2021-05-01 – 2021-05-02 (×3): 500 mg via ORAL
  Filled 2021-05-01 (×3): qty 1

## 2021-05-01 MED ORDER — MELATONIN 5 MG PO TABS
5.0000 mg | ORAL_TABLET | Freq: Once | ORAL | Status: AC
  Administered 2021-05-01: 23:00:00 5 mg via ORAL
  Filled 2021-05-01: qty 1

## 2021-05-01 MED ORDER — AMIODARONE HCL 200 MG PO TABS
200.0000 mg | ORAL_TABLET | Freq: Every day | ORAL | Status: DC
  Administered 2021-05-01 – 2021-05-02 (×2): 200 mg via ORAL
  Filled 2021-05-01 (×2): qty 1

## 2021-05-01 MED ORDER — CARVEDILOL 3.125 MG PO TABS
3.1250 mg | ORAL_TABLET | Freq: Two times a day (BID) | ORAL | Status: DC
  Administered 2021-05-01 – 2021-05-02 (×3): 3.125 mg via ORAL
  Filled 2021-05-01 (×3): qty 1

## 2021-05-01 MED ORDER — METOLAZONE 2.5 MG PO TABS
2.5000 mg | ORAL_TABLET | Freq: Two times a day (BID) | ORAL | Status: DC
  Administered 2021-05-01 (×2): 2.5 mg via ORAL
  Filled 2021-05-01 (×3): qty 1

## 2021-05-01 MED ORDER — PERFLUTREN LIPID MICROSPHERE
1.0000 mL | INTRAVENOUS | Status: AC | PRN
Start: 2021-05-01 — End: 2021-05-01
  Administered 2021-05-01: 10:00:00 2 mL via INTRAVENOUS
  Filled 2021-05-01: qty 10

## 2021-05-01 MED ORDER — POTASSIUM CHLORIDE CRYS ER 20 MEQ PO TBCR
40.0000 meq | EXTENDED_RELEASE_TABLET | Freq: Once | ORAL | Status: AC
  Administered 2021-05-01: 04:00:00 40 meq via ORAL
  Filled 2021-05-01: qty 2

## 2021-05-01 MED ORDER — POTASSIUM CHLORIDE CRYS ER 20 MEQ PO TBCR
20.0000 meq | EXTENDED_RELEASE_TABLET | Freq: Every day | ORAL | Status: DC
  Administered 2021-05-01: 10:00:00 20 meq via ORAL
  Filled 2021-05-01: qty 1

## 2021-05-01 MED ORDER — ONDANSETRON HCL 4 MG/2ML IJ SOLN: 4.0000 mg | Freq: Four times a day (QID) | INTRAMUSCULAR | Status: DC | PRN

## 2021-05-01 MED ORDER — SODIUM CHLORIDE 0.9% FLUSH: 3.0000 mL | INTRAVENOUS | Status: DC | PRN

## 2021-05-01 MED ORDER — WARFARIN - PHARMACIST DOSING INPATIENT: Freq: Every day | Status: DC

## 2021-05-01 MED ORDER — SODIUM CHLORIDE 0.9 % IV SOLN: 250.0000 mL | INTRAVENOUS | Status: DC | PRN

## 2021-05-01 MED ORDER — FUROSEMIDE 10 MG/ML IJ SOLN
80.0000 mg | Freq: Two times a day (BID) | INTRAMUSCULAR | Status: DC
  Administered 2021-05-01 – 2021-05-02 (×3): 80 mg via INTRAVENOUS
  Filled 2021-05-01 (×3): qty 8

## 2021-05-01 MED ORDER — FUROSEMIDE 10 MG/ML IJ SOLN
80.0000 mg | Freq: Once | INTRAMUSCULAR | Status: AC
  Administered 2021-05-01: 03:00:00 80 mg via INTRAVENOUS
  Filled 2021-05-01: qty 8

## 2021-05-01 MED ORDER — POTASSIUM CHLORIDE CRYS ER 20 MEQ PO TBCR
40.0000 meq | EXTENDED_RELEASE_TABLET | ORAL | Status: AC
  Administered 2021-05-01 (×2): 40 meq via ORAL
  Filled 2021-05-01 (×2): qty 2

## 2021-05-01 MED ORDER — ACETAMINOPHEN 325 MG PO TABS: 650.0000 mg | ORAL_TABLET | ORAL | Status: DC | PRN

## 2021-05-01 MED ORDER — ATORVASTATIN CALCIUM 40 MG PO TABS
40.0000 mg | ORAL_TABLET | Freq: Every day | ORAL | Status: DC
  Administered 2021-05-01 – 2021-05-02 (×2): 40 mg via ORAL
  Filled 2021-05-01 (×2): qty 1

## 2021-05-01 MED ORDER — SODIUM CHLORIDE 0.9% FLUSH
3.0000 mL | Freq: Two times a day (BID) | INTRAVENOUS | Status: DC
  Administered 2021-05-01 – 2021-05-02 (×3): 3 mL via INTRAVENOUS

## 2021-05-01 NOTE — Subjective & Objective (Addendum)
CC: SOB, weight gain HPI: 75 year old male with a history of chronic systolic heart failure EF of 30%, CKD stage IV with a baseline creatinine approximately 2.5-3.2 (previously on hemodialysis), permanent pacemaker placement, chronic A. fib on Coumadin, severe mitral regurg, morbid obesity who presents to the ER with worsening shortness of breath over the last several months, weight gain.  Patient states that he has been more short of breath last 2 to 3 days.  He has gained about 2 pounds in the last day.  Patient's primary cardiologist is Dr. Otho Perl out at [facility].  Patient states that he was recently placed on Entresto by Dr. Otho Perl.  Patient is taking Lasix at home 40 mg twice daily.  He states that he is not urinating as much. He has noted decreased urine output despite 40 mg po lasix bid this past week. Pt admits to worsening abd bloating and increased abd girth.  Of note, the patient had a bowel obstruction requiring surgery while he was visiting in [place] in the beginning of 2022.  He required admission to the [facility] and went to acute renal failure requiring initiation of hemodialysis.  He was sent to rehab and continued outpatient dialysis.  He was transferred back up to [facility] in the spring 2022 and eventually got off dialysis.  He has remained with chronic kidney disease.  His EF was noted to be about 30% by echo 02-2021. Echo noted to have moderate/severe MR.  Patient had a permanent pacemaker placed in March 2021 for bradycardia and A. fib.  All of his subspecialty cares performed at [facility] with [facility].  His only connection to [facility] health is his PCP.  On arrival to the ER tonight, temperature 98.7 heart rate 77 blood pressure 144/81 with room air saturations of 88%.  Lab evaluation BNP elevated at 1036. Chemistry sodium 139 potassium 3.0 BUN of 43 creatinine 2.43  White count  7.4, hemoglobin 10.9, platelets 133  INR 4.5  COVID and influenza both negative.  Chest x-ray demonstrated pulmonary edema.  Due to pulmonary edema, hypoxic respiratory failure, chronic kidney disease, [facility] hospitalist contacted for admission.  Patient given 80 mg of IV Lasix with minimal urine output.

## 2021-05-01 NOTE — ED Provider Notes (Signed)
[facility] DEPT Provider Note   CSN: [id number] Arrival date & time: 04/30/21  2337     History Chief Complaint  Patient presents with   Shortness of Breath    Steven Hester is a 75 y.o. male.  The history is provided by the patient.  Shortness of Breath He has history of hypertension, diabetes, hyperlipidemia, heart failure with reduced ejection fraction, chronic kidney disease atrial fibrillation anticoagulated on warfarin and comes in because of abdominal swelling and shortness of breath which have been progressing over the last 5 days.  He does admit that he may have been eating things with too much salt.  He has tried increasing his furosemide from 40 mg twice a day to 80 mg in the morning, 40 mg in the afternoon without any improvement.  He has gained 2 pounds since yesterday and thinks he has gained 4 pounds over the last week.  Dyspnea is worse if he lays flat.  He denies chest pain, heaviness, tightness, pressure.   Past Medical History:  Diagnosis Date   Chronic atrial fibrillation ([place])    Essential hypertension    HLD (hyperlipidemia)    Mixed hyperlipidemia    Other primary thrombocytopenia ([place]) 08/27/2019   Type 2 diabetes mellitus with other specified complication [facility])     Patient Active Problem List   Diagnosis Date Noted   Acquired thrombophilia ([place]) 12/29/2020   Pleural effusion 12/14/2020   Mitral regurgitation 12/02/2020   Warfarin anticoagulation 11/19/2020   HFrEF (heart failure with reduced ejection fraction) ([place]) 10/20/2020   Mild protein-calorie malnutrition ([place]) 09/07/2020   CKD (chronic kidney disease) stage 5, GFR less than 15 ml/min (HCC) 09/01/2020   Acute respiratory failure ([place]) 08/28/2020   Calculus of gallbladder with other cholecystitis without obstruction 08/28/2020   Obstructive sleep apnea 08/28/2020   Presence of cardiac pacemaker 08/25/2020   Acute kidney failure, unspecified ([place]) 08/17/2020    Allergy, unspecified, initial encounter 08/17/2020   Anemia, unspecified 08/17/2020   Angina pectoris, unspecified ([place]) 08/17/2020   Iron deficiency anemia, unspecified 08/17/2020   Secondary hyperparathyroidism of renal origin ([place]) 08/17/2020   Acute embolism and thrombosis of unspecified deep veins of unspecified lower extremity ([place]) 07/21/2020   Cardiomyopathy, unspecified ([place]) 07/21/2020   Other complete intestinal obstruction ([place]) 07/21/2020   Perforation of intestine (nontraumatic) ([place]) 07/21/2020   Routine general medical examination at a health care facility 05/04/2020   BMI 36.0-36.9,adult 12/29/2019   Morbid obesity ([place]) 12/29/2019   Other primary thrombocytopenia ([place]) 08/27/2019   Diabetes mellitus type 2 with peripheral artery disease ([place])    Mixed hyperlipidemia    Essential hypertension    Chronic atrial fibrillation (HCC)    Hypokalemia 08/22/2019   Bradycardia 05/16/2019   Malaise and fatigue 05/16/2019   Chronic hypoxemic respiratory failure ([place]) 02/17/2019   SOB (shortness of breath) 02/17/2019   Current use of long term anticoagulation 06/27/2017   Swelling of both ankles 06/27/2017   CAD in native artery 12/27/2016   H/O heart artery stent 12/27/2016   RBBB (right bundle branch block with left anterior fascicular block) 12/27/2016    Past Surgical History:  Procedure Laterality Date   COLON SURGERY     CORONARY ANGIOPLASTY WITH STENT PLACEMENT     PACEMAKER INSERTION         Family History  Problem Relation Age of Onset   Heart attack Mother    GI Disease Father    Cancer Father     Social  History   Tobacco Use   Smoking status: Former    Packs/day: 1.00    Years: 10.00    Pack years: 10.00    Types: Cigarettes   Smokeless tobacco: Never  Substance Use Topics   Alcohol use: Yes    Comment: occaionally    Drug use: Never    Home Medications Prior to Admission medications   Medication Sig Start Date End Date Taking?  Authorizing Provider  ACCU-CHEK GUIDE test strip USE 1 STRIP TO CHECK GLUCOSE TWICE DAILY 02/14/21   Lillard Anes, MD  amiodarone (PACERONE) 200 MG tablet Take 100 mg by mouth daily.    [provider]  atorvastatin (LIPITOR) 40 MG tablet Take 1 tablet (40 mg total) by mouth daily. 10/20/20   Lillard Anes, MD  carvedilol (COREG) 3.125 MG tablet Take 3.125 mg by mouth 2 (two) times daily. 12/02/20   [provider]  furosemide (LASIX) 40 MG tablet Take 40 mg by mouth 2 (two) times daily. 09/28/20   [provider]  isosorbide mononitrate (IMDUR) 30 MG 24 hr tablet Take 30 mg by mouth daily.    [provider]  nitroGLYCERIN (NITROSTAT) 0.4 MG SL tablet Place under the tongue. 10/24/18 12/14/20  [provider]  Sennosides (LAXATIVE) 25 MG TABS Take by mouth.    [provider]  warfarin (COUMADIN) 2.5 MG tablet Take 2.5 mg by mouth. MWF    [provider]  warfarin (COUMADIN) 5 MG tablet Take 5 mg by mouth. Tues, Thursday, Sat and Sun    [provider]    Allergies    Ezetimibe, Neopap [acetaminophen], Simvastatin, and Niacin  Review of Systems   Review of Systems  Respiratory:  Positive for shortness of breath.   All other systems reviewed and are negative.  Physical Exam Updated Vital Signs BP (!) 144/81 (BP Location: Left Arm)    Pulse 77    Temp 98.7 F (37.1 C) (Oral)    Resp 16    Ht 6\' 1"  (1.854 m)    Wt 127.9 kg    SpO2 (!) 88%    BMI 37.21 kg/m   Physical Exam Vitals and nursing note reviewed.  75 year old male, resting comfortably and in no acute distress. Vital signs are significant for borderline elevated blood pressure. Oxygen saturation is 88%, which is hypoxic. Head is normocephalic and atraumatic. PERRLA, EOMI. Oropharynx is clear. Neck is nontender and supple without adenopathy or JVD. Back is nontender and there is no CVA tenderness. Lungs are clear without rales, wheezes, or  rhonchi. Chest is nontender. Heart has r an irregular rhythm without murmur. Abdomen is soft, protuberant but without any fluid wave. Extremities have 1+ edema, full range of motion is present. Skin is warm and dry without rash. Neurologic: Mental status is normal, cranial nerves are intact, moves all extremities equally.  ED Results / Procedures / Treatments   Labs (all labs ordered are listed, but only abnormal results are displayed) Labs Reviewed  BRAIN NATRIURETIC PEPTIDE - Abnormal; Notable for the following components:      Result Value   B Natriuretic Peptide 1,036.6 (*)    All other components within normal limits  BASIC METABOLIC PANEL - Abnormal; Notable for the following components:   Potassium 3.0 (*)    Glucose, Bld 119 (*)    BUN 43 (*)    Creatinine, Ser 2.43 (*)    Calcium 8.5 (*)    GFR, Estimated 27 (*)  All other components within normal limits  CBC WITH DIFFERENTIAL/PLATELET - Abnormal; Notable for the following components:   RBC 3.89 (*)    Hemoglobin 10.9 (*)    HCT 35.1 (*)    RDW 17.4 (*)    Platelets 133 (*)    All other components within normal limits  URINALYSIS, ROUTINE W REFLEX MICROSCOPIC - Abnormal; Notable for the following components:   Color, Urine YELLOW (*)    APPearance CLEAR (*)    Protein, ur TRACE (*)    Bacteria, UA RARE (*)    All other components within normal limits  PROTIME-INR - Abnormal; Notable for the following components:   Prothrombin Time 43.0 (*)    INR 4.5 (*)    All other components within normal limits  TROPONIN I (HIGH SENSITIVITY) - Abnormal; Notable for the following components:   Troponin I (High Sensitivity) 29 (*)    All other components within normal limits  RESP PANEL BY RT-PCR (FLU A&B, COVID) [id number]    EKG EKG Interpretation  Date/Time:  Sunday May 01 2021 00:51:39 EST Ventricular Rate:  60 PR Interval:  396 QRS Duration: 248 QT Interval:  574 QTC Calculation: 574 R Axis:   -59 Text  Interpretation: VENTRICULAR PACED RHYTHM No old tracing to compare Reconfirmed by Delora Fuel ([id number]) on 05/01/2021 4:33:50 AM  Radiology DG Chest Port 1 View  Result Date: 05/01/2021 CLINICAL DATA:  Shortness of breath for 2 days, initial encounter EXAM: PORTABLE CHEST 1 VIEW COMPARISON:  12/14/2020 FINDINGS: Cardiac shadow is enlarged. Pacing device is noted and stable. Increased vascular congestion is noted with mild interstitial edema. Chronic scarring along the minor fissure is noted. Mild bibasilar atelectasis is noted. No bony abnormality is seen. IMPRESSION: Vascular congestion and basilar atelectasis. Electronically Signed   By: Inez Catalina M.D.   On: 05/01/2021 00:50    Procedures Procedures   Medications Ordered in ED Medications  furosemide (LASIX) injection 80 mg (80 mg Intravenous Given 05/01/21 0249)  potassium chloride SA (KLOR-CON M) CR tablet 40 mEq (40 mEq Oral Given 05/01/21 0342)    ED Course  I have reviewed the triage vital signs and the nursing notes.  Pertinent labs & imaging results that were available during my care of the patient were reviewed by me and considered in my medical decision making (see chart for details).   MDM Rules/Calculators/A&P                         CHF exacerbation.  Patient was noted to be hypoxic at triage, placed on nasal oxygen with improvement in oxygen saturation.  Patient states he feels better with the oxygen in place.  Old records are reviewed confirming management of heart failure by cardiologists in [facility].  Echocardiogram on 02/09/2021 showed ejection fraction of 30-35%.  We will check chest x-ray and screening labs.  He is given a dose of intravenous furosemide.  Chest x-ray is consistent with heart failure.  ECG shows a ventricular paced rhythm.  Labs show renal insufficiency in a similar range that he has been at in the past.  Hypokalemia is present and he is given oral potassium.  Troponin is slightly elevated and felt to  represent demand ischemia and not ACS.  BNP is significantly elevated at 1037.  He did not have a significant output from a dose of intravenous furosemide, will need hospital admission.  Case is discussed with Dr. Bridgett Larsson of [facility], who agrees to  admit the patient.  Final Clinical Impression(s) / ED Diagnoses Final diagnoses:  Acute on chronic systolic heart failure ([place])  Renal insufficiency  Hypokalemia  Elevated troponin    Rx / DC Orders ED Discharge Orders     None        Delora Fuel, MD 47/07/61 [phone]

## 2021-05-01 NOTE — Hospital Course (Signed)
75 year old male with a history of chronic systolic heart failure EF of 30%, CKD stage IV with a baseline creatinine approximately 2.5-3.2 (previously on hemodialysis), permanent pacemaker placement, chronic A. fib on Coumadin, severe mitral regurg, morbid obesity who presents to the ER with worsening shortness of breath over the last several months, weight gain.  Patient states that he has been more short of breath last 2 to 3 days.  He has gained about 2 pounds in the last day.   Patient's primary cardiologist is Dr. Otho Perl out at [facility] with [facility].  Patient states that he was recently placed on Entresto by Dr. Otho Perl.  Patient is taking Lasix at home 40 mg twice daily.  He states that he is not urinating as much. He has noted decreased urine output despite 40 mg po lasix bid this past week. Pt admits to worsening abd bloating and increased abd girth.   Of note, the patient had a bowel obstruction requiring surgery while he was visiting in [place] in the beginning of 2022.  He required admission to the [facility] and went to acute renal failure requiring initiation of hemodialysis.  He was sent to rehab and continued outpatient dialysis.  He was transferred back up to [facility] in the spring 2022 and eventually got off dialysis.  He has remained with chronic kidney disease.  His EF was noted to be about 30% by echo 02-2021. Echo noted to have moderate/severe MR.   Patient had a permanent pacemaker placed in March 2021 for bradycardia and A. fib.   All of his subspecialty cares performed at [facility] with [facility].  His only connection to [facility] health is his PCP.   On arrival to the ER tonight, temperature 98.7 heart rate 77 blood pressure 144/81 with room air saturations of 88%.   Lab evaluation BNP elevated at 1036. Chemistry sodium 139 potassium 3.0 BUN of 43 creatinine 2.43   White count 7.4, hemoglobin 10.9,  platelets 133   INR 4.5   COVID and influenza both negative.   Chest x-ray demonstrated pulmonary edema.   Due to pulmonary edema, hypoxic respiratory failure, chronic kidney disease, [facility] hospital[facility] contacted for admission.   Patient given 80 mg of IV Lasix with minimal urine output.    ED Course: Chest x-ray shows pulmonary edema.  Minimal urine output with 80 mg of IV Lasix.

## 2021-05-01 NOTE — Assessment & Plan Note (Signed)
Chronic.  Is on Coreg.  Has a pacemaker for bradycardia.

## 2021-05-01 NOTE — Consult Note (Signed)
Cardiology Consultation:   Patient ID: Steven Hester; [id number]; 01/03/1946   Admit date: 04/30/2021 Date of Consult: 05/01/2021  Primary Care Provider: Lillard Anes, MD Primary Cardiologist: Dr. Abran Hester Primary Electrophysiologist: Dr. Adrian Hester   Patient Profile:   Steven Hester is a 75 y.o. male with a history of CAD status post previous inferior wall infarct with DES intervention to the RCA, circumflex, and LAD in [place] back 2007, PAF, sinus node dysfunction with Medtronic pacemaker in place, ischemic cardiomyopathy, moderate to severe mitral regurgitation, hypertension, hyperlipidemia and CKD stage IV who is being seen today for the evaluation of shortness of breath at the request of Dr Steven Hester.  History of Present Illness:   Steven Hester is a cardiac patient Dr. Otho Hester in [facility] and Dr. Ola Hester at [facility] with history discussed above.  He presents to the [facility] via EMS complaining of worsening shortness of breath.  Per discussion symptoms have been present for at least the last 2 weeks, worse in the last 48 hours and associated with decreased urine output.  Most recent addition to his medical regimen was Delene Loll that began this past Friday.  His weight may be up about 5 pounds over baseline per discussion.  He does not describe any palpitations or angina.  With IV Lasix he has started to urinate more briskly as of this morning and stated he is feeling somewhat better.  Complains of protuberance of his abdomen, some leg swelling left greater than right.  Also orthopnea.  ECG shows probable sinus rhythm with significantly prolonged PR interval and ventricular pacing.  Chest x-ray reveals vascular congestion and BNP is elevated at 1036 with high-sensitivity troponin I 129.  Current creatinine 2.43.  He is on Coumadin as an outpatient with supratherapeutic INR currently 4.5.  Testing negative for COVID-19 and  influenza A.  Past Medical History:  Diagnosis Date   CAD (coronary artery disease)    DES to RCA, circumflex, OM in [place]   CKD (chronic kidney disease) stage 4, GFR 15-29 ml/min (HCC)    Essential hypertension    Inferior myocardial infarction [facility])    Ischemic cardiomyopathy    Mitral regurgitation    Mixed hyperlipidemia    Pacemaker    Medtronic   Persistent atrial fibrillation (HCC)    Sinus node dysfunction (HCC)    Type 2 diabetes mellitus ([place])     Past Surgical History:  Procedure Laterality Date   COLON SURGERY     CORONARY ANGIOPLASTY WITH STENT PLACEMENT     PACEMAKER INSERTION       Inpatient Medications: Scheduled Meds:  amiodarone  200 mg Oral Daily   atorvastatin  40 mg Oral Daily   carvedilol  3.125 mg Oral BID   furosemide  80 mg Intravenous Q12H   metolazone  2.5 mg Oral BID   potassium chloride  20 mEq Oral Daily   ranolazine  500 mg Oral BID   sodium chloride flush  3 mL Intravenous Q12H   Warfarin - Pharmacist Dosing Inpatient   Does not apply q1600   Continuous Infusions:  sodium chloride     PRN Meds: sodium chloride, acetaminophen, ondansetron (ZOFRAN) IV, perflutren lipid microspheres (DEFINITY) IV suspension, sodium chloride flush  Allergies:    Allergies  Allergen Reactions   Ezetimibe Hives    Zetia    Simvastatin Other (See Comments)    Other reaction(s): Myalgias (intolerance)   Niacin Rash  Social History:   Social History   Tobacco Use   Smoking status: Former    Packs/day: 1.00    Years: 10.00    Pack years: 10.00    Types: Cigarettes   Smokeless tobacco: Never  Substance Use Topics   Alcohol use: Yes    Comment: occaionally      Family History:   The patient's family history includes Cancer in his father; GI Disease in his father; Heart attack in his mother.  ROS:  Please see the history of present illness.  All other ROS reviewed and negative.     Physical Exam/Data:   Vitals:    05/01/21 0300 05/01/21 0400 05/01/21 0500 05/01/21 0830  BP: 128/79 123/75 121/74 121/71  Pulse: 64 60 (!) 58 61  Resp: (!) 23 13 (!) 21 (!) 28  Temp:      TempSrc:      SpO2: 97% 96% 97% 95%  Weight:      Height:        Intake/Output Summary (Last 24 hours) at 05/01/2021 0951 Last data filed at 05/01/2021 0850 Gross per 24 hour  Intake 240 ml  Output --  Net 240 ml   Filed Weights   04/30/21 2341  Weight: 127.9 kg   Body mass index is 37.21 kg/m.   Gen: Patient sitting up in bed in no distress. HEENT: Conjunctiva and lids normal, oropharynx cle. Neck: Supple, JVP 12 cmH2O, or carotid bruits, no thyromegaly. Lungs: Scattered rhonchi without wheezing, nonlabored breathing at rest. Cardiac: Regular rate and rhythm, no S3, 2/6 apical systolic murmur, no pericardial rub. Abdomen: Protuberant, bowel sounds present, no guarding or rebound. Extremities: 2+ leg edema, left worse than right.  Hemosiderin changes of the skin/venous stasis. Skin: Warm and dry. Musculoskeletal: No kyphosis. Neuropsychiatric: Alert and oriented x3, affect grossly appropriate.  Telemetry:  I personally reviewed telemetry which shows ventricular pacing.  Relevant CV Studies:  Echocardiogram 02/09/2021: SUMMARY  The left ventricle is severely dilated.  Left ventricular systolic function is moderate to severely reduced.  LV ejection fraction = 30-35%.  Global hypokinesis with severe hypokinesis of the inferior walls.  There is a pacemaker lead in the right ventricle.  There is mild mitral annular calcification.  There is trivial mitral valve thickening.  There is moderate to severe mitral regurgitation.  The regurgitant jet is posteriorly-directed.  There is mild to moderate tricuspid regurgitation.  Estimated right ventricular systolic pressure is 48 mmHg.  Mild pulmonary hypertension.  Probably no significant change in comparison with the prior study  noted   Laboratory  Data:  Chemistry Recent Labs  Lab 05/01/21 0205  NA 139  K 3.0*  CL 101  CO2 29  GLUCOSE 119*  BUN 43*  CREATININE 2.43*  CALCIUM 8.5*  GFRNONAA 27*  ANIONGAP 9    No results for input(s): PROT, ALBUMIN, AST, ALT, ALKPHOS, BILITOT in the last 168 hours. Hematology Recent Labs  Lab 05/01/21 0205  WBC 7.4  RBC 3.89*  HGB 10.9*  HCT 35.1*  MCV 90.2  MCH 28.0  MCHC 31.1  RDW 17.4*  PLT 133*   Cardiac Enzymes Recent Labs  Lab 05/01/21 0205  TROPONINIHS 29*   BNP Recent Labs  Lab 05/01/21 0204  BNP 1,036.6*     Radiology/Studies:  DG Chest Port 1 View  Result Date: 05/01/2021 CLINICAL DATA:  Shortness of breath for 2 days, initial encounter EXAM: PORTABLE CHEST 1 VIEW COMPARISON:  12/14/2020 FINDINGS: Cardiac shadow is enlarged. Pacing device is  noted and stable. Increased vascular congestion is noted with mild interstitial edema. Chronic scarring along the minor fissure is noted. Mild bibasilar atelectasis is noted. No bony abnormality is seen. IMPRESSION: Vascular congestion and basilar atelectasis. Electronically Signed   By: Inez Catalina M.D.   On: 05/01/2021 00:50    Assessment and Plan:   1.  HFrEF with ischemic cardiomyopathy and acute on chronic systolic heart failure.  Possibly 5 pounds over baseline weight, symptoms of shortness of breath and abdominal bloating over the last 2 weeks.  Most recent change in medical regimen was attempted addition of Entresto.  LVEF 30 to 35% with reported global hypokinesis and more prominent hypokinesis of the inferior wall by echocardiogram in October.  Also moderate to severe eccentric mitral regurgitation, RVSP estimated 48 mmHg.  Outpatient regimen included Coreg, Lasix 40 mg twice daily, Imdur, Ranexa, and just recently Entresto.  2.  CKD stage IV, creatinine 2.43.  3.  Persistent atrial fibrillation.  He has been on amiodarone for rhythm suppression and status post cardioversion.  Has sick sinus syndrome with  Medtronic pacemaker in place and followed by Dr. Ola Hester.  He is on Coumadin for stroke prophylaxis, presently supratherapeutic with INR 4.5.  4.  CAD with apparent previous inferior wall infarct and status post DES interventions to the RCA, circumflex, and OM back in 2007 in [place].  5.  Moderate to severe, eccentric mitral regurgitation.  Patient being admitted to the hospitalist team.  Agree with conversion to IV Lasix 80 mg twice daily for now.  Continue amiodarone, low-dose Coreg, Ranexa, and Lipitor.  Adjust Coumadin per pharmacy.  Consider device interrogation to evaluate atrial fibrillation burden.  Follow-up echocardiogram pending for baseline reassessment of LVEF and valvular disease.  Would not resume Entresto in light of his current degree of renal insufficiency, nor is a candidate for Aldactone or SGLT2 inhibitor.  Looks like his creatinine has fluctuated from 2.4 up to 3.3 in the last 6 months.  If diuresis continues and he improves clinically, most likely a situation where we would get him stabilized and then back to see his regular cardiology team as he intends to maintain follow-up with them.  If he does not improve as expected with diuresis, would move him over to [facility] so that the advanced heart failure team can see him.  Whether or not he needs a right heart catheterization, or is a candidate for upgrade to a biventricular device or even MitraClip are certainly worth templating.  Signed, Rozann Lesches, MD  05/01/2021 9:51 AM

## 2021-05-01 NOTE — Assessment & Plan Note (Signed)
Continue his Ranexa.  We will hold his Imdur to give his blood pressure more room for diuresis.  Continue his statin.  Continue his low-dose beta-blocker.

## 2021-05-01 NOTE — Progress Notes (Signed)
This is a no charge note, for further details please see H&P by Dr. Bridgett Larsson from earlier today.  * Acute on chronic systolic CHF (congestive heart failure) ([place])- (present on admission)  Appears to be making good response to lasix and feeling better.  Hopefully, will diurese, feel better, and be able to discharge with follow up with his primary Cardiologist.  If worsneing renal funciton or other failure of medical diuresis, will transfer to [facility] or [facility] for advanced HF team evaluation     Acute respiratory failure with hypoxia ([place])- (present on admission)       Mitral regurgitation - severe- (present on admission)    Presence of cardiac pacemaker - placed 07-29-2019. medtronic.- (present on admission)    CAD in native artery- (present on admission)    Acquired thrombophilia ([place])- (present on admission)    Warfarin anticoagulation    Chronic atrial fibrillation ([place])- (present on admission)    Morbid obesity ([place])- (present on admission)

## 2021-05-01 NOTE — Progress Notes (Addendum)
ANTICOAGULATION CONSULT NOTE - Initial Consult  Pharmacy Consult for Warfarin Indication: atrial fibrillation  Allergies  Allergen Reactions   Ezetimibe Hives    Zetia    Simvastatin Other (See Comments)    Other reaction(s): Myalgias (intolerance)   Niacin Rash    Patient Measurements: Height: 6\' 1"  (185.4 cm) Weight: 127.9 kg (282 lb) IBW/kg (Calculated) : 79.9   Vital Signs: Temp: 98.7 F (37.1 C) (12/24 2341) Temp Source: Oral (12/24 2341) BP: 121/74 (12/25 0500) Pulse Rate: 58 (12/25 0500)  Labs: Recent Labs    05/01/21 0205  HGB 10.9*  HCT 35.1*  PLT 133*  LABPROT 43.0*  INR 4.5*  CREATININE 2.43*  TROPONINIHS 29*    Estimated Creatinine Clearance: 35.7 mL/min (A) (by C-G formula based on SCr of 2.43 mg/dL (H)).   Medical History: Past Medical History:  Diagnosis Date   Chronic atrial fibrillation (HCC)    Essential hypertension    HLD (hyperlipidemia)    Mixed hyperlipidemia    Other primary thrombocytopenia ([place]) 08/27/2019   Type 2 diabetes mellitus with other specified complication (HCC)     Medications:  PTA Warfarin 5mg  daily except 2.5mg  on Wed & Fri  Assessment: 75 yr male admitted with CHF PMH significant for CHR, CKD, pacemaker placement, AFib, DVT, mitral regurgitation.  Last reported Warfarin dose was 12/24 @ 1700 INR upon admission = 4.5  Goal of Therapy:  INR 2-3    Plan:  No warfarin today due to supratherapeutic INR Daily PT/INR and dose warfarin accordingly  Steven Hester, [name], PharmD 05/01/2021,5:44 AM

## 2021-05-01 NOTE — Assessment & Plan Note (Addendum)
Prior echo showed severe mitral regurg.  This is concerning due to the fact that his EF was reported to be 30%.  With mitral regurg, his actual EF is probably less than 30%.   We will need to repeat his echo.  I do not think he is in cardiogenic shock at this point.  He may need to be seen by the structural heart team to see if he qualifies for Mitral Clip.

## 2021-05-01 NOTE — Plan of Care (Signed)
°  Problem: Education: Goal: Knowledge of General Education information will improve Description: Including pain rating scale, medication(s)/side effects and non-pharmacologic comfort measures Outcome: Progressing   Problem: Clinical Measurements: Goal: Respiratory complications will improve Outcome: Progressing   Problem: Activity: Goal: Risk for activity intolerance will decrease Outcome: Progressing   Problem: Elimination: Goal: Will not experience complications related to bowel motility Outcome: Progressing

## 2021-05-01 NOTE — Assessment & Plan Note (Signed)
History of DVT.  Continue Coumadin.  We will have pharmacy manage his INR.

## 2021-05-01 NOTE — H&P (Addendum)
History and Physical    Steven Hester:[id number] DOB: 1945/08/08 DOA: 04/30/2021  PCP: Lillard Anes, MD   Patient coming from: Home  I have personally briefly reviewed patient's old medical records in [place]  CC: SOB, weight gain HPI: 75 year old male with a history of chronic systolic heart failure EF of 30%, CKD stage IV with a baseline creatinine approximately 2.5-3.2 (previously on hemodialysis), permanent pacemaker placement, chronic A. fib on Coumadin, severe mitral regurg, morbid obesity who presents to the ER with worsening shortness of breath over the last several months, weight gain.  Patient states that he has been more short of breath last 2 to 3 days.  He has gained about 2 pounds in the last day.  Patient's primary cardiologist is Dr. Otho Perl out at [facility] with [facility].  Patient states that he was recently placed on Entresto by Dr. Otho Perl.  Patient is taking Lasix at home 40 mg twice daily.  He states that he is not urinating as much. He has noted decreased urine output despite 40 mg po lasix bid this past week. Pt admits to worsening abd bloating and increased abd girth.  Of note, the patient had a bowel obstruction requiring surgery while he was visiting in [place] in the beginning of 2022.  He required admission to the [facility] and went to acute renal failure requiring initiation of hemodialysis.  He was sent to rehab and continued outpatient dialysis.  He was transferred back up to [facility] in the spring 2022 and eventually got off dialysis.  He has remained with chronic kidney disease.  His EF was noted to be about 30% by echo 02-2021. Echo noted to have moderate/severe MR.  Patient had a permanent pacemaker placed in March 2021 for bradycardia and A. fib.  All of his subspecialty cares performed at [facility] associated with [facility].  His only connection to [facility] health  is his PCP.  On arrival to the ER tonight, temperature 98.7 heart rate 77 blood pressure 144/81 with room air saturations of 88%.  Lab evaluation BNP elevated at 1036. Chemistry sodium 139 potassium 3.0 BUN of 43 creatinine 2.43  White count 7.4, hemoglobin 10.9, platelets 133  INR 4.5  COVID and influenza both negative.  Chest x-ray demonstrated pulmonary edema.  Due to pulmonary edema, hypoxic respiratory failure, chronic kidney disease, [facility] hospital[facility] contacted for admission.  Patient given 80 mg of IV Lasix with minimal urine output.   ED Course: Chest x-ray shows pulmonary edema.  Minimal urine output with 80 mg of IV Lasix.  Review of Systems:  Review of Systems  Constitutional:  Positive for malaise/fatigue. Negative for chills and fever.  HENT: Negative.    Eyes: Negative.   Respiratory:  Positive for shortness of breath.   Cardiovascular:  Positive for orthopnea and leg swelling.  Gastrointestinal:        Increasing abdominal girth  Genitourinary: Negative.   Musculoskeletal: Negative.   Skin:        Worsening lower extremity edema  Neurological:  Positive for weakness.  Endo/Heme/Allergies: Negative.   Psychiatric/Behavioral: Negative.    All other systems reviewed and are negative.  Past Medical History:  Diagnosis Date   Chronic atrial fibrillation ([place])    Essential hypertension    HLD (hyperlipidemia)    Mixed hyperlipidemia    Other primary thrombocytopenia ([place]) 08/27/2019   Type 2 diabetes mellitus with other specified complication ([place])  Past Surgical History:  Procedure Laterality Date   COLON SURGERY     CORONARY ANGIOPLASTY WITH STENT PLACEMENT     PACEMAKER INSERTION       reports that he has quit smoking. His smoking use included cigarettes. He has a 10.00 pack-year smoking history. He has never used smokeless tobacco. He reports current alcohol use. He reports that he does not use drugs.  Allergies  Allergen Reactions    Ezetimibe Hives    Zetia    Simvastatin Other (See Comments)    Other reaction(s): Myalgias (intolerance)   Niacin Rash    Family History  Problem Relation Age of Onset   Heart attack Mother    GI Disease Father    Cancer Father     Prior to Admission medications   Medication Sig Start Date End Date Taking? Authorizing Provider  amiodarone (PACERONE) 200 MG tablet Take 200 mg by mouth daily.   Yes [provider]  atorvastatin (LIPITOR) 40 MG tablet Take 1 tablet (40 mg total) by mouth daily. 10/20/20  Yes Lillard Anes, MD  carvedilol (COREG) 3.125 MG tablet Take 3.125 mg by mouth 2 (two) times daily. 12/02/20  Yes [provider]  furosemide (LASIX) 40 MG tablet Take 40 mg by mouth 2 (two) times daily. 09/28/20  Yes [provider]  isosorbide mononitrate (IMDUR) 30 MG 24 hr tablet Take 30 mg by mouth daily.   Yes [provider]  ranolazine (RANEXA) 500 MG 12 hr tablet Take 500 mg by mouth 2 (two) times daily. 04/28/21  Yes [provider]  Sennosides (LAXATIVE) 25 MG TABS Take 25 mg by mouth daily.   Yes [provider]  warfarin (COUMADIN) 2.5 MG tablet Take 2.5 mg by mouth See admin instructions. Take 2.5mg  by mouth once daily on Wednesday and Friday   Yes [provider]  warfarin (COUMADIN) 5 MG tablet Take 5 mg by mouth See admin instructions. Take 5mg  by mouth once daily every Sunday, Monday, Tuesday, Thursday, Saturday   Yes [provider]  ACCU-CHEK GUIDE test strip USE 1 STRIP TO [place] 02/14/21   Lillard Anes, MD  nitroGLYCERIN (NITROSTAT) 0.4 MG SL tablet Place under the tongue. Patient not taking: Reported on 05/01/2021 10/24/18 05/01/22  [provider]    Physical Exam: Vitals:   05/01/21 0200 05/01/21 0300 05/01/21 0400 05/01/21 0500  BP: 111/75 128/79 123/75 121/74  Pulse: (!) 58 64 60 (!) 58  Resp: 16 (!) 23 13 (!) 21  Temp:      TempSrc:       SpO2: 97% 97% 96% 97%  Weight:      Height:        Physical Exam Vitals and nursing note reviewed.  Constitutional:      General: He is not in acute distress.    Appearance: He is obese. He is not ill-appearing, toxic-appearing or diaphoretic.  HENT:     Head: Normocephalic and atraumatic.     Nose: Nose normal. No rhinorrhea.  Eyes:     General:        Right eye: No discharge.        Left eye: No discharge.  Cardiovascular:     Rate and Rhythm: Normal rate and regular rhythm.     Pulses: Normal pulses.  Pulmonary:     Breath sounds: Examination of the right-middle field reveals rales. Examination of the left-middle field reveals rales. Examination of the right-lower field reveals  rales. Examination of the left-lower field reveals rales. Rales present.  Abdominal:     General: Abdomen is protuberant. Bowel sounds are normal. There is distension.     Palpations: Abdomen is soft.     Tenderness: There is no abdominal tenderness. There is no guarding or rebound.  Musculoskeletal:     Right lower leg: 1+ Edema present.     Left lower leg: 1+ Edema present.     Right ankle: Swelling present.     Left ankle: Swelling present.     Right foot: Swelling present.     Left foot: Swelling present.  Skin:    General: Skin is warm and dry.     Capillary Refill: Capillary refill takes less than 2 seconds.     Comments: Bilateral chronic venous stasis of both lower extremities.  Neurological:     General: No focal deficit present.     Mental Status: He is alert and oriented to person, place, and time.     Labs on Admission: I have personally reviewed following labs and imaging studies  CBC: Recent Labs  Lab 05/01/21 0205  WBC 7.4  NEUTROABS 5.6  HGB 10.9*  HCT 35.1*  MCV 90.2  PLT 443*   Basic Metabolic Panel: Recent Labs  Lab 05/01/21 0205  NA 139  K 3.0*  CL 101  CO2 29  GLUCOSE 119*  BUN 43*  CREATININE 2.43*  CALCIUM 8.5*   GFR: Estimated Creatinine  Clearance: 35.7 mL/min (A) (by C-G formula based on SCr of 2.43 mg/dL (H)). Liver Function Tests: No results for input(s): AST, ALT, ALKPHOS, BILITOT, PROT, ALBUMIN in the last 168 hours. No results for input(s): LIPASE, AMYLASE in the last 168 hours. No results for input(s): AMMONIA in the last 168 hours. Coagulation Profile: Recent Labs  Lab 05/01/21 0205  INR 4.5*   Cardiac Enzymes: No results for input(s): CKTOTAL, CKMB, CKMBINDEX, TROPONINI in the last 168 hours. BNP (last 3 results) No results for input(s): PROBNP in the last 8760 hours. HbA1C: No results for input(s): HGBA1C in the last 72 hours. CBG: No results for input(s): GLUCAP in the last 168 hours. Lipid Profile: No results for input(s): CHOL, HDL, LDLCALC, TRIG, CHOLHDL, LDLDIRECT in the last 72 hours. Thyroid Function Tests: No results for input(s): TSH, T4TOTAL, FREET4, T3FREE, THYROIDAB in the last 72 hours. Anemia Panel: No results for input(s): VITAMINB12, FOLATE, FERRITIN, TIBC, IRON, RETICCTPCT in the last 72 hours. Urine analysis:    Component Value Date/Time   COLORURINE YELLOW (A) 05/01/2021 0019   APPEARANCEUR CLEAR (A) 05/01/2021 0019   LABSPEC 1.020 05/01/2021 0019   PHURINE 6.0 05/01/2021 0019   GLUCOSEU NEGATIVE 05/01/2021 0019   HGBUR NEGATIVE 05/01/2021 0019   BILIRUBINUR NEGATIVE 05/01/2021 0019   KETONESUR NEGATIVE 05/01/2021 0019   PROTEINUR TRACE (A) 05/01/2021 0019   NITRITE NEGATIVE 05/01/2021 0019   LEUKOCYTESUR NEGATIVE 05/01/2021 0019    Radiological Exams on Admission: I have personally reviewed images DG Chest Port 1 View  Result Date: 05/01/2021 CLINICAL DATA:  Shortness of breath for 2 days, initial encounter EXAM: PORTABLE CHEST 1 VIEW COMPARISON:  12/14/2020 FINDINGS: Cardiac shadow is enlarged. Pacing device is noted and stable. Increased vascular congestion is noted with mild interstitial edema. Chronic scarring along the minor fissure is noted. Mild bibasilar atelectasis  is noted. No bony abnormality is seen. IMPRESSION: Vascular congestion and basilar atelectasis. Electronically Signed   By: Inez Catalina M.D.   On: 05/01/2021 00:50    EKG:  I have personally reviewed EKG: paced rhythm    Assessment/Plan Principal Problem:   Acute on chronic systolic CHF (congestive heart failure) (HCC) Active Problems:   Cardiorenal syndrome, stage 1-4 or unspecified chronic kidney disease, with heart failure (HCC)   Acute respiratory failure with hypoxia (HCC)   Chronic atrial fibrillation (HCC)   CAD in native artery   Morbid obesity ([place])   Presence of cardiac pacemaker - placed 07-29-2019. medtronic.   Mitral regurgitation - severe   Warfarin anticoagulation   Acquired thrombophilia (HCC)    Acute on chronic systolic CHF (congestive heart failure) ([place]) Admit to progressive telemetry bed.  Since all of his subspecialty care including nephrology and cardiology are at [facility] health associated with [facility], will need consultations with both cardiology and nephrology.  I am concerned about his minimal urine output with 80 of Lasix.  I think he is suffering from cardiorenal syndrome due to low cardiac output and chronic kidney disease.  I explained to the patient that he may need to reinitiate dialysis due to his poor renal function and weak heart.  I also suggested to him that he may need to consider transfer back to [facility] at [facility] since all of his subspecialty care is performed there and his physicians known him well there.  He states that he will think about this and let the rounding physician know.  For now we will continue with IV Lasix.  He will need both input from cardiology and nephrology.  We will hold his Entresto for now to give his blood pressure more room for diuresis.  Cardiorenal syndrome, stage 1-4 or unspecified chronic kidney disease, with heart failure ([place]) Concerned about cardiorenal syndrome  in this patient with systolic failure, CKD stage IV and minimum urine output with IV Lasix. He will need nephrology consultation during this admission.  Acute respiratory failure with hypoxia (HCC) Continue with supplemental oxygen.  Patient does not use home O2.  Acquired thrombophilia ([place]) History of DVT.  Continue Coumadin.  We will have pharmacy manage his INR.  Chronic atrial fibrillation (HCC) Chronic.  Is on Coreg.  Has a pacemaker for bradycardia.  Mitral regurgitation - severe Prior echo showed severe mitral regurg.  This is concerning due to the fact that his EF was reported to be 30%.  With mitral regurg, his actual EF is probably less than 30%.   We will need to repeat his echo.  I do not think he is in cardiogenic shock at this point.  He may need to be seen by the structural heart team to see if he qualifies for Mitral Clip.  Morbid obesity ([place]) Chronic  Presence of cardiac pacemaker - placed 07-29-2019. medtronic. Chronic pacemaker  Warfarin anticoagulation INR elevated at 4.5.  We will have pharmacy manage his Coumadin.  CAD in native artery Continue his Ranexa.  We will hold his Imdur to give his blood pressure more room for diuresis.  Continue his statin.  Continue his low-dose beta-blocker.  DVT prophylaxis: Coumadin Code Status: Full Code Family Communication: no family at bedside. Contact is pt's son. Pt's wife has severe dementia  Disposition Plan: return home  Consults called: none  Admission status: Inpatient, Telemetry bed   Kristopher Oppenheim, DO [facility] 05/01/2021, 5:57 AM

## 2021-05-01 NOTE — Assessment & Plan Note (Signed)
Continue with supplemental oxygen.  Patient does not use home O2.

## 2021-05-01 NOTE — Assessment & Plan Note (Signed)
Chronic. 

## 2021-05-01 NOTE — Assessment & Plan Note (Signed)
Admit to progressive telemetry bed.  Since all of his subspecialty care including nephrology and cardiology are at [facility] health associated with [facility], will need consultations with both cardiology and nephrology.  I am concerned about his minimal urine output with 80 of Lasix.  I think he is suffering from cardiorenal syndrome due to low cardiac output and chronic kidney disease.  I explained to the patient that he may need to reinitiate dialysis due to his poor renal function and weak heart.  I also suggested to him that he may need to consider transfer back to [facility] at [facility] since all of his subspecialty care is performed there and his physicians known him well there.  He states that he will think about this and let the rounding physician know.  For now we will continue with IV Lasix.  He will need both input from cardiology and nephrology.  We will hold his Entresto for now to give his blood pressure more room for diuresis.

## 2021-05-01 NOTE — Assessment & Plan Note (Addendum)
INR elevated at 4.5.  We will have pharmacy manage his Coumadin.

## 2021-05-01 NOTE — Assessment & Plan Note (Signed)
Chronic pacemaker

## 2021-05-01 NOTE — Progress Notes (Signed)
°  Echocardiogram 2D Echocardiogram has been performed.  Steven Hester 05/01/2021, 8:52 AM

## 2021-05-01 NOTE — Progress Notes (Signed)
Patient placed nasal canula for comfort. [place] at 2L

## 2021-05-01 NOTE — Assessment & Plan Note (Addendum)
Concerned about cardiorenal syndrome in this patient with systolic failure, CKD stage IV and minimum urine output with IV Lasix. He will need nephrology consultation during this admission.

## 2021-05-02 DIAGNOSIS — I480 Paroxysmal atrial fibrillation: Secondary | ICD-10-CM

## 2021-05-02 DIAGNOSIS — I5023 Acute on chronic systolic (congestive) heart failure: Secondary | ICD-10-CM | POA: Diagnosis not present

## 2021-05-02 DIAGNOSIS — I2510 Atherosclerotic heart disease of native coronary artery without angina pectoris: Secondary | ICD-10-CM

## 2021-05-02 DIAGNOSIS — N184 Chronic kidney disease, stage 4 (severe): Secondary | ICD-10-CM | POA: Diagnosis not present

## 2021-05-02 LAB — BASIC METABOLIC PANEL
Anion gap: 10 (ref 5–15)
BUN: 47 mg/dL — ABNORMAL HIGH (ref 8–23)
CO2: 31 mmol/L (ref 22–32)
Calcium: 8.7 mg/dL — ABNORMAL LOW (ref 8.9–10.3)
Chloride: 100 mmol/L (ref 98–111)
Creatinine, Ser: 2.89 mg/dL — ABNORMAL HIGH (ref 0.61–1.24)
GFR, Estimated: 22 mL/min — ABNORMAL LOW (ref >60)
Glucose, Bld: 105 mg/dL — ABNORMAL HIGH (ref 70–99)
Potassium: 3.5 mmol/L (ref 3.5–5.1)
Sodium: 141 mmol/L (ref 135–145)

## 2021-05-02 LAB — PROTIME-INR
INR: 3.7 — ABNORMAL HIGH (ref 0.8–1.2)
Prothrombin Time: 36.6 seconds — ABNORMAL HIGH (ref 11.4–15.2)

## 2021-05-02 LAB — MAGNESIUM: Magnesium: 2.4 mg/dL (ref 1.7–2.4)

## 2021-05-02 LAB — GLUCOSE, CAPILLARY: Glucose-Capillary: 100 mg/dL — ABNORMAL HIGH (ref 70–99)

## 2021-05-02 MED ORDER — FUROSEMIDE 40 MG PO TABS: 60.0000 mg | ORAL_TABLET | Freq: Two times a day (BID) | ORAL | 0 refills | Status: DC

## 2021-05-02 MED ORDER — POTASSIUM CHLORIDE 20 MEQ PO PACK
40.0000 meq | PACK | Freq: Once | ORAL | Status: AC
  Administered 2021-05-02: 09:00:00 40 meq via ORAL
  Filled 2021-05-02: qty 2

## 2021-05-02 NOTE — Progress Notes (Signed)
Progress Note  Patient Name: Taksh Hjort Criger Date of Encounter: 05/02/2021  Primary Cardiologist: Dr. Abran Richard [facility] Primary EP: Dr. Ola Spurr  Subjective   Patient notes that he feels back to baseline  Notes that he has had frequent urination that thinks the medication has worked well.  Denies abdominal distention and notes that his LE edema is improved.  Eager to return home.  Creatinine is elevated.  Inpatient Medications    Scheduled Meds:  amiodarone  200 mg Oral Daily   atorvastatin  40 mg Oral Daily   carvedilol  3.125 mg Oral BID   furosemide  80 mg Intravenous Q12H   ranolazine  500 mg Oral BID   sodium chloride flush  3 mL Intravenous Q12H   Warfarin - Pharmacist Dosing Inpatient   Does not apply q1600   Continuous Infusions:  sodium chloride     PRN Meds: sodium chloride, acetaminophen, ondansetron (ZOFRAN) IV, sodium chloride flush   Vital Signs    Vitals:   05/01/21 1055 05/01/21 1451 05/01/21 2231 05/02/21 0404  BP:  116/75 115/75 111/66  Pulse:  (!) 59 60 65  Resp:  18 18 20   Temp:  97.6 F (36.4 C) 97.7 F (36.5 C) 97.6 F (36.4 C)  TempSrc:  Oral Oral Oral  SpO2: 99% 96% 96% 93%  Weight:    126.3 kg  Height:        Intake/Output Summary (Last 24 hours) at 05/02/2021 0800 Last data filed at 05/02/2021 0700 Gross per 24 hour  Intake 1280 ml  Output 2750 ml  Net -1470 ml   Filed Weights   04/30/21 2341 05/01/21 1044 05/02/21 0404  Weight: 127.9 kg 128.7 kg 126.3 kg    Telemetry    AP-VP - Personally Reviewed  ECG    AF-VP - Personally Reviewed  Physical Exam   Gen: no distress, morbid obesity  Neck: No JVD,  Ears: L ear Frank Sign Cardiac: No Rubs or Gallops, soft late systolic Murmur, regular rhythm +2 radial pulses Respiratory: Clear to auscultation bilaterally, normal effort, normal  respiratory rate GI: Soft, nontenderly distended MS: +1 bilateral edema;  moves all extremities Integument: Skin feels warm Neuro:   At time of evaluation, alert and oriented to person/place/time/situation  Psych: Normal affect, patient feels    Labs    Chemistry Recent Labs  Lab 05/01/21 0205 05/02/21 0347  NA 139 141  K 3.0* 3.5  CL 101 100  CO2 29 31  GLUCOSE 119* 105*  BUN 43* 47*  CREATININE 2.43* 2.89*  CALCIUM 8.5* 8.7*  GFRNONAA 27* 22*  ANIONGAP 9 10     Hematology Recent Labs  Lab 05/01/21 0205  WBC 7.4  RBC 3.89*  HGB 10.9*  HCT 35.1*  MCV 90.2  MCH 28.0  MCHC 31.1  RDW 17.4*  PLT 133*    Cardiac EnzymesNo results for input(s): TROPONINI in the last 168 hours. No results for input(s): TROPIPOC in the last 168 hours.   BNP Recent Labs  Lab 05/01/21 0204  BNP 1,036.6*     DDimer No results for input(s): DDIMER in the last 168 hours.   Radiology    DG Chest Port 1 View  Result Date: 05/01/2021 CLINICAL DATA:  Shortness of breath for 2 days, initial encounter EXAM: PORTABLE CHEST 1 VIEW COMPARISON:  12/14/2020 FINDINGS: Cardiac shadow is enlarged. Pacing device is noted and stable. Increased vascular congestion is noted with mild interstitial edema. Chronic scarring along the minor fissure is noted.  Mild bibasilar atelectasis is noted. No bony abnormality is seen. IMPRESSION: Vascular congestion and basilar atelectasis. Electronically Signed   By: Inez Catalina M.D.   On: 05/01/2021 00:50   ECHOCARDIOGRAM COMPLETE  Result Date: 05/01/2021    ECHOCARDIOGRAM REPORT   Patient Name:   TERRIN MEDDAUGH Date of Exam: 05/01/2021 Medical Rec #:  [id number]          Height:       73.0 in Accession #:    [id number]         Weight:       282.0 lb Date of Birth:  Jul 30, 1945         BSA:          2.490 m Patient Age:    47 years           BP:           124/73 mmHg Patient Gender: M                  HR:           87 bpm. Exam Location:  Inpatient Procedure: 2D Echo, Color Doppler, Cardiac Doppler and Intracardiac            Opacification Agent Indications:    Congestive Heart Failure I50.9   History:        Patient has no prior history of Echocardiogram examinations.                 Arrythmias:Atrial Fibrillation; Risk Factors:Hypertension,                 Diabetes and Dyslipidemia.  Sonographer:    Bernadene Person RDCS Referring Phys: [place]  1. Left ventricular ejection fraction, by estimation, is 25 to 30%. The left ventricle has severely decreased function. The left ventricle demonstrates regional wall motion abnormalities (see scoring diagram/findings for description). The left ventricular internal cavity size was moderately dilated. There is mild left ventricular hypertrophy. Left ventricular diastolic parameters are indeterminate.  2. Right ventricular systolic function is normal. The right ventricular size is normal. There is moderately elevated pulmonary artery systolic pressure. The estimated right ventricular systolic pressure is 70.6 mmHg.  3. Left atrial size was moderately dilated.  4. Right atrial size was mildly dilated.  5. The mitral valve is abnormal. Severe mitral valve regurgitation, posteriorly directed.  6. Tricuspid valve regurgitation is mild to moderate.  7. The aortic valve is tricuspid. There is mild calcification of the aortic valve. Aortic valve regurgitation is mild. Aortic valve sclerosis/calcification is present, without any evidence of aortic stenosis. Aortic regurgitation PHT measures 591 msec.  8. Aortic dilatation noted. There is borderline dilatation of the aortic root, measuring 40 mm.  9. The inferior vena cava is dilated in size with <50% respiratory variability, suggesting right atrial pressure of 15 mmHg. Comparison(s): Prior images unable to be directly viewed. FINDINGS  Left Ventricle: Left ventricular ejection fraction, by estimation, is 25 to 30%. The left ventricle has severely decreased function. The left ventricle demonstrates regional wall motion abnormalities. The left ventricular internal cavity size was moderately dilated. There is  mild left ventricular hypertrophy. Abnormal (paradoxical) septal motion, consistent with RV pacemaker. Left ventricular diastolic parameters are indeterminate.  LV Wall Scoring: The entire inferior wall, posterior wall, mid inferoseptal segment, and basal inferoseptal segment are akinetic. The entire anterior wall, antero-lateral wall, entire anterior septum, apical lateral segment, and apex are hypokinetic. Right Ventricle: The right ventricular size is  normal. No increase in right ventricular wall thickness. Right ventricular systolic function is normal. There is moderately elevated pulmonary artery systolic pressure. The tricuspid regurgitant velocity is 3.15 m/s, and with an assumed right atrial pressure of 15 mmHg, the estimated right ventricular systolic pressure is 48.5 mmHg. Left Atrium: Left atrial size was moderately dilated. Right Atrium: Right atrial size was mildly dilated. Pericardium: There is no evidence of pericardial effusion. Mitral Valve: The mitral valve is abnormal. Severe mitral valve regurgitation, with posteriorly-directed jet. Tricuspid Valve: The tricuspid valve is grossly normal. Tricuspid valve regurgitation is mild to moderate. Aortic Valve: The aortic valve is tricuspid. There is mild calcification of the aortic valve. There is mild aortic valve annular calcification. Aortic valve regurgitation is mild. Aortic regurgitation PHT measures 591 msec. Aortic valve sclerosis/calcification is present, without any evidence of aortic stenosis. Pulmonic Valve: The pulmonic valve was not well visualized. Pulmonic valve regurgitation is trivial. Aorta: Aortic dilatation noted. There is borderline dilatation of the aortic root, measuring 40 mm. Venous: The inferior vena cava is dilated in size with less than 50% respiratory variability, suggesting right atrial pressure of 15 mmHg. IAS/Shunts: No atrial level shunt detected by color flow Doppler. Additional Comments: A device lead is visualized.   LEFT VENTRICLE PLAX 2D LVIDd:         6.60 cm LVIDs:         5.80 cm LV PW:         1.20 cm LV IVS:        1.20 cm LVOT diam:     2.30 cm LV SV:         74 LV SV Index:   30 LVOT Area:     4.15 cm  LV Volumes (MOD) LV vol d, MOD A2C: 277.0 ml LV vol d, MOD A4C: 257.0 ml LV vol s, MOD A2C: 154.0 ml LV vol s, MOD A4C: 167.0 ml LV SV MOD A2C:     123.0 ml LV SV MOD A4C:     257.0 ml LV SV MOD BP:      115.3 ml RIGHT VENTRICLE TAPSE (M-mode): 1.3 cm LEFT ATRIUM              Index        RIGHT ATRIUM           Index LA diam:        5.30 cm  2.13 cm/m   RA Area:     25.40 cm LA Vol (A2C):   133.0 ml 53.42 ml/m  RA Volume:   81.70 ml  32.81 ml/m LA Vol (A4C):   120.0 ml 48.19 ml/m LA Biplane Vol: 127.0 ml 51.01 ml/m  AORTIC VALVE LVOT Vmax:         91.83 cm/s LVOT Vmean:        55.967 cm/s LVOT VTI:          0.178 m AI PHT:            591 msec AR Vena Contracta: 0.20 cm  AORTA Ao Root diam: 4.00 cm Ao Asc diam:  3.80 cm MR Peak grad:    80.1 mmHg    TRICUSPID VALVE MR Mean grad:    49.5 mmHg    TR Peak grad:   39.7 mmHg MR Vmax:         447.50 cm/s  TR Vmax:        315.00 cm/s MR Vmean:        329.5 cm/s MR  PISA:         1.57 cm     SHUNTS MR PISA Eff ROA: 12 mm       Systemic VTI:  0.18 m MR PISA Radius:  0.50 cm      Systemic Diam: 2.30 cm Rozann Lesches MD Electronically signed by Rozann Lesches MD Signature Date/Time: 05/01/2021/12:51:35 PM    Final       Patient Profile     75 y.o. male with decompensated HF  Assessment & Plan     Heart Failure Reduced Ejection Fraction  CKD stage IV Moderate to Severe MR mild AI - NYHA class IV, Stage C-D, hypervolemic, etiology from MI is a component, has AF and non atrial functional MR, appears to be frequently RV pacing - Diuretic regimen: Additional 80 IV lasix X1 this AM; 60 mg PO BID of lasix - Discussed the importance of fluid restriction of < 2 L, salt restriction, and checking daily weights  - Coreg 3.125, without a lot of BP room - ARNI has been  attempted in the recent past with AKI; at this time do not plan on starting inpatient SGLT2i or MRA -  Mr. Zartman feels back to normal and is eager to head back home.  He and I discussed that he still have some fluid on him and in the balance of his heart and kidneys that he should at least get the AM IV lasix today.  He knows to take a slightly higher dose of his home lasix (60 mg PO BID) and that he has a blood Draw with Dr. Otho Perl (his primary cardiologist) next week and will et a follow up appt. At that visit, Pacing optimization and MR eval are worthy of consideration  Multi-vessel CAD HLD PAF with CHASDVASC 4 on amiodarone and coumadin - presently coumadin per pharmacy - will continue amiodarone for now - PCI was not recenty, no ASA - continue atorvastatin 40     For questions or updates, please contact [facility] Please consult [email or web address] for contact info under Cardiology/STEMI.      Signed, Werner Lean, MD  05/02/2021, 8:00 AM

## 2021-05-02 NOTE — Progress Notes (Signed)
Pt was able to ambulate to nurses station back and forth without dyspnea. states this is how much he walks at a time at home. he returned back to chair able hold conversation and not needing 02.

## 2021-05-02 NOTE — Care Management CC44 (Signed)
Condition Code 44 Documentation Completed  Patient Details  Name: Steven Hester MRN: [id number] Date of Birth: 18-Oct-1945   Condition Code 44 given:  Yes Patient signature on Condition Code 44 notice:  Yes Documentation of 2 MD's agreement:  Yes Code 44 added to claim:  Yes    Ross Ludwig, LCSW 05/02/2021, 11:07 AM

## 2021-05-02 NOTE — Discharge Summary (Signed)
Physician Discharge Summary   Patient: Steven Hester MRN: [id number] DOB: @DOB   Admit date:     04/30/2021  Discharge date: 05/02/21  Discharge Physician: Edwin Dada   PCP: Lillard Anes, MD   Recommendations at discharge: 1. Follow up with PCP Dr. Henrene Pastor in 2-3 days for INR 2. Follow up with Cardiology Dr. Otho Perl in 1 week for discussion of BiV, mitraclip, or continued medical management 3. Dr. Henrene Pastor or Dr. Otho Perl: Please obtain BMP in 1 week and adjust Lasix as needed       Discharge Diagnoses Principal Problem:   Acute on chronic systolic CHF (congestive heart failure) ([place]) Active Problems:   Acute respiratory failure with hypoxia (HCC)   Mitral regurgitation - severe   Cardiorenal syndrome, stage 1-4 or unspecified chronic kidney disease, with heart failure ([place])   Presence of cardiac pacemaker - placed 07-29-2019. medtronic.   CAD in native artery   Chronic atrial fibrillation (HCC)   Acquired thrombophilia ([place])   Anemia due to stage 4 chronic kidney disease (HCC)   Hypokalemia   Morbid obesity [facility])        Hospital Course   75 year old male with a history of chronic systolic heart failure EF of 30%, CKD stage IV with a baseline creatinine approximately 2.5-3.2, permanent pacemaker placement, chronic A. fib on Coumadin, severe mitral regurgitation, morbid obesity who presents to the ER with worsening shortness of breath over the last several months, now much worse in the last week, accelerating this last 2 days to the point that he noticed weight gain, couldn't walk.    On arrival to the ER, tachypneic to 23-28 bpm with room air saturations of 88%.  BNP elevated at 1036. Potassium 3.0 BUN of 43 creatinine 2.43.  COVID and influenza both negative.  Chest x-ray demonstrated pulmonary edema.         * Acute on chronic systolic CHF (congestive heart failure) ([place])- (present on admission) Admitted and started on IV Lasix.  Cardiology  consulted, echo obtained.  Diuresed 2.7L, felt symptoms were back to baseline, weaned off O2. Orthopnea resolved.  Ambulated in hall and felt symptoms were at baseline.    Symptoms worsened after starting Entresto.  Given advanced renal disease, Entresto stopped.   Mitral regurgitation - severe- (present on admission) Eccentric MR in setting of ischemic heart disease, prior LCx infarct. - Follow up with Cardiology Dr. Otho Perl for ?Mitraclip, ?need for RHC, and ?need for cardiac resynchronization  Acute respiratory failure with hypoxia ([place])- (present on admission) Resolved.  Chronic kidney disease stage IV Baseline Cr appears to be 2.5-3.2.  Here, Cr remained in that range with diuresis.    Entresto stopped.   Presence of cardiac pacemaker - placed 07-29-2019. medtronic.- (present on admission)  CAD in native artery- (present on admission)  Acquired thrombophilia ([place])- (present on admission) History of DVT INR supratherapeutic at admission.  Warfarin held and INR drifting down.  Recommend close follow up INR in 2-3 days and adjustment of warfarin.     Chronic atrial fibrillation ([place])- (present on admission)  Morbid obesity ([place])- (present on admission) BMI 36      Pain control - [place] Controlled Substance Reporting System database was reviewed.   Consultants: Cardiology Procedures performed: Echocardiogram  Disposition: Home Diet recommendation: Cardiac diet  DISCHARGE MEDICATION: Allergies as of 05/02/2021       Reactions   Ezetimibe Hives   Zetia   Simvastatin Other (See Comments)   Other reaction(s): Myalgias (intolerance)  Niacin Rash        Medication List     TAKE these medications    Accu-Chek Guide test strip Generic drug: glucose blood USE 1 STRIP TO CHECK GLUCOSE TWICE DAILY   amiodarone 200 MG tablet Commonly known as: PACERONE Take 200 mg by mouth daily.   atorvastatin 40 MG tablet Commonly known as: LIPITOR Take 1  tablet by mouth once daily   carvedilol 3.125 MG tablet Commonly known as: COREG Take 3.125 mg by mouth 2 (two) times daily.   furosemide 40 MG tablet Commonly known as: LASIX Take 1.5 tablets (60 mg total) by mouth 2 (two) times daily. What changed: how much to take   isosorbide mononitrate 30 MG 24 hr tablet Commonly known as: IMDUR Take 30 mg by mouth daily.   Laxative 25 MG Tabs Generic drug: Sennosides Take 25 mg by mouth daily.   nitroGLYCERIN 0.4 MG SL tablet Commonly known as: NITROSTAT Place under the tongue.   ranolazine 500 MG 12 hr tablet Commonly known as: RANEXA Take 500 mg by mouth 2 (two) times daily.   warfarin 5 MG tablet Commonly known as: COUMADIN Take 5 mg by mouth See admin instructions. Take 5mg  by mouth once daily every Sunday, Monday, Tuesday, Thursday, Saturday   warfarin 2.5 MG tablet Commonly known as: COUMADIN Take 2.5 mg by mouth See admin instructions. Take 2.5mg  by mouth once daily on Wednesday and Friday        Follow-up Information     Lillard Anes, MD. Schedule an appointment as soon as possible for a visit in 3 day(s).   Specialty: Family Medicine Contact information: [address] [place] [address] [phone]         Flossie Buffy., MD. Schedule an appointment as soon as possible for a visit in 1 week(s).   Specialty: Cardiology Contact information: [place] [address] [phone]                Discharge Instructions     Diet - low sodium heart healthy   Complete by: As directed    Discharge instructions   Complete by: As directed    From Dr. Loleta Books: You were admitted for trouble breathing. Here, we found that this was from a flare of congestive heart failure. You were treated with diuretics and this improved.  Call Dr. Henrene Pastor and Dr. Otho Perl and schedule follow up appointments with both within 1 week  Call whoever checks your INR (warfarin level)  and have them check your INR on Wednesday. They can also check your kidney function at that time.  In the next week, until you see Dr. Otho Perl: Take furosemide 60 mg daily (this is 1 and 1/2 tabs twice daily, once in morning and once at night) When you see Dr. Otho Perl, have him check your kidney function and adjust your Lasix   ALSO: When you go home, do NOT take your warfarin tonight Tomorrow, take 2.5 mg only Take 2.5 mg nightly until you have your INR checked and your clinic tells you what to do.  Resume your other home medicines Weigh yourself daily   Increase activity slowly   Complete by: As directed        Discharge Exam: Filed Weights   04/30/21 2341 05/01/21 1044 05/02/21 0404  Weight: 127.9 kg 128.7 kg 126.3 kg   General: Pt is alert, awake, not in acute distress, sitting up in recliner Cardiovascular: RRR,  nl S1-S2, no murmurs appreciated.   Chronic venous stasis changes without pitting edema.   Respiratory: Normal respiratory rate and rhythm.  CTAB without rales or wheezes. Abdominal: Abdomen soft and non-tender.  No distension or HSM.   Neuro/Psych: Strength symmetric in upper and lower extremities.  Judgment and insight appear normal.   Condition at discharge: fair  The results of significant diagnostics from this hospitalization (including imaging, microbiology, ancillary and laboratory) are listed below for reference.   Imaging Studies: DG Chest Port 1 View  Result Date: 05/01/2021 CLINICAL DATA:  Shortness of breath for 2 days, initial encounter EXAM: PORTABLE CHEST 1 VIEW COMPARISON:  12/14/2020 FINDINGS: Cardiac shadow is enlarged. Pacing device is noted and stable. Increased vascular congestion is noted with mild interstitial edema. Chronic scarring along the minor fissure is noted. Mild bibasilar atelectasis is noted. No bony abnormality is seen. IMPRESSION: Vascular congestion and basilar atelectasis. Electronically Signed   By: Inez Catalina M.D.   On:  05/01/2021 00:50   ECHOCARDIOGRAM COMPLETE  Result Date: 05/01/2021    ECHOCARDIOGRAM REPORT   Patient Name:   DIAZ CRAGO Date of Exam: 05/01/2021 Medical Rec #:  [id number]          Height:       73.0 in Accession #:    [id number]         Weight:       282.0 lb Date of Birth:  Apr 09, 1946         BSA:          2.490 m Patient Age:    6 years           BP:           124/73 mmHg Patient Gender: M                  HR:           87 bpm. Exam Location:  Inpatient Procedure: 2D Echo, Color Doppler, Cardiac Doppler and Intracardiac            Opacification Agent Indications:    Congestive Heart Failure I50.9  History:        Patient has no prior history of Echocardiogram examinations.                 Arrythmias:Atrial Fibrillation; Risk Factors:Hypertension,                 Diabetes and Dyslipidemia.  Sonographer:    Bernadene Person RDCS Referring Phys: [place]  1. Left ventricular ejection fraction, by estimation, is 25 to 30%. The left ventricle has severely decreased function. The left ventricle demonstrates regional wall motion abnormalities (see scoring diagram/findings for description). The left ventricular internal cavity size was moderately dilated. There is mild left ventricular hypertrophy. Left ventricular diastolic parameters are indeterminate.  2. Right ventricular systolic function is normal. The right ventricular size is normal. There is moderately elevated pulmonary artery systolic pressure. The estimated right ventricular systolic pressure is 47.4 mmHg.  3. Left atrial size was moderately dilated.  4. Right atrial size was mildly dilated.  5. The mitral valve is abnormal. Severe mitral valve regurgitation, posteriorly directed.  6. Tricuspid valve regurgitation is mild to moderate.  7. The aortic valve is tricuspid. There is mild calcification of the aortic valve. Aortic valve regurgitation is mild. Aortic valve sclerosis/calcification is present, without any evidence of  aortic stenosis. Aortic regurgitation PHT measures 591 msec.  8. Aortic dilatation noted.  There is borderline dilatation of the aortic root, measuring 40 mm.  9. The inferior vena cava is dilated in size with <50% respiratory variability, suggesting right atrial pressure of 15 mmHg. Comparison(s): Prior images unable to be directly viewed. FINDINGS  Left Ventricle: Left ventricular ejection fraction, by estimation, is 25 to 30%. The left ventricle has severely decreased function. The left ventricle demonstrates regional wall motion abnormalities. The left ventricular internal cavity size was moderately dilated. There is mild left ventricular hypertrophy. Abnormal (paradoxical) septal motion, consistent with RV pacemaker. Left ventricular diastolic parameters are indeterminate.  LV Wall Scoring: The entire inferior wall, posterior wall, mid inferoseptal segment, and basal inferoseptal segment are akinetic. The entire anterior wall, antero-lateral wall, entire anterior septum, apical lateral segment, and apex are hypokinetic. Right Ventricle: The right ventricular size is normal. No increase in right ventricular wall thickness. Right ventricular systolic function is normal. There is moderately elevated pulmonary artery systolic pressure. The tricuspid regurgitant velocity is 3.15 m/s, and with an assumed right atrial pressure of 15 mmHg, the estimated right ventricular systolic pressure is 79.0 mmHg. Left Atrium: Left atrial size was moderately dilated. Right Atrium: Right atrial size was mildly dilated. Pericardium: There is no evidence of pericardial effusion. Mitral Valve: The mitral valve is abnormal. Severe mitral valve regurgitation, with posteriorly-directed jet. Tricuspid Valve: The tricuspid valve is grossly normal. Tricuspid valve regurgitation is mild to moderate. Aortic Valve: The aortic valve is tricuspid. There is mild calcification of the aortic valve. There is mild aortic valve annular calcification.  Aortic valve regurgitation is mild. Aortic regurgitation PHT measures 591 msec. Aortic valve sclerosis/calcification is present, without any evidence of aortic stenosis. Pulmonic Valve: The pulmonic valve was not well visualized. Pulmonic valve regurgitation is trivial. Aorta: Aortic dilatation noted. There is borderline dilatation of the aortic root, measuring 40 mm. Venous: The inferior vena cava is dilated in size with less than 50% respiratory variability, suggesting right atrial pressure of 15 mmHg. IAS/Shunts: No atrial level shunt detected by color flow Doppler. Additional Comments: A device lead is visualized.  LEFT VENTRICLE PLAX 2D LVIDd:         6.60 cm LVIDs:         5.80 cm LV PW:         1.20 cm LV IVS:        1.20 cm LVOT diam:     2.30 cm LV SV:         74 LV SV Index:   30 LVOT Area:     4.15 cm  LV Volumes (MOD) LV vol d, MOD A2C: 277.0 ml LV vol d, MOD A4C: 257.0 ml LV vol s, MOD A2C: 154.0 ml LV vol s, MOD A4C: 167.0 ml LV SV MOD A2C:     123.0 ml LV SV MOD A4C:     257.0 ml LV SV MOD BP:      115.3 ml RIGHT VENTRICLE TAPSE (M-mode): 1.3 cm LEFT ATRIUM              Index        RIGHT ATRIUM           Index LA diam:        5.30 cm  2.13 cm/m   RA Area:     25.40 cm LA Vol (A2C):   133.0 ml 53.42 ml/m  RA Volume:   81.70 ml  32.81 ml/m LA Vol (A4C):   120.0 ml 48.19 ml/m LA Biplane Vol: 127.0 ml 51.01  ml/m  AORTIC VALVE LVOT Vmax:         91.83 cm/s LVOT Vmean:        55.967 cm/s LVOT VTI:          0.178 m AI PHT:            591 msec AR Vena Contracta: 0.20 cm  AORTA Ao Root diam: 4.00 cm Ao Asc diam:  3.80 cm MR Peak grad:    80.1 mmHg    TRICUSPID VALVE MR Mean grad:    49.5 mmHg    TR Peak grad:   39.7 mmHg MR Vmax:         447.50 cm/s  TR Vmax:        315.00 cm/s MR Vmean:        329.5 cm/s MR PISA:         1.57 cm     SHUNTS MR PISA Eff ROA: 12 mm       Systemic VTI:  0.18 m MR PISA Radius:  0.50 cm      Systemic Diam: 2.30 cm Rozann Lesches MD Electronically signed by Rozann Lesches MD Signature Date/Time: 05/01/2021/12:51:35 PM    Final     Microbiology: Results for orders placed or performed during the hospital encounter of 04/30/21  Resp Panel by RT-PCR (Flu A&B, Covid) Urine, Clean Catch     Status: None   Collection Time: 05/01/21 12:23 AM   Specimen: Urine, Clean Catch; Nasopharyngeal(NP) swabs in vial transport medium  Result Value Ref Range Status   SARS Coronavirus 2 by RT PCR NEGATIVE NEGATIVE Final    Comment: (NOTE) SARS-CoV-2 target nucleic acids are NOT DETECTED.  The SARS-CoV-2 RNA is generally detectable in upper respiratory specimens during the acute phase of infection. The lowest concentration of SARS-CoV-2 viral copies this assay can detect is 138 copies/mL. A negative result does not preclude SARS-Cov-2 infection and should not be used as the sole basis for treatment or other patient management decisions. A negative result may occur with  improper specimen collection/handling, submission of specimen other than nasopharyngeal swab, presence of viral mutation(s) within the areas targeted by this assay, and inadequate number of viral copies(<138 copies/mL). A negative result must be combined with clinical observations, patient history, and epidemiological information. The expected result is Negative.  Fact Sheet for Patients:  EntrepreneurPulse.com.au  Fact Sheet for Healthcare Providers:  IncredibleEmployment.be  This test is no t yet approved or cleared by the [place] FDA and  has been authorized for detection and/or diagnosis of SARS-CoV-2 by FDA under an Emergency Use Authorization (EUA). This EUA will remain  in effect (meaning this test can be used) for the duration of the COVID-19 declaration under Section 564(b)(1) of the Act, 21 U.S.C.section 360bbb-3(b)(1), unless the authorization is terminated  or revoked sooner.       Influenza A by PCR NEGATIVE NEGATIVE Final   Influenza B  by PCR NEGATIVE NEGATIVE Final    Comment: (NOTE) The Xpert Xpress SARS-CoV-2/FLU/RSV plus assay is intended as an aid in the diagnosis of influenza from Nasopharyngeal swab specimens and should not be used as a sole basis for treatment. Nasal washings and aspirates are unacceptable for Xpert Xpress SARS-CoV-2/FLU/RSV testing.  Fact Sheet for Patients: EntrepreneurPulse.com.au  Fact Sheet for Healthcare Providers: IncredibleEmployment.be  This test is not yet approved or cleared by the [place] FDA and has been authorized for detection and/or diagnosis of SARS-CoV-2 by FDA under an Emergency Use Authorization (EUA). This  EUA will remain in effect (meaning this test can be used) for the duration of the COVID-19 declaration under Section 564(b)(1) of the Act, 21 U.S.C. section 360bbb-3(b)(1), unless the authorization is terminated or revoked.  Performed at [facility], [place] [address], [place], [place] [address]     Labs: CBC: Recent Labs  Lab 05/01/21 0205  WBC 7.4  NEUTROABS 5.6  HGB 10.9*  HCT 35.1*  MCV 90.2  PLT 462*   Basic Metabolic Panel: Recent Labs  Lab 05/01/21 0205 05/02/21 0347  NA 139 141  K 3.0* 3.5  CL 101 100  CO2 29 31  GLUCOSE 119* 105*  BUN 43* 47*  CREATININE 2.43* 2.89*  CALCIUM 8.5* 8.7*  MG  --  2.4   Liver Function Tests: No results for input(s): AST, ALT, ALKPHOS, BILITOT, PROT, ALBUMIN in the last 168 hours. CBG: Recent Labs  Lab 05/02/21 0444  GLUCAP 100*    Discharge time spent: greater than 30 minutes.  Signed:  Edwin Dada MD.  [facility] 05/02/2021

## 2021-05-02 NOTE — TOC CM/SW Note (Signed)
°  Transition of Care [facility]) Screening Note   Patient Details  Name: DAIVIK OVERLEY Date of Birth: 11-19-1945   [facility] CM/SW Contact:    Ross Ludwig, LCSW Phone Number: 05/02/2021, 12:15 PM    Transition of Care Department [facility]) has reviewed patient and no TOC needs have been identified at this time. We will continue to monitor patient advancement through interdisciplinary progression rounds. If new patient transition needs arise, please place a TOC consult.

## 2021-05-02 NOTE — Progress Notes (Signed)
ANTICOAGULATION CONSULT NOTE - Follow Up Consult  Pharmacy Consult for warfarin Indication: hx atrial fibrillation  Allergies  Allergen Reactions   Ezetimibe Hives    Zetia    Simvastatin Other (See Comments)    Other reaction(s): Myalgias (intolerance)   Niacin Rash    Patient Measurements: Height: 6\' 1"  (185.4 cm) Weight: 126.3 kg (278 lb 7.1 oz) IBW/kg (Calculated) : 79.9 Heparin Dosing Weight:   Vital Signs: Temp: 97.6 F (36.4 C) (12/26 0404) Temp Source: Oral (12/26 0404) BP: 111/66 (12/26 0404) Pulse Rate: 65 (12/26 0404)  Labs: Recent Labs    05/01/21 0205 05/02/21 0347  HGB 10.9*  --   HCT 35.1*  --   PLT 133*  --   LABPROT 43.0* 36.6*  INR 4.5* 3.7*  CREATININE 2.43* 2.89*  TROPONINIHS 29*  --     Estimated Creatinine Clearance: 29.8 mL/min (A) (by C-G formula based on SCr of 2.89 mg/dL (H)).   Medications:  - PTA warfarin regimen: 5mg  daily except 2.5 mg on Wed and Fri  Assessment: Patient is a 75 y.o M with hx afib on warfarin PTA presented to the ED on 12/24 with c/o SOB. Pharmacy has been consulted to manage pt's warfarin while he's hospitalized.  Today, 05/02/2021: - INR remains elevated at 3.7 with warfarin held since admission - last cbc on 12/25 ok - no bleeding documented - scr up 2.89 (crcl~30), on lasix 80mg  IV q12h - drug-drug intxns: amiodarone (pt was on this PTA)   Goal of Therapy:  INR 2-3 Monitor platelets by anticoagulation protocol: Yes   Plan:  - continue to hold warfarin today d/t supra-therapeutic INR - daily INR - monitor for s/sx bleeding  Kemauri Musa P 05/02/2021,9:51 AM

## 2021-05-02 NOTE — Care Management Obs Status (Signed)
[place] NOTIFICATION   Patient Details  Name: Steven Hester MRN: [id number] Date of Birth: Oct 05, 1945   Medicare Observation Status Notification Given:  Yes    Ross Ludwig, LCSW 05/02/2021, 11:06 AM

## 2021-05-03 ENCOUNTER — Ambulatory Visit (INDEPENDENT_AMBULATORY_CARE_PROVIDER_SITE_OTHER): Payer: Medicare Other

## 2021-05-03 VITALS — Wt 271.0 lb

## 2021-05-03 DIAGNOSIS — E782 Mixed hyperlipidemia: Secondary | ICD-10-CM

## 2021-05-03 DIAGNOSIS — E1151 Type 2 diabetes mellitus with diabetic peripheral angiopathy without gangrene: Secondary | ICD-10-CM

## 2021-05-03 DIAGNOSIS — I10 Essential (primary) hypertension: Secondary | ICD-10-CM

## 2021-05-03 DIAGNOSIS — I5020 Unspecified systolic (congestive) heart failure: Secondary | ICD-10-CM

## 2021-05-03 NOTE — Patient Instructions (Signed)
Visit Information  Thank you for taking time to visit with me today. Please don't hesitate to contact me if I can be of assistance to you before our next scheduled telephone appointment.  Following are the goals we discussed today:  Take medications as prescribed   Attend all scheduled provider appointments Call pharmacy for medication refills 3-7 days in advance of running out of medications Call provider office for new concerns or questions  call office if I gain more than 2 pounds in one day or 5 pounds in one week do ankle pumps when sitting keep legs up while sitting watch for swelling in feet, ankles and legs every day weigh myself daily follow rescue plan if symptoms flare-up take the blood sugar log to all doctor visits take the blood sugar meter to all doctor visits check blood pressure daily when I get a machine choose a place to take my blood pressure (home, clinic or office, retail store) write blood pressure results in a log or diary keep a blood pressure log take blood pressure log to all doctor appointments call doctor for signs and symptoms of high blood pressure keep all doctor appointments take medications for blood pressure exactly as prescribed report new symptoms to your doctor Order BP machine and start checking BP when machine comes  Our next appointment is by telephone on 06/02/2021 at 1045  Please call the care guide team at [phone] if you need to cancel or reschedule your appointment.   If you are experiencing a Mental Health or [place] or need someone to talk to, please call the Suicide and Crisis Lifeline: 988 call the [place] National Suicide Prevention Lifeline: 682 704 9140 or TTY: 351-188-8572 TTY 873-344-6167) to talk to a trained counselor call 1-800-273-TALK (toll free, 24 hour hotline) call 911   The patient verbalized understanding of instructions, educational materials, and care plan provided today and agreed to receive  a mailed copy of patient instructions, educational materials, and care plan.   Tomasa Rand RN, BSN, CEN RN Case Freight forwarder - [employer]/gallery exhibitions officer Mobile: [phone]

## 2021-05-03 NOTE — Chronic Care Management (AMB) (Signed)
Chronic Care Management   CCM RN Visit Note  05/03/2021 Name: Steven Hester MRN: [id number] DOB: Oct 12, 1945  Subjective: Steven Hester is a 75 y.o. year old male who is a primary care patient of Lillard Anes, MD. The care management team was consulted for assistance with disease management and care coordination needs.    Engaged with patient by telephone for follow up visit in response to provider referral for case management and/or care coordination services.   Consent to Services:  The patient was given information about Chronic Care Management services, agreed to services, and gave verbal consent prior to initiation of services.  Please see initial visit note for detailed documentation.   Patient agreed to services and verbal consent obtained.   Assessment: Review of patient past medical history, allergies, medications, health status, including review of consultants reports, laboratory and other test data, was performed as part of comprehensive evaluation and provision of chronic care management services.   SDOH (Social Determinants of Health) assessments and interventions performed:    CCM Care Plan  Allergies  Allergen Reactions   Ezetimibe Hives    Zetia    Simvastatin Other (See Comments)    Other reaction(s): Myalgias (intolerance)   Niacin Rash    Outpatient Encounter Medications as of 05/03/2021  Medication Sig   ACCU-CHEK GUIDE test strip USE 1 STRIP TO CHECK GLUCOSE TWICE DAILY   amiodarone (PACERONE) 200 MG tablet Take 200 mg by mouth daily.   atorvastatin (LIPITOR) 40 MG tablet Take 1 tablet by mouth once daily   carvedilol (COREG) 3.125 MG tablet Take 3.125 mg by mouth 2 (two) times daily.   furosemide (LASIX) 40 MG tablet Take 1.5 tablets (60 mg total) by mouth 2 (two) times daily.   isosorbide mononitrate (IMDUR) 30 MG 24 hr tablet Take 30 mg by mouth daily.   ranolazine (RANEXA) 500 MG 12 hr tablet Take 500 mg by mouth 2 (two) times  daily.   Sennosides (LAXATIVE) 25 MG TABS Take 25 mg by mouth daily.   warfarin (COUMADIN) 2.5 MG tablet Take 2.5 mg by mouth See admin instructions. Take 2.74m by mouth once daily on Wednesday and Friday   warfarin (COUMADIN) 5 MG tablet Take 5 mg by mouth See admin instructions. Take 556mby mouth once daily every Sunday, Monday, Tuesday, Thursday, Saturday   nitroGLYCERIN (NITROSTAT) 0.4 MG SL tablet Place under the tongue. (Patient not taking: Reported on 05/01/2021)   No facility-administered encounter medications on file as of 05/03/2021.    Patient Active Problem List   Diagnosis Date Noted   Acute on chronic systolic CHF (congestive heart failure) (HCVandervoort12/25/2022   Cardiorenal syndrome, stage 1-4 or unspecified chronic kidney disease, with heart failure (HCCochranville12/25/2022   Hypokalemia 05/01/2021   Acquired thrombophilia (HCWaldron08/24/2022   Pleural effusion 12/14/2020   Mitral regurgitation - severe 12/02/2020   Warfarin anticoagulation 11/19/2020   HFrEF (heart failure with reduced ejection fraction) (HCReeves06/15/2022   Mild protein-calorie malnutrition (HCOglethor[place]05/07/2020   CKD (chronic kidney disease) stage 5, GFR less than 15 ml/min (HCTarkio04/27/2022   Acute respiratory failure with hypoxia (HCDunbar04/23/2022   Calculus of gallbladder with other cholecystitis without obstruction 08/28/2020   Obstructive sleep apnea 08/28/2020   Acute kidney failure, unspecified (HCMonument04/04/2021   Allergy, unspecified, initial encounter 08/17/2020   Anemia due to stage 4 chronic kidney disease (HCWoodruff04/04/2021   Angina pectoris, unspecified (HCRedgranite04/04/2021   Iron deficiency anemia, unspecified 08/17/2020   Secondary  hyperparathyroidism of renal origin ([place]) 08/17/2020   Acute embolism and thrombosis of unspecified deep veins of unspecified lower extremity ([place]) 07/21/2020   Cardiomyopathy, unspecified ([place]) 07/21/2020   Other complete intestinal obstruction ([place]) 07/21/2020   Perforation of  intestine (nontraumatic) ([place]) 07/21/2020   Routine general medical examination at a health care facility 05/04/2020   BMI 36.0-36.9,adult 12/29/2019   Morbid obesity ([place]) 12/29/2019   Other primary thrombocytopenia ([place]) 08/27/2019   Diabetes mellitus type 2 with peripheral artery disease (HCC)    Mixed hyperlipidemia    Essential hypertension    Chronic atrial fibrillation ([place])    Presence of cardiac pacemaker - placed 07-29-2019. medtronic. 07/29/2019   Bradycardia 05/16/2019   Malaise and fatigue 05/16/2019   Chronic hypoxemic respiratory failure ([place]) 02/17/2019   SOB (shortness of breath) 02/17/2019   Current use of long term anticoagulation 06/27/2017   Swelling of both ankles 06/27/2017   CAD in native artery 12/27/2016   H/O heart artery stent 12/27/2016   RBBB (right bundle branch block with left anterior fascicular block) 12/27/2016    Conditions to be addressed/monitored:CHF, HTN, and DMII   Patient Care Plan: RN Care Manager Plan of Care     Problem Identified: No plan of care established for mangement of chronic disease states ( Hypertension, Dm, heart failure)   Priority: High     Long-Range Goal: Development of plan of care for chronic disease management ( Hypertension, DM, heart failure)   Start Date: 05/03/2021  Expected End Date: 05/03/2022  Priority: High  Note:   Current Barriers:  Chronic Disease Management support and education needs related to CHF, HTN, and DMII 05/03/2021  Discharged home from hospital last night with CHF. Diuresed  2.7 liters. Patient reports weight today of 271 pounds.  Reports he feels good. No swelling today. CBG today of 96.  Reports he is going to get a BP monitor from [facility] after the new year.  Reports understanding of his discharge medications and instructions. Reports he is aware of his follow up appointments scheduled for 05/04/2021.   RNCM Clinical Goal(s):  Patient will verbalize basic understanding of CHF, HTN, and DMII  disease process and self health management plan as evidenced by patient report demonstrate improved adherence to prescribed treatment plan for CHF, HTN, and DMII as evidenced by reviewed of EMR and patient report  through collaboration with RN Care manager, provider, and care team.   Interventions: 1:1 collaboration with primary care provider regarding development and update of comprehensive plan of care as evidenced by provider attestation and co-signature Inter-disciplinary care team collaboration (see longitudinal plan of care) Evaluation of current treatment plan related to  self management and patient's adherence to plan as established by provider   Heart Failure Interventions:  (Status: New goal.)  Long Term Goal   Weight: 271 lb (122.9 kg)   Provided education on low sodium diet Reviewed Heart Failure Action Plan in depth and provided written copy Provided education about placing scale on hard, flat surface Advised patient to weigh each morning after emptying bladder Discussed importance of daily weight and advised patient to weigh and record daily Reviewed role of diuretics in prevention of fluid overload and management of heart failure Discussed the importance of keeping all appointments with provider Encouraged patient to attend his follow up appointment with MD tomorrow. Reviewed discharge instructions and medications changes  Diabetes:  (Status: New goal.) Long Term Goal   Lab Results  Component Value Date   HGBA1C 6.1 (H) 12/29/2020  Assessed patient's understanding of A1c goal: <6.5% Reviewed medications with patient and discussed importance of medication adherence;        Counseled on importance of regular laboratory monitoring as prescribed;        Discussed plans with patient for ongoing care management follow up and provided patient with direct contact information for care management team;      Reviewed scheduled/upcoming provider appointments including: PCP and  cardiology;         Advised patient, providing education and rationale, to check cbg once daily and when you have symptoms of low or high blood sugar and record        Review of patient status, including review of consultants reports, relevant laboratory and other test results, and medications completed;        Hypertension: (Status: New goal.) Long Term Goal  Last practice recorded BP readings:  BP Readings from Last 3 Encounters:  05/02/21 111/66  12/29/20 100/60  12/21/20 110/60  Most recent eGFR/CrCl:  Lab Results  Component Value Date   EGFR 19 (L) 12/29/2020    No components found for: CRCL  Provided education to patient re: stroke prevention, s/s of heart attack and stroke; Reviewed prescribed diet low salt diet Reviewed medications with patient and discussed importance of compliance;  Provided assistance with obtaining home blood pressure monitor via reviewed with patient the quarterly allowance from [facility]. Patient reports he will get a BP monitor 05/08/2021 when his new allowance amount of 50.00 kicks in. ;   Patient Goals/Self-Care Activities: Take medications as prescribed   Attend all scheduled provider appointments Call pharmacy for medication refills 3-7 days in advance of running out of medications Call provider office for new concerns or questions  call office if I gain more than 2 pounds in one day or 5 pounds in one week do ankle pumps when sitting keep legs up while sitting watch for swelling in feet, ankles and legs every day weigh myself daily follow rescue plan if symptoms flare-up take the blood sugar log to all doctor visits take the blood sugar meter to all doctor visits check blood pressure daily when I get a machine choose a place to take my blood pressure (home, clinic or office, retail store) write blood pressure results in a log or diary keep a blood pressure log take blood pressure log to all doctor appointments call doctor for signs  and symptoms of high blood pressure keep all doctor appointments take medications for blood pressure exactly as prescribed report new symptoms to your doctor Order BP machine and start checking BP when machine comes            Plan:Telephone follow up appointment with care management team member scheduled for:  06/02/2021  at 10:45 Tomasa Rand RN, BSN, CEN RN Case Manager - [facility] [employer]/gallery exhibitions officer Mobile: [phone]

## 2021-05-04 ENCOUNTER — Encounter: Payer: Self-pay | Admitting: Legal Medicine

## 2021-05-04 ENCOUNTER — Ambulatory Visit: Payer: Medicare Other | Admitting: Legal Medicine

## 2021-05-04 ENCOUNTER — Other Ambulatory Visit: Payer: Self-pay

## 2021-05-04 ENCOUNTER — Ambulatory Visit (INDEPENDENT_AMBULATORY_CARE_PROVIDER_SITE_OTHER): Payer: Medicare Other | Admitting: Legal Medicine

## 2021-05-04 VITALS — BP 100/70 | HR 85 | Temp 96.0°F | Resp 16 | Ht 73.0 in | Wt 274.0 lb

## 2021-05-04 DIAGNOSIS — N2581 Secondary hyperparathyroidism of renal origin: Secondary | ICD-10-CM | POA: Diagnosis not present

## 2021-05-04 DIAGNOSIS — E876 Hypokalemia: Secondary | ICD-10-CM | POA: Diagnosis not present

## 2021-05-04 DIAGNOSIS — E441 Mild protein-calorie malnutrition: Secondary | ICD-10-CM

## 2021-05-04 DIAGNOSIS — I5023 Acute on chronic systolic (congestive) heart failure: Secondary | ICD-10-CM | POA: Diagnosis not present

## 2021-05-04 DIAGNOSIS — Z7901 Long term (current) use of anticoagulants: Secondary | ICD-10-CM | POA: Diagnosis not present

## 2021-05-04 DIAGNOSIS — I130 Hypertensive heart and chronic kidney disease with heart failure and stage 1 through stage 4 chronic kidney disease, or unspecified chronic kidney disease: Secondary | ICD-10-CM

## 2021-05-04 DIAGNOSIS — I4819 Other persistent atrial fibrillation: Secondary | ICD-10-CM | POA: Diagnosis not present

## 2021-05-04 NOTE — Progress Notes (Signed)
Subjective:  Patient ID: Steven Hester, male    DOB: 05/02/1946  Age: 75 y.o. MRN: [id number]  Chief Complaint  Patient presents with   Transitions Of Care   Congestive Heart Failure   Atrial Fibrillation    HPI Patient was admitted at [facility] from 04/30/2021 to 05/02/2021. With diagnosis of acute on chronic systolic congestive heart failure, hypokalemia, kidney failure. EF 30%.  He was diuresed 2.7 liters. No DOE.  Strength improving.  Atrial fibrillation under under.  Point from hunger and able to move much better.  No orthopnea or PND minimal ankle edema.  He is feeling much better after his hospitalization and his thoracentesis.  He continues to be on treatment for his diabetes and we will watch closely with his atrial fibrillation.  Current Outpatient Medications on File Prior to Visit  Medication Sig Dispense Refill   ACCU-CHEK GUIDE test strip USE 1 STRIP TO CHECK GLUCOSE TWICE DAILY 100 each 0   amiodarone (PACERONE) 200 MG tablet Take 200 mg by mouth daily.     atorvastatin (LIPITOR) 40 MG tablet Take 1 tablet by mouth once daily 90 tablet 2   carvedilol (COREG) 3.125 MG tablet Take 3.125 mg by mouth 2 (two) times daily.     furosemide (LASIX) 40 MG tablet Take 1.5 tablets (60 mg total) by mouth 2 (two) times daily. 30 tablet 0   isosorbide mononitrate (IMDUR) 30 MG 24 hr tablet Take 30 mg by mouth daily.     nitroGLYCERIN (NITROSTAT) 0.4 MG SL tablet Place under the tongue.     ranolazine (RANEXA) 500 MG 12 hr tablet Take 500 mg by mouth 2 (two) times daily.     Sennosides (LAXATIVE) 25 MG TABS Take 25 mg by mouth daily.     warfarin (COUMADIN) 2.5 MG tablet Take 2.5 mg by mouth See admin instructions. Take 2.5mg  by mouth once daily on Wednesday and Friday     warfarin (COUMADIN) 5 MG tablet Take 5 mg by mouth See admin instructions. Take 5mg  by mouth once daily every Sunday, Monday, Tuesday, Thursday, Saturday     No current facility-administered medications on  file prior to visit.   Past Medical History:  Diagnosis Date   CAD (coronary artery disease)    DES to RCA, circumflex, OM in [place]   CKD (chronic kidney disease) stage 4, GFR 15-29 ml/min (HCC)    Essential hypertension    Inferior myocardial infarction (HCC)    Ischemic cardiomyopathy    Mitral regurgitation    Mixed hyperlipidemia    Pacemaker    Medtronic   Persistent atrial fibrillation (HCC)    Sinus node dysfunction (HCC)    Type 2 diabetes mellitus ([place])    Past Surgical History:  Procedure Laterality Date   COLON SURGERY     CORONARY ANGIOPLASTY WITH STENT PLACEMENT     PACEMAKER INSERTION      Family History  Problem Relation Age of Onset   Heart attack Mother    GI Disease Father    Cancer Father    Social History   Socioeconomic History   Marital status: Married    Spouse name: Silva Bandy   Number of children: 1   Years of education: Not on file   Highest education level: Not on file  Occupational History   Not on file  Tobacco Use   Smoking status: Former    Packs/day: 1.00    Years: 10.00    Pack years: 10.00  Types: Cigarettes   Smokeless tobacco: Never  Substance and Sexual Activity   Alcohol use: Yes    Comment: occaionally    Drug use: Never   Sexual activity: Not Currently    Partners: Female  Other Topics Concern   Not on file  Social History Narrative   Not on file   Social Determinants of Health   Financial Resource Strain: Low Risk    Difficulty of Paying Living Expenses: Not hard at all  Food Insecurity: No Food Insecurity   Worried About Charity fundraiser in the Last Year: Never true   [place] in the Last Year: Never true  Transportation Needs: No Transportation Needs   Lack of Transportation (Medical): No   Lack of Transportation (Non-Medical): No  Physical Activity: Insufficiently Active   Days of Exercise per Week: 2 days   Minutes of Exercise per Session: 30 min  Stress: No Stress Concern  Present   Feeling of Stress : Not at all  Social Connections: Moderately Isolated   Frequency of Communication with Friends and Family: Three times a week   Frequency of Social Gatherings with Friends and Family: Never   Attends Religious Services: Never   Marine scientist or Organizations: No   Attends Music therapist: Never   Marital Status: Married    Review of Systems  Constitutional:  Negative for chills, fatigue, fever and unexpected weight change.  HENT:  Negative for congestion, ear pain, sinus pain and sore throat.   Respiratory:  Positive for cough and shortness of breath.   Cardiovascular:  Negative for chest pain and palpitations.  Gastrointestinal:  Negative for abdominal pain, blood in stool, constipation, diarrhea, nausea and vomiting.  Endocrine: Negative for polydipsia.  Genitourinary:  Negative for dysuria.  Musculoskeletal:  Negative for back pain.  Skin:  Negative for rash.  Neurological:  Negative for headaches.    Objective:  BP 100/70    Pulse 85    Temp (!) 96 F (35.6 C)    Resp 16    Ht 6\' 1"  (1.854 m)    Wt 274 lb (124.3 kg)    SpO2 98%    BMI 36.15 kg/m   BP/Weight 05/04/2021 05/03/2021 73/71/0626  Systolic BP 948 - 546  Diastolic BP 70 - 66  Wt. (Lbs) 274 271 278.44  BMI 36.15 35.75 -    Physical Exam Vitals reviewed.  Constitutional:      General: He is not in acute distress.    Appearance: Normal appearance. He is obese.  HENT:     Right Ear: Tympanic membrane normal.     Left Ear: Tympanic membrane normal.     Mouth/Throat:     Mouth: Mucous membranes are moist.     Pharynx: Oropharynx is clear.  Eyes:     Conjunctiva/sclera: Conjunctivae normal.     Pupils: Pupils are equal, round, and reactive to light.  Cardiovascular:     Rate and Rhythm: Normal rate and regular rhythm.     Pulses: Normal pulses.     Heart sounds: Normal heart sounds. No murmur heard.   No gallop.  Pulmonary:     Effort: Pulmonary effort  is normal. No respiratory distress.     Breath sounds: Normal breath sounds. No wheezing.  Abdominal:     General: Abdomen is flat. Bowel sounds are normal. There is no distension.     Palpations: Abdomen is soft.     Tenderness: There is no  abdominal tenderness.  Musculoskeletal:     Cervical back: Normal range of motion.     Right lower leg: No edema.     Left lower leg: No edema.  Skin:    General: Skin is warm.     Capillary Refill: Capillary refill takes less than 2 seconds.  Neurological:     General: No focal deficit present.     Mental Status: He is alert and oriented to person, place, and time. Mental status is at baseline.     Gait: Gait normal.     Deep Tendon Reflexes: Reflexes normal.  Psychiatric:        Mood and Affect: Mood normal.        Thought Content: Thought content normal.        Lab Results  Component Value Date   WBC 7.4 05/01/2021   HGB 10.9 (L) 05/01/2021   HCT 35.1 (L) 05/01/2021   PLT 133 (L) 05/01/2021   GLUCOSE 105 (H) 05/02/2021   CHOL 127 12/29/2020   TRIG 125 12/29/2020   HDL 31 (L) 12/29/2020   LDLCALC 73 12/29/2020   ALT 11 12/29/2020   AST 16 12/29/2020   NA 141 05/02/2021   K 3.5 05/02/2021   CL 100 05/02/2021   CREATININE 2.89 (H) 05/02/2021   BUN 47 (H) 05/02/2021   CO2 31 05/02/2021   INR 3.7 (H) 05/02/2021   HGBA1C 6.1 (H) 12/29/2020      Assessment & Plan:   Problem List Items Addressed This Visit       Cardiovascular and Mediastinum   Acute on chronic systolic CHF (congestive heart failure) ([place]) - Primary   Relevant Orders   Comprehensive metabolic panel An individualized care plan was established and reinforced.  The patient's disease status was assessed using clinical finding son exam today, labs, and/or other diagnostic testing such as x-rays, to determine the patient's success in meeting treatmentgoalsbased on disease-based guidelines and found to be improving. But not at goal yet. Medications prescriptions  no changes Laboratory tests ordered to be performed today include routine. RECOMMENDATIONS: given include see cardiology.  Call physician is patient gains 3 lbs in one day or 5 lbs for one week.  Call for progressive PND, orthopnea or increased pedal edema.     Cardiorenal syndrome, stage 1-4 or unspecified chronic kidney disease, with heart failure [facility]) Patient has stable renal failure     Endocrine   Secondary hyperparathyroidism of renal origin [facility]) Patient has hyperparathyroidism     Other   Current use of long term anticoagulation Patient is on chronic anticoagulation for atrial fibrillation       Hypokalemia Hypokalemia in hospital, recheck potassium  . 30 minute visit and review of hospital records   Orders Placed This Encounter  Procedures   Comprehensive metabolic panel     Follow-up: Return in about 3 months (around 08/02/2021) for fasting.  An After Visit Summary was printed and given to the patient.  Reinaldo Meeker, MD [facility] [phone]

## 2021-05-05 ENCOUNTER — Ambulatory Visit (INDEPENDENT_AMBULATORY_CARE_PROVIDER_SITE_OTHER): Payer: Medicare Other

## 2021-05-05 VITALS — BP 108/62 | HR 89 | Resp 16 | Ht 73.0 in | Wt 272.4 lb

## 2021-05-05 DIAGNOSIS — Z0000 Encounter for general adult medical examination without abnormal findings: Secondary | ICD-10-CM

## 2021-05-05 DIAGNOSIS — J9611 Chronic respiratory failure with hypoxia: Secondary | ICD-10-CM | POA: Diagnosis not present

## 2021-05-05 LAB — COMPREHENSIVE METABOLIC PANEL WITH GFR
ALT: 16 IU/L (ref 0–44)
AST: 19 IU/L (ref 0–40)
Albumin/Globulin Ratio: 1.1 — ABNORMAL LOW (ref 1.2–2.2)
Albumin: 3.9 g/dL (ref 3.7–4.7)
Alkaline Phosphatase: 107 IU/L (ref 44–121)
BUN/Creatinine Ratio: 16 (ref 10–24)
BUN: 54 mg/dL — ABNORMAL HIGH (ref 8–27)
Bilirubin Total: 0.8 mg/dL (ref 0.0–1.2)
CO2: 29 mmol/L (ref 20–29)
Calcium: 9.2 mg/dL (ref 8.6–10.2)
Chloride: 94 mmol/L — ABNORMAL LOW (ref 96–106)
Creatinine, Ser: 3.41 mg/dL — ABNORMAL HIGH (ref 0.76–1.27)
Globulin, Total: 3.4 g/dL (ref 1.5–4.5)
Glucose: 110 mg/dL — ABNORMAL HIGH (ref 70–99)
Potassium: 3.5 mmol/L (ref 3.5–5.2)
Sodium: 141 mmol/L (ref 134–144)
Total Protein: 7.3 g/dL (ref 6.0–8.5)
eGFR: 18 mL/min/1.73 — ABNORMAL LOW

## 2021-05-05 NOTE — Patient Instructions (Signed)

## 2021-05-05 NOTE — Progress Notes (Signed)
Subjective:   Steven Hester is a 75 y.o. male who presents for Medicare Annual/Subsequent preventive examination.  This wellness visit is conducted by a nurse.  The patient's medications were reviewed and reconciled since the patient's last visit.  History details were provided by the patient.  The history appears to be reliable.    Patient's last AWV was one year ago.   Medical History: Patient history and Family history was reviewed  Medications, Allergies, and preventative health maintenance was reviewed and updated.   Review of Systems    ROS-Negative Cardiac Risk Factors include: advanced age (>43men, >72 women);diabetes mellitus;hypertension;obesity (BMI >30kg/m2);male gender     Objective:    Today's Vitals   05/05/21 0900  BP: 108/62  Pulse: 89  Resp: 16  SpO2: 97%  Weight: 272 lb 6.4 oz (123.6 kg)  Height: 6\' 1"  (1.854 m)  PainSc: 0-No pain   Body mass index is 35.94 kg/m.  Advanced Directives 05/05/2021 05/01/2021 01/20/2021 05/04/2020  Does Patient Have a Medical Advance Directive? No No No No  Would patient like information on creating a medical advance directive? No - Patient declined No - Patient declined No - Patient declined -    Current Medications (verified) Outpatient Encounter Medications as of 05/05/2021  Medication Sig   ACCU-CHEK GUIDE test strip USE 1 STRIP TO CHECK GLUCOSE TWICE DAILY   amiodarone (PACERONE) 200 MG tablet Take 200 mg by mouth daily.   atorvastatin (LIPITOR) 40 MG tablet Take 1 tablet by mouth once daily   carvedilol (COREG) 3.125 MG tablet Take 3.125 mg by mouth 2 (two) times daily.   furosemide (LASIX) 40 MG tablet Take 1.5 tablets (60 mg total) by mouth 2 (two) times daily.   isosorbide mononitrate (IMDUR) 30 MG 24 hr tablet Take 30 mg by mouth daily.   nitroGLYCERIN (NITROSTAT) 0.4 MG SL tablet Place under the tongue.   ranolazine (RANEXA) 500 MG 12 hr tablet Take 500 mg by mouth 2 (two) times daily.   Sennosides  (LAXATIVE) 25 MG TABS Take 25 mg by mouth daily.   warfarin (COUMADIN) 2.5 MG tablet Take 2.5 mg by mouth See admin instructions. Take 2.5mg  by mouth once daily on Wednesday and Friday   warfarin (COUMADIN) 5 MG tablet Take 5 mg by mouth See admin instructions. Take 5mg  by mouth once daily every Sunday, Monday, Tuesday, Thursday, Saturday   No facility-administered encounter medications on file as of 05/05/2021.    Allergies (verified) Ezetimibe, Simvastatin, and Niacin   History: Past Medical History:  Diagnosis Date   CAD (coronary artery disease)    DES to RCA, circumflex, OM in [place]   CHF (congestive heart failure) (HCC)    CKD (chronic kidney disease) stage 4, GFR 15-29 ml/min (HCC)    Essential hypertension    Inferior myocardial infarction (HCC)    Ischemic cardiomyopathy    Mitral regurgitation    Mixed hyperlipidemia    Pacemaker    Medtronic   Persistent atrial fibrillation (HCC)    Sinus node dysfunction (HCC)    Type 2 diabetes mellitus ([place])    Past Surgical History:  Procedure Laterality Date   COLON SURGERY     CORONARY ANGIOPLASTY WITH STENT PLACEMENT     PACEMAKER INSERTION     Family History  Problem Relation Age of Onset   Heart attack Mother    GI Disease Father    Cancer Father    Social History   Socioeconomic History   Marital  status: Married    Spouse name: Steven Hester   Number of children: 1  Tobacco Use   Smoking status: Former    Packs/day: 1.00    Years: 10.00    Pack years: 10.00    Types: Cigarettes   Smokeless tobacco: Never  Vaping Use   Vaping Use: Never used  Substance and Sexual Activity   Alcohol use: Yes    Comment: occasionally   Drug use: Never   Sexual activity: Not Currently    Partners: Female   Social Determinants of Health   Financial Resource Strain: Not on file  Food Insecurity: No Food Insecurity   Worried About Charity fundraiser in the Last Year: Never true   Ran Out of Food in the Last  Year: Never true  Transportation Needs: No Transportation Needs   Lack of Transportation (Medical): No   Lack of Transportation (Non-Medical): No  Physical Activity: Not on file  Stress: Not on file  Social Connections: Not on file    Tobacco Counseling Counseling given: patient does not use tobacco products   Clinical Intake:  Pre-visit preparation completed: Yes Pain : No/denies pain Pain Score: 0-No pain   BMI - recorded: 35.94 Nutritional Status: BMI > 30  Obese Nutritional Risks: None Diabetes: Yes (last A1C 6.1) CBG done?: No Did pt. bring in CBG monitor from home?: No How often do you need to have someone help you when you read instructions, pamphlets, or other written materials from your doctor or pharmacy?: 1 - Never Interpreter Needed?: No   Activities of Daily Living In your present state of health, do you have any difficulty performing the following activities: 05/05/2021 05/01/2021  Hearing? N N  Vision? N N  Difficulty concentrating or making decisions? N N  Walking or climbing stairs? N N  Dressing or bathing? N N  Doing errands, shopping? N N  Preparing Food and eating ? N -  Using the Toilet? N -  In the past six months, have you accidently leaked urine? N -  Do you have problems with loss of bowel control? N -  Managing your Medications? N -  Managing your Finances? N -  Housekeeping or managing your Housekeeping? N -  Some recent data might be hidden    Patient Care Team: Lillard Anes, MD as PCP - General (Family Medicine) Flossie Buffy., MD as Referring Physician (Cardiology) Lauretta Grill, MD as Referring Physician (Ophthalmology) Thana Ates, RN as [facility] Management Ebbie Ridge, MD as Referring Physician (Cardiology)     Assessment:   This is a routine wellness examination for Steven Hester.  Hearing/Vision screen No results found.  Dietary issues and exercise activities discussed: Current  Exercise Habits: The patient does not participate in regular exercise at present, Exercise limited by: None identified  Depression Screen PHQ 2/9 Scores 05/05/2021 01/20/2021 12/14/2020 05/04/2020 08/27/2019  PHQ - 2 Score 0 0 0 0 0    Fall Risk Fall Risk  05/05/2021 01/20/2021 12/14/2020 05/04/2020 11/19/2019  Falls in the past year? 1 1 0 0 0  Number falls in past yr: 0 0 0 0 0  Injury with Fall? 1 1 0 0 0  Comment swollen knee (resolved) - - - -  Risk for fall due to : History of fall(s) History of fall(s) - No Fall Risks -  Follow up Falls evaluation completed;Falls prevention discussed - - - Falls evaluation completed    FALL RISK PREVENTION PERTAINING TO  THE HOME:  Any stairs in or around the home? Yes  If so, are there any without handrails? No  Home free of loose throw rugs in walkways, pet beds, electrical cords, etc? Yes  Adequate lighting in your home to reduce risk of falls? Yes   ASSISTIVE DEVICES UTILIZED TO PREVENT FALLS:  Life alert? No  Use of a cane, walker or w/c? No  Grab bars in the bathroom? Yes  Shower chair or bench in shower? No  Gait slow and steady without use of assistive device  Cognitive Function:     6CIT Screen 05/05/2021 05/04/2020  What Year? 0 points 0 points  What month? 0 points 0 points  What time? 0 points 0 points  Count back from 20 0 points 0 points  Months in reverse 4 points 4 points  Repeat phrase 0 points 0 points  Total Score 4 4    Immunizations Immunization History  Administered Date(s) Administered   Fluad Quad(high Dose 65+) 02/09/2020, 02/09/2021   Influenza Split 03/03/2019, 02/09/2020   Influenza, High Dose Seasonal PF 03/03/2019   Moderna Covid-19 Vaccine Bivalent Booster 34yrs & up 02/09/2021   Moderna SARS-COV2 Booster Vaccination 11/30/2020   Moderna Sars-Covid-2 Vaccination 06/14/2019, 07/21/2019, 03/31/2020, 11/30/2020   PPD Test 08/31/2020   Pneumococcal Conjugate-13 02/25/2015, 04/19/2017   Pneumococcal  Polysaccharide-23 03/10/2018   Tdap 04/19/2017    TDAP status: Up to date  Flu Vaccine status: Up to date  Pneumococcal vaccine status: Up to date  Covid-19 vaccine status: Completed vaccines  Screening Tests Health Maintenance  Topic Date Due   URINE MICROALBUMIN  Never done   Hepatitis C Screening  Never done   Zoster Vaccines- Shingrix (1 of 2) Never done   HEMOGLOBIN A1C  07/01/2021   OPHTHALMOLOGY EXAM  12/21/2021   FOOT EXAM  12/29/2021   TETANUS/TDAP  04/20/2027   Pneumonia Vaccine 75+ Years old  Completed   INFLUENZA VACCINE  Completed   COVID-19 Vaccine  Completed   HPV VACCINES  Aged Out    Health Maintenance  Health Maintenance Due  Topic Date Due   URINE MICROALBUMIN  Never done   Hepatitis C Screening  Never done   Zoster Vaccines- Shingrix (1 of 2) Never done    Colorectal cancer screening: No longer required.   Lung Cancer Screening: (Low Dose CT Chest recommended if Age 80-80 years, 30 pack-year currently smoking OR have quit w/in 15years.) does not qualify.   Additional Screening:  Vision Screening: Recommended annual ophthalmology exams for early detection of glaucoma and other disorders of the eye. Is the patient up to date with their annual eye exam?  Yes  Who is the provider or what is the name of the office in which the patient attends annual eye exams? [place] Screening: Recommended annual dental exams for proper oral hygiene    Plan:    1- Continue to monitor daily weights 2- Advance Directive - information given 3- Eat a diabetic/heart healthy diet and exercise daily  I have personally reviewed and noted the following in the patients chart:   Medical and social history Use of alcohol, tobacco or illicit drugs  Current medications and supplements including opioid prescriptions.  Functional ability and status Nutritional status Physical activity Advanced directives List of other physicians Hospitalizations,  surgeries, and ER visits in previous 12 months Vitals Screenings to include cognitive, depression, and falls Referrals and appointments  In addition, I have reviewed and discussed with patient certain preventive protocols,  quality metrics, and best practice recommendations. A written personalized care plan for preventive services as well as general preventive health recommendations were provided to patient.     Erie Noe, LPN   30/01/7948

## 2021-05-05 NOTE — Progress Notes (Signed)
Glucose 110, kidneys stage 4 to 5, liver tests normal, potassium 3.5 normal lp

## 2021-05-07 DIAGNOSIS — I10 Essential (primary) hypertension: Secondary | ICD-10-CM | POA: Diagnosis not present

## 2021-05-07 DIAGNOSIS — E1151 Type 2 diabetes mellitus with diabetic peripheral angiopathy without gangrene: Secondary | ICD-10-CM

## 2021-05-07 DIAGNOSIS — J9611 Chronic respiratory failure with hypoxia: Secondary | ICD-10-CM | POA: Diagnosis not present

## 2021-05-07 DIAGNOSIS — E782 Mixed hyperlipidemia: Secondary | ICD-10-CM

## 2021-05-07 DIAGNOSIS — I5020 Unspecified systolic (congestive) heart failure: Secondary | ICD-10-CM | POA: Diagnosis not present

## 2021-05-11 ENCOUNTER — Telehealth (INDEPENDENT_AMBULATORY_CARE_PROVIDER_SITE_OTHER): Payer: Medicare Other | Admitting: Legal Medicine

## 2021-05-11 ENCOUNTER — Encounter: Payer: Self-pay | Admitting: Legal Medicine

## 2021-05-11 DIAGNOSIS — U071 COVID-19: Secondary | ICD-10-CM | POA: Insufficient documentation

## 2021-05-11 MED ORDER — CHERATUSSIN AC 100-10 MG/5ML PO SOLN: 5.0000 mL | Freq: Three times a day (TID) | ORAL | 0 refills | Status: DC | PRN

## 2021-05-11 NOTE — Progress Notes (Signed)
Virtual Visit via Video Note   This visit type was conducted due to national recommendations for restrictions regarding the COVID-19 Pandemic (e.g. social distancing) in an effort to limit this patient's exposure and mitigate transmission in our community.  Due to his co-morbid illnesses, this patient is at least at moderate risk for complications without adequate follow up.  This format is felt to be most appropriate for this patient at this time.  All issues noted in this document were discussed and addressed.  A limited physical exam was performed with this format.  A verbal consent was obtained for the virtual visit.   Date:  05/11/2021   ID:  Steven Hester, DOB 09-26-1945, MRN [id number]  Patient Location: Home Provider Location: Office/Clinic  PCP:  Lillard Anes, MD   Evaluation Performed:  New Patient Evaluation  Chief Complaint:  covid History of Present Illness:    Steven Hester is a 76 y.o. male with covid. Has cough, aching, no medicines.  The patient does have symptoms concerning for COVID-19 infection (fever, chills, cough, or new shortness of breath).    Past Medical History:  Diagnosis Date   CAD (coronary artery disease)    DES to RCA, circumflex, OM in [place]   CHF (congestive heart failure) (HCC)    CKD (chronic kidney disease) stage 4, GFR 15-29 ml/min (HCC)    Essential hypertension    Inferior myocardial infarction (HCC)    Ischemic cardiomyopathy    Mitral regurgitation    Mixed hyperlipidemia    Pacemaker    Medtronic   Persistent atrial fibrillation (HCC)    Sinus node dysfunction (HCC)    Type 2 diabetes mellitus ([place])     Past Surgical History:  Procedure Laterality Date   COLON SURGERY     CORONARY ANGIOPLASTY WITH STENT PLACEMENT     PACEMAKER INSERTION      Family History  Problem Relation Age of Onset   Heart attack Mother    GI Disease Father    Cancer Father     Social History    Socioeconomic History   Marital status: Married    Spouse name: Silva Bandy   Number of children: 1   Years of education: Not on file   Highest education level: Not on file  Occupational History   Not on file  Tobacco Use   Smoking status: Former    Packs/day: 1.00    Years: 10.00    Pack years: 10.00    Types: Cigarettes   Smokeless tobacco: Never  Vaping Use   Vaping Use: Never used  Substance and Sexual Activity   Alcohol use: Yes    Comment: occasionally   Drug use: Never   Sexual activity: Not Currently    Partners: Female  Other Topics Concern   Not on file  Social History Narrative   Not on file   Social Determinants of Health   Financial Resource Strain: Not on file  Food Insecurity: No Food Insecurity   Worried About Running Out of Food in the Last Year: Never true   [place] in the Last Year: Never true  Transportation Needs: No Transportation Needs   Lack of Transportation (Medical): No   Lack of Transportation (Non-Medical): No  Physical Activity: Not on file  Stress: Not on file  Social Connections: Not on file  Intimate Partner Violence: Not At Risk   Fear of Current or Ex-Partner: No   Emotionally Abused: No  Physically Abused: No   Sexually Abused: No    Outpatient Medications Prior to Visit  Medication Sig Dispense Refill   ACCU-CHEK GUIDE test strip USE 1 STRIP TO CHECK GLUCOSE TWICE DAILY 100 each 0   amiodarone (PACERONE) 200 MG tablet Take 200 mg by mouth daily.     atorvastatin (LIPITOR) 40 MG tablet Take 1 tablet by mouth once daily 90 tablet 2   carvedilol (COREG) 3.125 MG tablet Take 3.125 mg by mouth 2 (two) times daily.     furosemide (LASIX) 40 MG tablet Take 1.5 tablets (60 mg total) by mouth 2 (two) times daily. 30 tablet 0   isosorbide mononitrate (IMDUR) 30 MG 24 hr tablet Take 30 mg by mouth daily.     nitroGLYCERIN (NITROSTAT) 0.4 MG SL tablet Place under the tongue.     ranolazine (RANEXA) 500 MG 12 hr tablet Take 500  mg by mouth 2 (two) times daily.     Sennosides (LAXATIVE) 25 MG TABS Take 25 mg by mouth daily.     warfarin (COUMADIN) 2.5 MG tablet Take 2.5 mg by mouth See admin instructions. Take 2.39m by mouth once daily on Wednesday and Friday     warfarin (COUMADIN) 5 MG tablet Take 5 mg by mouth See admin instructions. Take 586mby mouth once daily every Sunday, Monday, Tuesday, Thursday, Saturday     No facility-administered medications prior to visit.    Allergies:   Ezetimibe, Simvastatin, and Niacin   Social History   Tobacco Use   Smoking status: Former    Packs/day: 1.00    Years: 10.00    Pack years: 10.00    Types: Cigarettes   Smokeless tobacco: Never  Vaping Use   Vaping Use: Never used  Substance Use Topics   Alcohol use: Yes    Comment: occasionally   Drug use: Never     Review of Systems  Constitutional:  Negative for chills and fever.  HENT:  Positive for congestion.   Eyes:  Negative for redness.  Respiratory:  Positive for cough.   Cardiovascular:  Negative for palpitations and claudication.  Musculoskeletal:  Positive for myalgias.  Neurological:  Negative for headaches.    Labs/Other Tests and Data Reviewed:    Recent Labs: 05/01/2021: B Natriuretic Peptide 1,036.6; Hemoglobin 10.9; Platelets 133 05/02/2021: Magnesium 2.4 05/04/2021: ALT 16; BUN 54; Creatinine, Ser 3.41; Potassium 3.5; Sodium 141   Recent Lipid Panel Lab Results  Component Value Date/Time   CHOL 127 12/29/2020 09:38 AM   TRIG 125 12/29/2020 09:38 AM   HDL 31 (L) 12/29/2020 09:38 AM   CHOLHDL 4.1 12/29/2020 09:38 AM   LDLCALC 73 12/29/2020 09:38 AM    Wt Readings from Last 3 Encounters:  05/11/21 272 lb (123.4 kg)  05/05/21 272 lb 6.4 oz (123.6 kg)  05/04/21 274 lb (124.3 kg)     Objective:    Vital Signs:  Ht 6' 1"  (1.854 m)    Wt 272 lb (123.4 kg)    BMI 35.89 kg/m    Physical Exam reviewed  ASSESSMENT & PLAN:   Diagnoses and all orders for this visit: COVID -      guaiFENesin-codeine (CHERATUSSIN AC) 100-10 MG/5ML syrup; Take 5 mLs by mouth 3 (three) times daily as needed.  He is getting better from Covid, cheratussin for cough RENAL FUNCTION EGFR <30, PAXLOVID CONTRAINDICATED      COVID-19 Education: The signs and symptoms of COVID-19 were discussed with the patient and how to seek care for  testing (follow up with PCP or arrange E-visit). The importance of social distancing was discussed today.   I spent 20 minutes dedicated to the care of this patient on the date of this encounter to include face-to-face time with the patient, as well as:   Follow Up:  In Person prn  Signed, Reinaldo Meeker, MD  05/11/2021 3:25 PM    [place]

## 2021-05-18 ENCOUNTER — Other Ambulatory Visit: Payer: Self-pay | Admitting: Legal Medicine

## 2021-05-18 DIAGNOSIS — E1169 Type 2 diabetes mellitus with other specified complication: Secondary | ICD-10-CM

## 2021-05-26 DIAGNOSIS — G4733 Obstructive sleep apnea (adult) (pediatric): Secondary | ICD-10-CM | POA: Diagnosis not present

## 2021-05-27 ENCOUNTER — Telehealth: Payer: Self-pay

## 2021-05-27 ENCOUNTER — Other Ambulatory Visit: Payer: Self-pay | Admitting: Legal Medicine

## 2021-05-27 MED ORDER — ZOLPIDEM TARTRATE 5 MG PO TABS: 5.0000 mg | ORAL_TABLET | Freq: Every evening | ORAL | 1 refills | Status: DC | PRN

## 2021-05-27 NOTE — Telephone Encounter (Signed)
Pt aware.   Steven Hester 05/27/21 12:57 PM

## 2021-05-27 NOTE — Telephone Encounter (Signed)
I called in afew zopidem 5mg  for sleep lp

## 2021-05-27 NOTE — Telephone Encounter (Signed)
Pt left Vm for return call.   Returned call. Pt states since having COVID he has been unable to sleep at night. He denies any ongoing symptoms. He has not had this problem before, started with COVID. Requesting medication to try to help.   Please advise.    Steven Hester, [place] 05/27/21 9:42 AM

## 2021-05-30 DIAGNOSIS — I340 Nonrheumatic mitral (valve) insufficiency: Secondary | ICD-10-CM | POA: Diagnosis not present

## 2021-05-30 DIAGNOSIS — I10 Essential (primary) hypertension: Secondary | ICD-10-CM | POA: Diagnosis not present

## 2021-05-30 DIAGNOSIS — I110 Hypertensive heart disease with heart failure: Secondary | ICD-10-CM | POA: Diagnosis not present

## 2021-05-30 DIAGNOSIS — I5022 Chronic systolic (congestive) heart failure: Secondary | ICD-10-CM | POA: Diagnosis not present

## 2021-05-30 DIAGNOSIS — I255 Ischemic cardiomyopathy: Secondary | ICD-10-CM | POA: Diagnosis not present

## 2021-05-30 DIAGNOSIS — E785 Hyperlipidemia, unspecified: Secondary | ICD-10-CM | POA: Diagnosis not present

## 2021-05-30 DIAGNOSIS — I452 Bifascicular block: Secondary | ICD-10-CM | POA: Diagnosis not present

## 2021-05-30 DIAGNOSIS — I252 Old myocardial infarction: Secondary | ICD-10-CM | POA: Diagnosis not present

## 2021-05-30 DIAGNOSIS — I4819 Other persistent atrial fibrillation: Secondary | ICD-10-CM | POA: Diagnosis not present

## 2021-05-30 DIAGNOSIS — I420 Dilated cardiomyopathy: Secondary | ICD-10-CM | POA: Diagnosis not present

## 2021-05-30 DIAGNOSIS — Z955 Presence of coronary angioplasty implant and graft: Secondary | ICD-10-CM | POA: Diagnosis not present

## 2021-05-30 DIAGNOSIS — I2510 Atherosclerotic heart disease of native coronary artery without angina pectoris: Secondary | ICD-10-CM | POA: Diagnosis not present

## 2021-05-30 DIAGNOSIS — Z7901 Long term (current) use of anticoagulants: Secondary | ICD-10-CM | POA: Diagnosis not present

## 2021-05-30 DIAGNOSIS — Z45018 Encounter for adjustment and management of other part of cardiac pacemaker: Secondary | ICD-10-CM | POA: Diagnosis not present

## 2021-05-31 DIAGNOSIS — G4733 Obstructive sleep apnea (adult) (pediatric): Secondary | ICD-10-CM | POA: Diagnosis not present

## 2021-06-02 ENCOUNTER — Ambulatory Visit (INDEPENDENT_AMBULATORY_CARE_PROVIDER_SITE_OTHER): Payer: Medicare Other

## 2021-06-02 ENCOUNTER — Telehealth: Payer: Self-pay

## 2021-06-02 DIAGNOSIS — I10 Essential (primary) hypertension: Secondary | ICD-10-CM

## 2021-06-02 DIAGNOSIS — R0602 Shortness of breath: Secondary | ICD-10-CM

## 2021-06-02 DIAGNOSIS — N184 Chronic kidney disease, stage 4 (severe): Secondary | ICD-10-CM

## 2021-06-02 DIAGNOSIS — E1151 Type 2 diabetes mellitus with diabetic peripheral angiopathy without gangrene: Secondary | ICD-10-CM

## 2021-06-02 NOTE — Patient Instructions (Addendum)
Visit Information  Thank you for taking time to visit with me today. Please don't hesitate to contact me if I can be of assistance to you before our next scheduled telephone appointment.  Following are the goals we discussed today:  Take medications as prescribed   Attend all scheduled provider appointments Call pharmacy for medication refills 3-7 days in advance of running out of medications Call provider office for new concerns or questions  call office if I gain more than 2 pounds in one day or 5 pounds in one week do ankle pumps when sitting keep legs up while sitting watch for swelling in feet, ankles and legs every day weigh myself daily follow rescue plan if symptoms flare-up take the blood sugar log to all doctor visits take the blood sugar meter to all doctor visits check blood pressure daily when I get a machine choose a place to take my blood pressure (home, clinic or office, retail store) write blood pressure results in a log or diary keep a blood pressure log take blood pressure log to all doctor appointments call doctor for signs and symptoms of high blood pressure keep all doctor appointments take medications for blood pressure exactly as prescribed report new symptoms to your doctor Check to see what the problem is with your scale.  Start monitoring your blood pressure when you get your scale.   Our next appointment is by telephone on 06/16/2021 at 0945  Please call the care guide team at [phone] if you need to cancel or reschedule your appointment.   If you are experiencing a Mental Health or [place] or need someone to talk to, please call the Suicide and Crisis Lifeline: 988 call the [place] National Suicide Prevention Lifeline: (804)751-2446 or TTY: (417) 462-3692 TTY 8485956053) to talk to a trained counselor call 1-800-273-TALK (toll free, 24 hour hotline) call 911   The patient verbalized understanding of instructions, educational  materials, and care plan provided today and agreed to receive a mailed copy of patient instructions, educational materials, and care plan.   Tomasa Rand RN, BSN, CEN RN Case Freight forwarder - [employer] exhibitions officer Mobile: [phone]

## 2021-06-02 NOTE — Chronic Care Management (AMB) (Signed)
Chronic Care Management   CCM RN Visit Note  06/02/2021 Name: Steven Hester MRN: [id number] DOB: 1947-09-175  Subjective: Steven Hester is a 76 y.o. year old male who is a primary care patient of Lillard Anes, MD. The care management team was consulted for assistance with disease management and care coordination needs.    Engaged with patient by telephone for follow up visit in response to provider referral for case management and/or care coordination services.   Consent to Services:  The patient was given information about Chronic Care Management services, agreed to services, and gave verbal consent prior to initiation of services.  Please see initial visit note for detailed documentation.   Patient agreed to services and verbal consent obtained.   Assessment: Review of patient past medical history, allergies, medications, health status, including review of consultants reports, laboratory and other test data, was performed as part of comprehensive evaluation and provision of chronic care management services.   SDOH (Social Determinants of Health) assessments and interventions performed:    CCM Care Plan  Allergies  Allergen Reactions   Ezetimibe Hives    Zetia    Simvastatin Other (See Comments)    Other reaction(s): Myalgias (intolerance)   Niacin Rash    Outpatient Encounter Medications as of 06/02/2021  Medication Sig   ACCU-CHEK GUIDE test strip USE 1  TWICE DAILY   amiodarone (PACERONE) 200 MG tablet Take 200 mg by mouth daily.   atorvastatin (LIPITOR) 40 MG tablet Take 1 tablet by mouth once daily   carvedilol (COREG) 3.125 MG tablet Take 3.125 mg by mouth 2 (two) times daily.   furosemide (LASIX) 40 MG tablet Take 1.5 tablets (60 mg total) by mouth 2 (two) times daily.   guaiFENesin-codeine (CHERATUSSIN AC) 100-10 MG/5ML syrup Take 5 mLs by mouth 3 (three) times daily as needed.   isosorbide mononitrate (IMDUR) 30 MG 24 hr tablet Take 30 mg by mouth  daily.   nitroGLYCERIN (NITROSTAT) 0.4 MG SL tablet Place under the tongue.   ranolazine (RANEXA) 500 MG 12 hr tablet Take 500 mg by mouth 2 (two) times daily.   Sennosides (LAXATIVE) 25 MG TABS Take 25 mg by mouth daily.   warfarin (COUMADIN) 2.5 MG tablet Take 2.5 mg by mouth See admin instructions. Take 2.73m by mouth once daily on Wednesday and Friday   warfarin (COUMADIN) 5 MG tablet Take 5 mg by mouth See admin instructions. Take 519mby mouth once daily every Sunday, Monday, Tuesday, Thursday, Saturday   zolpidem (AMBIEN) 5 MG tablet Take 1 tablet (5 mg total) by mouth at bedtime as needed for sleep.   No facility-administered encounter medications on file as of 06/02/2021.    Patient Active Problem List   Diagnosis Date Noted   COVID 05/11/2021   Acute on chronic systolic CHF (congestive heart failure) (HCLaureles12/25/2022   Cardiorenal syndrome, stage 1-4 or unspecified chronic kidney disease, with heart failure (HCNorth[facility]12/25/2022   Hypokalemia 05/01/2021   Acquired thrombophilia (HCReile's Acres08/24/2022   Pleural effusion 12/14/2020   Moderate to severe mitral regurgitation 12/02/2020   Warfarin anticoagulation 11/19/2020   HFrEF (heart failure with reduced ejection fraction) (HCMystic Island06/15/2022   Mild protein-calorie malnutrition (HCFreep[place]05/07/2020   Stage 4 chronic kidney disease (HCKiron04/27/2022   Acute respiratory failure with hypoxia (HCGruetli-Laager04/23/2022   Calculus of gallbladder with other cholecystitis without obstruction 08/28/2020   Obstructive sleep apnea 08/28/2020   Acute kidney failure, unspecified (HCMornings[place]04/04/2021   Allergy, unspecified, initial encounter  08/17/2020   Anemia due to stage 4 chronic kidney disease ([place]) 08/17/2020   Ischemic dilated cardiomyopathy ([place]) 08/17/2020   Iron deficiency anemia, unspecified 08/17/2020   Secondary hyperparathyroidism of renal origin ([place]) 08/17/2020   Acute embolism and thrombosis of unspecified deep veins of unspecified lower extremity ([place])  07/21/2020   Cardiomyopathy, unspecified ([place]) 07/21/2020   Other complete intestinal obstruction ([place]) 07/21/2020   Perforation of intestine (nontraumatic) ([place]) 07/21/2020   Routine general medical examination at a health care facility 05/04/2020   BMI 36.0-36.9,adult 12/29/2019   Morbid obesity ([place]) 12/29/2019   Other primary thrombocytopenia ([place]) 08/27/2019   Diabetes mellitus type 2 with peripheral artery disease (HCC)    Mixed hyperlipidemia    Essential hypertension    Presence of cardiac pacemaker - placed 07-29-2019. medtronic. 07/29/2019   Bradycardia 05/16/2019   Malaise and fatigue 05/16/2019   Chronic hypoxemic respiratory failure ([place]) 02/17/2019   SOB (shortness of breath) 02/17/2019   Current use of long term anticoagulation 06/27/2017   Swelling of both ankles 06/27/2017   Persistent atrial fibrillation ([place]) 01/16/2017   CAD in native artery 12/27/2016   H/O heart artery stent 12/27/2016   RBBB (right bundle branch block with left anterior fascicular block) 12/27/2016    Conditions to be addressed/monitored:CHF, HTN, and DMII  Care Plan : RN Care Manager Plan of Care  Updates made by Thana Ates, RN since 06/02/2021 12:00 AM     Problem: No plan of care established for mangement of chronic disease states ( Hypertension, Dm, heart failure)   Priority: High     Long-Range Goal: Development of plan of care for chronic disease management ( Hypertension, DM, heart failure)   Start Date: 05/03/2021  Expected End Date: 05/03/2022  Priority: High  Note:   Current Barriers:  Chronic Disease Management support and education needs related to CHF, HTN, and DMII 05/03/2021  Discharged home from hospital last night with CHF. Diuresed  2.7 liters. Patient reports weight today of 271 pounds.  Reports he feels good. No swelling today. CBG today of 96.  Reports he is going to get a BP monitor from [facility] after the new year.  Reports understanding of his discharge medications  and instructions. Reports he is aware of his follow up appointments scheduled for 05/04/2021.  06/02/2021- Patient reports that he was up all night going to the bathroom to pee. Reports that he took a fluid pill.  Heart failure- Reports scale is not working, states he needs to check the battery.  Reports no swelling today. States that he is short of breath at times and uses his oxygen as needed. Reports that he is using his CPAP machine nightly.  No pain today.  Hypertension- Reports that he has ordered his BP monitor and is waiting for it to come in. Reports taking all medications as prescribed.  Denies any new problems or concerns. Reports that he is following a low salt diet. States that he is having a "Heart Procedure on 06/07/2021 in [facility] to check his leaking heart valves".  Covid- Reports that he has recovered from Covid.   DM- reports CBG 95-105.   RNCM Clinical Goal(s):  Patient will verbalize basic understanding of CHF, HTN, and DMII disease process and self health management plan as evidenced by patient report demonstrate improved adherence to prescribed treatment plan for CHF, HTN, and DMII as evidenced by reviewed of EMR and patient report  through collaboration with RN Care manager, provider, and care team.  Interventions: 1:1 collaboration with primary care provider regarding development and update of comprehensive plan of care as evidenced by provider attestation and co-signature Inter-disciplinary care team collaboration (see longitudinal plan of care) Evaluation of current treatment plan related to  self management and patient's adherence to plan as established by provider   Heart Failure Interventions:  (Status: Goal on Track (progressing): YES.)  Long Term Goal     Provided education on low sodium diet Assessed need for readable accurate scales in home Provided education about placing scale on hard, flat surface Advised patient to weigh each morning after emptying  bladder Discussed importance of daily weight and advised patient to weigh and record daily Reviewed role of diuretics in prevention of fluid overload and management of heart failure Discussed the importance of keeping all appointments with provider Encouraged patient to attend his follow up appointment with MD tomorrow. Reviewed discharge instructions and medications changes Encouraged patient to check his scales for problems and start weighing again.   Diabetes:  (Status: Goal on Track (progressing): YES.) Long Term Goal   Lab Results  Component Value Date   HGBA1C 6.1 (H) 12/29/2020  Assessed patient's understanding of A1c goal: <6.5% Reviewed medications with patient and discussed importance of medication adherence;        Counseled on importance of regular laboratory monitoring as prescribed;        Discussed plans with patient for ongoing care management follow up and provided patient with direct contact information for care management team;      Reviewed scheduled/upcoming provider appointments including: PCP and cardiology;         Advised patient, providing education and rationale, to check cbg once daily and when you have symptoms of low or high blood sugar and record        Review of patient status, including review of consultants reports, relevant laboratory and other test results, and medications completed;        Hypertension: (Status: Goal on Track (progressing): YES.) Long Term Goal  Last practice recorded BP readings:  BP Readings from Last 3 Encounters:  05/05/21 108/62  05/04/21 100/70  05/02/21 111/66  Most recent eGFR/CrCl:  Lab Results  Component Value Date   EGFR 18 (L) 05/04/2021    No components found for: CRCL  Provided education to patient re: stroke prevention, s/s of heart attack and stroke; Reviewed prescribed diet low salt diet Reviewed medications with patient and discussed importance of compliance;  Reviewed that BP machine has been ordered.    Patient Goals/Self-Care Activities: Take medications as prescribed   Attend all scheduled provider appointments Call pharmacy for medication refills 3-7 days in advance of running out of medications Call provider office for new concerns or questions  call office if I gain more than 2 pounds in one day or 5 pounds in one week do ankle pumps when sitting keep legs up while sitting watch for swelling in feet, ankles and legs every day weigh myself daily follow rescue plan if symptoms flare-up take the blood sugar log to all doctor visits take the blood sugar meter to all doctor visits check blood pressure daily when I get a machine choose a place to take my blood pressure (home, clinic or office, retail store) write blood pressure results in a log or diary keep a blood pressure log take blood pressure log to all doctor appointments call doctor for signs and symptoms of high blood pressure keep all doctor appointments take medications for blood pressure exactly as  prescribed report new symptoms to your doctor Check to see what the problem is with your scale.  Start monitoring your blood pressure when you get your scale.          Plan:Telephone follow up appointment with care management team member scheduled for:  06/16/2021  Tomasa Rand RN, BSN, CEN RN Case Manager - [facility] Mobile: [phone]

## 2021-06-02 NOTE — Telephone Encounter (Signed)
error 

## 2021-06-05 DIAGNOSIS — J9611 Chronic respiratory failure with hypoxia: Secondary | ICD-10-CM | POA: Diagnosis not present

## 2021-06-06 DIAGNOSIS — G4733 Obstructive sleep apnea (adult) (pediatric): Secondary | ICD-10-CM | POA: Diagnosis not present

## 2021-06-06 DIAGNOSIS — I480 Paroxysmal atrial fibrillation: Secondary | ICD-10-CM | POA: Diagnosis not present

## 2021-06-06 DIAGNOSIS — I4821 Permanent atrial fibrillation: Secondary | ICD-10-CM | POA: Diagnosis not present

## 2021-06-06 DIAGNOSIS — I509 Heart failure, unspecified: Secondary | ICD-10-CM | POA: Diagnosis not present

## 2021-06-06 DIAGNOSIS — I5022 Chronic systolic (congestive) heart failure: Secondary | ICD-10-CM | POA: Diagnosis not present

## 2021-06-06 DIAGNOSIS — Z950 Presence of cardiac pacemaker: Secondary | ICD-10-CM | POA: Diagnosis not present

## 2021-06-06 DIAGNOSIS — E785 Hyperlipidemia, unspecified: Secondary | ICD-10-CM | POA: Diagnosis not present

## 2021-06-06 DIAGNOSIS — E876 Hypokalemia: Secondary | ICD-10-CM | POA: Diagnosis not present

## 2021-06-06 DIAGNOSIS — I517 Cardiomegaly: Secondary | ICD-10-CM | POA: Diagnosis not present

## 2021-06-06 DIAGNOSIS — I482 Chronic atrial fibrillation, unspecified: Secondary | ICD-10-CM | POA: Diagnosis not present

## 2021-06-06 DIAGNOSIS — I5021 Acute systolic (congestive) heart failure: Secondary | ICD-10-CM | POA: Diagnosis not present

## 2021-06-06 DIAGNOSIS — I2582 Chronic total occlusion of coronary artery: Secondary | ICD-10-CM | POA: Diagnosis not present

## 2021-06-06 DIAGNOSIS — I4510 Unspecified right bundle-branch block: Secondary | ICD-10-CM | POA: Diagnosis not present

## 2021-06-06 DIAGNOSIS — I272 Pulmonary hypertension, unspecified: Secondary | ICD-10-CM | POA: Diagnosis not present

## 2021-06-06 DIAGNOSIS — I4819 Other persistent atrial fibrillation: Secondary | ICD-10-CM | POA: Diagnosis not present

## 2021-06-06 DIAGNOSIS — I340 Nonrheumatic mitral (valve) insufficiency: Secondary | ICD-10-CM | POA: Diagnosis not present

## 2021-06-06 DIAGNOSIS — I495 Sick sinus syndrome: Secondary | ICD-10-CM | POA: Diagnosis not present

## 2021-06-06 DIAGNOSIS — E1122 Type 2 diabetes mellitus with diabetic chronic kidney disease: Secondary | ICD-10-CM | POA: Diagnosis not present

## 2021-06-06 DIAGNOSIS — J90 Pleural effusion, not elsewhere classified: Secondary | ICD-10-CM | POA: Diagnosis not present

## 2021-06-06 DIAGNOSIS — E1151 Type 2 diabetes mellitus with diabetic peripheral angiopathy without gangrene: Secondary | ICD-10-CM | POA: Diagnosis not present

## 2021-06-06 DIAGNOSIS — I87323 Chronic venous hypertension (idiopathic) with inflammation of bilateral lower extremity: Secondary | ICD-10-CM | POA: Diagnosis not present

## 2021-06-06 DIAGNOSIS — Z87891 Personal history of nicotine dependence: Secondary | ICD-10-CM | POA: Diagnosis not present

## 2021-06-06 DIAGNOSIS — Z20822 Contact with and (suspected) exposure to covid-19: Secondary | ICD-10-CM | POA: Diagnosis not present

## 2021-06-06 DIAGNOSIS — I082 Rheumatic disorders of both aortic and tricuspid valves: Secondary | ICD-10-CM | POA: Diagnosis not present

## 2021-06-06 DIAGNOSIS — N184 Chronic kidney disease, stage 4 (severe): Secondary | ICD-10-CM | POA: Diagnosis not present

## 2021-06-06 DIAGNOSIS — I129 Hypertensive chronic kidney disease with stage 1 through stage 4 chronic kidney disease, or unspecified chronic kidney disease: Secondary | ICD-10-CM | POA: Diagnosis not present

## 2021-06-06 DIAGNOSIS — R778 Other specified abnormalities of plasma proteins: Secondary | ICD-10-CM | POA: Diagnosis not present

## 2021-06-06 DIAGNOSIS — I5043 Acute on chronic combined systolic (congestive) and diastolic (congestive) heart failure: Secondary | ICD-10-CM | POA: Diagnosis not present

## 2021-06-06 DIAGNOSIS — J918 Pleural effusion in other conditions classified elsewhere: Secondary | ICD-10-CM | POA: Diagnosis not present

## 2021-06-06 DIAGNOSIS — R791 Abnormal coagulation profile: Secondary | ICD-10-CM | POA: Diagnosis not present

## 2021-06-06 DIAGNOSIS — I5033 Acute on chronic diastolic (congestive) heart failure: Secondary | ICD-10-CM | POA: Diagnosis not present

## 2021-06-06 DIAGNOSIS — J9811 Atelectasis: Secondary | ICD-10-CM | POA: Diagnosis not present

## 2021-06-06 DIAGNOSIS — I2510 Atherosclerotic heart disease of native coronary artery without angina pectoris: Secondary | ICD-10-CM | POA: Diagnosis not present

## 2021-06-06 DIAGNOSIS — I341 Nonrheumatic mitral (valve) prolapse: Secondary | ICD-10-CM | POA: Diagnosis not present

## 2021-06-06 DIAGNOSIS — I252 Old myocardial infarction: Secondary | ICD-10-CM | POA: Diagnosis not present

## 2021-06-06 DIAGNOSIS — J9611 Chronic respiratory failure with hypoxia: Secondary | ICD-10-CM | POA: Diagnosis not present

## 2021-06-06 DIAGNOSIS — N179 Acute kidney failure, unspecified: Secondary | ICD-10-CM | POA: Diagnosis not present

## 2021-06-06 DIAGNOSIS — D696 Thrombocytopenia, unspecified: Secondary | ICD-10-CM | POA: Diagnosis not present

## 2021-06-06 DIAGNOSIS — Z7901 Long term (current) use of anticoagulants: Secondary | ICD-10-CM | POA: Diagnosis not present

## 2021-06-06 DIAGNOSIS — K5900 Constipation, unspecified: Secondary | ICD-10-CM | POA: Diagnosis not present

## 2021-06-06 DIAGNOSIS — I5023 Acute on chronic systolic (congestive) heart failure: Secondary | ICD-10-CM | POA: Diagnosis not present

## 2021-06-06 DIAGNOSIS — J811 Chronic pulmonary edema: Secondary | ICD-10-CM | POA: Diagnosis not present

## 2021-06-06 DIAGNOSIS — I10 Essential (primary) hypertension: Secondary | ICD-10-CM | POA: Diagnosis not present

## 2021-06-06 DIAGNOSIS — Z9989 Dependence on other enabling machines and devices: Secondary | ICD-10-CM | POA: Diagnosis not present

## 2021-06-06 DIAGNOSIS — I3489 Other nonrheumatic mitral valve disorders: Secondary | ICD-10-CM | POA: Diagnosis not present

## 2021-06-06 DIAGNOSIS — Z8616 Personal history of COVID-19: Secondary | ICD-10-CM | POA: Diagnosis not present

## 2021-06-06 DIAGNOSIS — R0789 Other chest pain: Secondary | ICD-10-CM | POA: Diagnosis not present

## 2021-06-06 DIAGNOSIS — I420 Dilated cardiomyopathy: Secondary | ICD-10-CM | POA: Diagnosis not present

## 2021-06-06 DIAGNOSIS — I5020 Unspecified systolic (congestive) heart failure: Secondary | ICD-10-CM | POA: Diagnosis not present

## 2021-06-06 DIAGNOSIS — I130 Hypertensive heart and chronic kidney disease with heart failure and stage 1 through stage 4 chronic kidney disease, or unspecified chronic kidney disease: Secondary | ICD-10-CM | POA: Diagnosis not present

## 2021-06-06 DIAGNOSIS — R0602 Shortness of breath: Secondary | ICD-10-CM | POA: Diagnosis not present

## 2021-06-06 DIAGNOSIS — J9601 Acute respiratory failure with hypoxia: Secondary | ICD-10-CM | POA: Diagnosis not present

## 2021-06-06 DIAGNOSIS — Z955 Presence of coronary angioplasty implant and graft: Secondary | ICD-10-CM | POA: Diagnosis not present

## 2021-06-06 DIAGNOSIS — I255 Ischemic cardiomyopathy: Secondary | ICD-10-CM | POA: Diagnosis not present

## 2021-06-06 DIAGNOSIS — N189 Chronic kidney disease, unspecified: Secondary | ICD-10-CM | POA: Diagnosis not present

## 2021-06-06 DIAGNOSIS — I110 Hypertensive heart disease with heart failure: Secondary | ICD-10-CM | POA: Diagnosis not present

## 2021-06-06 DIAGNOSIS — I511 Rupture of chordae tendineae, not elsewhere classified: Secondary | ICD-10-CM | POA: Diagnosis not present

## 2021-06-06 DIAGNOSIS — I493 Ventricular premature depolarization: Secondary | ICD-10-CM | POA: Diagnosis not present

## 2021-06-06 DIAGNOSIS — I2729 Other secondary pulmonary hypertension: Secondary | ICD-10-CM | POA: Diagnosis not present

## 2021-06-06 DIAGNOSIS — N183 Chronic kidney disease, stage 3 unspecified: Secondary | ICD-10-CM | POA: Diagnosis not present

## 2021-06-06 DIAGNOSIS — Z9981 Dependence on supplemental oxygen: Secondary | ICD-10-CM | POA: Diagnosis not present

## 2021-06-07 DIAGNOSIS — N184 Chronic kidney disease, stage 4 (severe): Secondary | ICD-10-CM

## 2021-06-07 DIAGNOSIS — I10 Essential (primary) hypertension: Secondary | ICD-10-CM | POA: Diagnosis not present

## 2021-06-07 DIAGNOSIS — E1151 Type 2 diabetes mellitus with diabetic peripheral angiopathy without gangrene: Secondary | ICD-10-CM

## 2021-06-16 ENCOUNTER — Telehealth: Payer: Medicare Other

## 2021-06-18 DIAGNOSIS — R0789 Other chest pain: Secondary | ICD-10-CM | POA: Diagnosis not present

## 2021-06-18 DIAGNOSIS — N184 Chronic kidney disease, stage 4 (severe): Secondary | ICD-10-CM | POA: Diagnosis not present

## 2021-06-18 DIAGNOSIS — F1090 Alcohol use, unspecified, uncomplicated: Secondary | ICD-10-CM | POA: Diagnosis not present

## 2021-06-18 DIAGNOSIS — I2582 Chronic total occlusion of coronary artery: Secondary | ICD-10-CM | POA: Diagnosis not present

## 2021-06-18 DIAGNOSIS — Z01810 Encounter for preprocedural cardiovascular examination: Secondary | ICD-10-CM | POA: Diagnosis not present

## 2021-06-18 DIAGNOSIS — I452 Bifascicular block: Secondary | ICD-10-CM | POA: Diagnosis not present

## 2021-06-18 DIAGNOSIS — J9611 Chronic respiratory failure with hypoxia: Secondary | ICD-10-CM | POA: Diagnosis not present

## 2021-06-18 DIAGNOSIS — N183 Chronic kidney disease, stage 3 unspecified: Secondary | ICD-10-CM | POA: Diagnosis not present

## 2021-06-18 DIAGNOSIS — E871 Hypo-osmolality and hyponatremia: Secondary | ICD-10-CM | POA: Diagnosis not present

## 2021-06-18 DIAGNOSIS — J9601 Acute respiratory failure with hypoxia: Secondary | ICD-10-CM | POA: Diagnosis not present

## 2021-06-18 DIAGNOSIS — Z006 Encounter for examination for normal comparison and control in clinical research program: Secondary | ICD-10-CM | POA: Diagnosis not present

## 2021-06-18 DIAGNOSIS — Z87891 Personal history of nicotine dependence: Secondary | ICD-10-CM | POA: Diagnosis not present

## 2021-06-18 DIAGNOSIS — E785 Hyperlipidemia, unspecified: Secondary | ICD-10-CM | POA: Diagnosis not present

## 2021-06-18 DIAGNOSIS — N179 Acute kidney failure, unspecified: Secondary | ICD-10-CM | POA: Diagnosis not present

## 2021-06-18 DIAGNOSIS — I2510 Atherosclerotic heart disease of native coronary artery without angina pectoris: Secondary | ICD-10-CM | POA: Diagnosis not present

## 2021-06-18 DIAGNOSIS — J9811 Atelectasis: Secondary | ICD-10-CM | POA: Diagnosis not present

## 2021-06-18 DIAGNOSIS — Z950 Presence of cardiac pacemaker: Secondary | ICD-10-CM | POA: Diagnosis not present

## 2021-06-18 DIAGNOSIS — K5900 Constipation, unspecified: Secondary | ICD-10-CM | POA: Diagnosis not present

## 2021-06-18 DIAGNOSIS — I509 Heart failure, unspecified: Secondary | ICD-10-CM | POA: Diagnosis not present

## 2021-06-18 DIAGNOSIS — E876 Hypokalemia: Secondary | ICD-10-CM | POA: Diagnosis not present

## 2021-06-18 DIAGNOSIS — I341 Nonrheumatic mitral (valve) prolapse: Secondary | ICD-10-CM | POA: Diagnosis not present

## 2021-06-18 DIAGNOSIS — I420 Dilated cardiomyopathy: Secondary | ICD-10-CM | POA: Diagnosis not present

## 2021-06-18 DIAGNOSIS — I255 Ischemic cardiomyopathy: Secondary | ICD-10-CM | POA: Diagnosis not present

## 2021-06-18 DIAGNOSIS — R9431 Abnormal electrocardiogram [ECG] [EKG]: Secondary | ICD-10-CM | POA: Diagnosis not present

## 2021-06-18 DIAGNOSIS — I5022 Chronic systolic (congestive) heart failure: Secondary | ICD-10-CM | POA: Diagnosis not present

## 2021-06-18 DIAGNOSIS — Z95818 Presence of other cardiac implants and grafts: Secondary | ICD-10-CM | POA: Diagnosis not present

## 2021-06-18 DIAGNOSIS — I4819 Other persistent atrial fibrillation: Secondary | ICD-10-CM | POA: Diagnosis not present

## 2021-06-18 DIAGNOSIS — M1990 Unspecified osteoarthritis, unspecified site: Secondary | ICD-10-CM | POA: Diagnosis not present

## 2021-06-18 DIAGNOSIS — I350 Nonrheumatic aortic (valve) stenosis: Secondary | ICD-10-CM | POA: Diagnosis not present

## 2021-06-18 DIAGNOSIS — I130 Hypertensive heart and chronic kidney disease with heart failure and stage 1 through stage 4 chronic kidney disease, or unspecified chronic kidney disease: Secondary | ICD-10-CM | POA: Diagnosis not present

## 2021-06-18 DIAGNOSIS — I340 Nonrheumatic mitral (valve) insufficiency: Secondary | ICD-10-CM | POA: Diagnosis not present

## 2021-06-18 DIAGNOSIS — I480 Paroxysmal atrial fibrillation: Secondary | ICD-10-CM | POA: Diagnosis not present

## 2021-06-18 DIAGNOSIS — I4510 Unspecified right bundle-branch block: Secondary | ICD-10-CM | POA: Diagnosis not present

## 2021-06-18 DIAGNOSIS — J810 Acute pulmonary edema: Secondary | ICD-10-CM | POA: Diagnosis not present

## 2021-06-18 DIAGNOSIS — Z7901 Long term (current) use of anticoagulants: Secondary | ICD-10-CM | POA: Diagnosis not present

## 2021-06-18 DIAGNOSIS — I129 Hypertensive chronic kidney disease with stage 1 through stage 4 chronic kidney disease, or unspecified chronic kidney disease: Secondary | ICD-10-CM | POA: Diagnosis not present

## 2021-06-18 DIAGNOSIS — G4733 Obstructive sleep apnea (adult) (pediatric): Secondary | ICD-10-CM | POA: Diagnosis not present

## 2021-06-18 DIAGNOSIS — I5023 Acute on chronic systolic (congestive) heart failure: Secondary | ICD-10-CM | POA: Diagnosis not present

## 2021-06-18 DIAGNOSIS — R531 Weakness: Secondary | ICD-10-CM | POA: Diagnosis not present

## 2021-06-18 DIAGNOSIS — E119 Type 2 diabetes mellitus without complications: Secondary | ICD-10-CM | POA: Diagnosis not present

## 2021-06-18 DIAGNOSIS — N189 Chronic kidney disease, unspecified: Secondary | ICD-10-CM | POA: Diagnosis not present

## 2021-06-18 DIAGNOSIS — R791 Abnormal coagulation profile: Secondary | ICD-10-CM | POA: Diagnosis not present

## 2021-06-18 DIAGNOSIS — Z77123 Contact with and (suspected) exposure to radon and other naturally occuring radiation: Secondary | ICD-10-CM | POA: Diagnosis not present

## 2021-06-18 DIAGNOSIS — I4811 Longstanding persistent atrial fibrillation: Secondary | ICD-10-CM | POA: Diagnosis not present

## 2021-06-18 DIAGNOSIS — I5020 Unspecified systolic (congestive) heart failure: Secondary | ICD-10-CM | POA: Diagnosis not present

## 2021-06-18 DIAGNOSIS — Z955 Presence of coronary angioplasty implant and graft: Secondary | ICD-10-CM | POA: Diagnosis not present

## 2021-06-18 DIAGNOSIS — E1122 Type 2 diabetes mellitus with diabetic chronic kidney disease: Secondary | ICD-10-CM | POA: Diagnosis not present

## 2021-06-18 DIAGNOSIS — I059 Rheumatic mitral valve disease, unspecified: Secondary | ICD-10-CM | POA: Diagnosis not present

## 2021-06-18 DIAGNOSIS — I110 Hypertensive heart disease with heart failure: Secondary | ICD-10-CM | POA: Diagnosis not present

## 2021-06-18 DIAGNOSIS — J90 Pleural effusion, not elsewhere classified: Secondary | ICD-10-CM | POA: Diagnosis not present

## 2021-06-18 DIAGNOSIS — I959 Hypotension, unspecified: Secondary | ICD-10-CM | POA: Diagnosis not present

## 2021-06-19 DIAGNOSIS — I340 Nonrheumatic mitral (valve) insufficiency: Secondary | ICD-10-CM | POA: Diagnosis not present

## 2021-06-19 DIAGNOSIS — I480 Paroxysmal atrial fibrillation: Secondary | ICD-10-CM | POA: Diagnosis not present

## 2021-06-19 DIAGNOSIS — I2510 Atherosclerotic heart disease of native coronary artery without angina pectoris: Secondary | ICD-10-CM | POA: Diagnosis not present

## 2021-06-19 DIAGNOSIS — E1122 Type 2 diabetes mellitus with diabetic chronic kidney disease: Secondary | ICD-10-CM | POA: Diagnosis not present

## 2021-06-19 DIAGNOSIS — N189 Chronic kidney disease, unspecified: Secondary | ICD-10-CM | POA: Diagnosis not present

## 2021-06-19 DIAGNOSIS — I129 Hypertensive chronic kidney disease with stage 1 through stage 4 chronic kidney disease, or unspecified chronic kidney disease: Secondary | ICD-10-CM | POA: Diagnosis not present

## 2021-06-19 DIAGNOSIS — I2582 Chronic total occlusion of coronary artery: Secondary | ICD-10-CM | POA: Diagnosis not present

## 2021-06-19 DIAGNOSIS — N179 Acute kidney failure, unspecified: Secondary | ICD-10-CM | POA: Diagnosis not present

## 2021-06-19 DIAGNOSIS — I059 Rheumatic mitral valve disease, unspecified: Secondary | ICD-10-CM | POA: Diagnosis not present

## 2021-06-20 DIAGNOSIS — I480 Paroxysmal atrial fibrillation: Secondary | ICD-10-CM | POA: Diagnosis not present

## 2021-06-20 DIAGNOSIS — I059 Rheumatic mitral valve disease, unspecified: Secondary | ICD-10-CM | POA: Diagnosis not present

## 2021-06-20 DIAGNOSIS — N189 Chronic kidney disease, unspecified: Secondary | ICD-10-CM | POA: Diagnosis not present

## 2021-06-20 DIAGNOSIS — N179 Acute kidney failure, unspecified: Secondary | ICD-10-CM | POA: Diagnosis not present

## 2021-06-20 DIAGNOSIS — E1122 Type 2 diabetes mellitus with diabetic chronic kidney disease: Secondary | ICD-10-CM | POA: Diagnosis not present

## 2021-06-20 DIAGNOSIS — I2510 Atherosclerotic heart disease of native coronary artery without angina pectoris: Secondary | ICD-10-CM | POA: Diagnosis not present

## 2021-06-20 DIAGNOSIS — Z7901 Long term (current) use of anticoagulants: Secondary | ICD-10-CM | POA: Diagnosis not present

## 2021-06-20 DIAGNOSIS — I340 Nonrheumatic mitral (valve) insufficiency: Secondary | ICD-10-CM | POA: Diagnosis not present

## 2021-06-20 DIAGNOSIS — I129 Hypertensive chronic kidney disease with stage 1 through stage 4 chronic kidney disease, or unspecified chronic kidney disease: Secondary | ICD-10-CM | POA: Diagnosis not present

## 2021-06-21 DIAGNOSIS — E119 Type 2 diabetes mellitus without complications: Secondary | ICD-10-CM | POA: Diagnosis not present

## 2021-06-21 DIAGNOSIS — N189 Chronic kidney disease, unspecified: Secondary | ICD-10-CM | POA: Diagnosis not present

## 2021-06-21 DIAGNOSIS — E785 Hyperlipidemia, unspecified: Secondary | ICD-10-CM | POA: Diagnosis not present

## 2021-06-21 DIAGNOSIS — I2510 Atherosclerotic heart disease of native coronary artery without angina pectoris: Secondary | ICD-10-CM | POA: Diagnosis not present

## 2021-06-21 DIAGNOSIS — I4819 Other persistent atrial fibrillation: Secondary | ICD-10-CM | POA: Diagnosis not present

## 2021-06-21 DIAGNOSIS — I340 Nonrheumatic mitral (valve) insufficiency: Secondary | ICD-10-CM | POA: Diagnosis not present

## 2021-06-21 DIAGNOSIS — N179 Acute kidney failure, unspecified: Secondary | ICD-10-CM | POA: Diagnosis not present

## 2021-06-21 DIAGNOSIS — I129 Hypertensive chronic kidney disease with stage 1 through stage 4 chronic kidney disease, or unspecified chronic kidney disease: Secondary | ICD-10-CM | POA: Diagnosis not present

## 2021-06-22 DIAGNOSIS — N179 Acute kidney failure, unspecified: Secondary | ICD-10-CM | POA: Diagnosis not present

## 2021-06-22 DIAGNOSIS — I129 Hypertensive chronic kidney disease with stage 1 through stage 4 chronic kidney disease, or unspecified chronic kidney disease: Secondary | ICD-10-CM | POA: Diagnosis not present

## 2021-06-22 DIAGNOSIS — N189 Chronic kidney disease, unspecified: Secondary | ICD-10-CM | POA: Diagnosis not present

## 2021-06-22 DIAGNOSIS — E1122 Type 2 diabetes mellitus with diabetic chronic kidney disease: Secondary | ICD-10-CM | POA: Diagnosis not present

## 2021-06-22 DIAGNOSIS — R791 Abnormal coagulation profile: Secondary | ICD-10-CM | POA: Diagnosis not present

## 2021-06-22 DIAGNOSIS — I4819 Other persistent atrial fibrillation: Secondary | ICD-10-CM | POA: Diagnosis not present

## 2021-06-22 DIAGNOSIS — I340 Nonrheumatic mitral (valve) insufficiency: Secondary | ICD-10-CM | POA: Diagnosis not present

## 2021-06-22 DIAGNOSIS — I2510 Atherosclerotic heart disease of native coronary artery without angina pectoris: Secondary | ICD-10-CM | POA: Diagnosis not present

## 2021-06-22 DIAGNOSIS — E785 Hyperlipidemia, unspecified: Secondary | ICD-10-CM | POA: Diagnosis not present

## 2021-06-23 ENCOUNTER — Telehealth: Payer: Self-pay | Admitting: *Deleted

## 2021-06-23 DIAGNOSIS — N179 Acute kidney failure, unspecified: Secondary | ICD-10-CM | POA: Diagnosis not present

## 2021-06-23 DIAGNOSIS — E1122 Type 2 diabetes mellitus with diabetic chronic kidney disease: Secondary | ICD-10-CM | POA: Diagnosis not present

## 2021-06-23 DIAGNOSIS — E785 Hyperlipidemia, unspecified: Secondary | ICD-10-CM | POA: Diagnosis not present

## 2021-06-23 DIAGNOSIS — I129 Hypertensive chronic kidney disease with stage 1 through stage 4 chronic kidney disease, or unspecified chronic kidney disease: Secondary | ICD-10-CM | POA: Diagnosis not present

## 2021-06-23 DIAGNOSIS — I4819 Other persistent atrial fibrillation: Secondary | ICD-10-CM | POA: Diagnosis not present

## 2021-06-23 DIAGNOSIS — I2510 Atherosclerotic heart disease of native coronary artery without angina pectoris: Secondary | ICD-10-CM | POA: Diagnosis not present

## 2021-06-23 DIAGNOSIS — N189 Chronic kidney disease, unspecified: Secondary | ICD-10-CM | POA: Diagnosis not present

## 2021-06-23 DIAGNOSIS — I340 Nonrheumatic mitral (valve) insufficiency: Secondary | ICD-10-CM | POA: Diagnosis not present

## 2021-06-23 NOTE — Chronic Care Management (AMB) (Signed)
°  Care Management   Note  06/23/2021 Name: Steven Hester MRN: [id number] DOB: 02-25-1946  [name] is a 76 y.o. year old male who is a primary care patient of Lillard Anes, MD and is actively engaged with the care management team. I reached out to [name] by phone today to assist with re-scheduling a follow up visit with the RN Case Manager  Follow up plan: Unsuccessful telephone outreach attempt made. A HIPAA compliant phone message was left for the patient providing contact information and requesting a return call.  The care management team will reach out to the patient again over the next 7 days.  If patient returns call to provider office, please advise to call [place] at [phone].  [place] Management  Direct Dial: [phone]

## 2021-06-24 DIAGNOSIS — I480 Paroxysmal atrial fibrillation: Secondary | ICD-10-CM | POA: Diagnosis not present

## 2021-06-24 DIAGNOSIS — E1122 Type 2 diabetes mellitus with diabetic chronic kidney disease: Secondary | ICD-10-CM | POA: Diagnosis not present

## 2021-06-24 DIAGNOSIS — I2510 Atherosclerotic heart disease of native coronary artery without angina pectoris: Secondary | ICD-10-CM | POA: Diagnosis not present

## 2021-06-24 DIAGNOSIS — N179 Acute kidney failure, unspecified: Secondary | ICD-10-CM | POA: Diagnosis not present

## 2021-06-24 DIAGNOSIS — I129 Hypertensive chronic kidney disease with stage 1 through stage 4 chronic kidney disease, or unspecified chronic kidney disease: Secondary | ICD-10-CM | POA: Diagnosis not present

## 2021-06-24 DIAGNOSIS — N189 Chronic kidney disease, unspecified: Secondary | ICD-10-CM | POA: Diagnosis not present

## 2021-06-24 DIAGNOSIS — I340 Nonrheumatic mitral (valve) insufficiency: Secondary | ICD-10-CM | POA: Diagnosis not present

## 2021-06-25 DIAGNOSIS — I340 Nonrheumatic mitral (valve) insufficiency: Secondary | ICD-10-CM | POA: Diagnosis not present

## 2021-06-25 DIAGNOSIS — N189 Chronic kidney disease, unspecified: Secondary | ICD-10-CM | POA: Diagnosis not present

## 2021-06-25 DIAGNOSIS — N179 Acute kidney failure, unspecified: Secondary | ICD-10-CM | POA: Diagnosis not present

## 2021-06-25 DIAGNOSIS — I2510 Atherosclerotic heart disease of native coronary artery without angina pectoris: Secondary | ICD-10-CM | POA: Diagnosis not present

## 2021-06-25 DIAGNOSIS — I480 Paroxysmal atrial fibrillation: Secondary | ICD-10-CM | POA: Diagnosis not present

## 2021-06-25 DIAGNOSIS — E1122 Type 2 diabetes mellitus with diabetic chronic kidney disease: Secondary | ICD-10-CM | POA: Diagnosis not present

## 2021-06-25 DIAGNOSIS — I129 Hypertensive chronic kidney disease with stage 1 through stage 4 chronic kidney disease, or unspecified chronic kidney disease: Secondary | ICD-10-CM | POA: Diagnosis not present

## 2021-06-26 DIAGNOSIS — I340 Nonrheumatic mitral (valve) insufficiency: Secondary | ICD-10-CM | POA: Diagnosis not present

## 2021-06-26 DIAGNOSIS — N189 Chronic kidney disease, unspecified: Secondary | ICD-10-CM | POA: Diagnosis not present

## 2021-06-26 DIAGNOSIS — N179 Acute kidney failure, unspecified: Secondary | ICD-10-CM | POA: Diagnosis not present

## 2021-06-26 DIAGNOSIS — I2510 Atherosclerotic heart disease of native coronary artery without angina pectoris: Secondary | ICD-10-CM | POA: Diagnosis not present

## 2021-06-26 DIAGNOSIS — E1122 Type 2 diabetes mellitus with diabetic chronic kidney disease: Secondary | ICD-10-CM | POA: Diagnosis not present

## 2021-06-26 DIAGNOSIS — I480 Paroxysmal atrial fibrillation: Secondary | ICD-10-CM | POA: Diagnosis not present

## 2021-06-26 DIAGNOSIS — I129 Hypertensive chronic kidney disease with stage 1 through stage 4 chronic kidney disease, or unspecified chronic kidney disease: Secondary | ICD-10-CM | POA: Diagnosis not present

## 2021-06-27 DIAGNOSIS — I2510 Atherosclerotic heart disease of native coronary artery without angina pectoris: Secondary | ICD-10-CM | POA: Diagnosis not present

## 2021-06-27 DIAGNOSIS — I480 Paroxysmal atrial fibrillation: Secondary | ICD-10-CM | POA: Diagnosis not present

## 2021-06-27 DIAGNOSIS — N189 Chronic kidney disease, unspecified: Secondary | ICD-10-CM | POA: Diagnosis not present

## 2021-06-27 DIAGNOSIS — E1122 Type 2 diabetes mellitus with diabetic chronic kidney disease: Secondary | ICD-10-CM | POA: Diagnosis not present

## 2021-06-27 DIAGNOSIS — I340 Nonrheumatic mitral (valve) insufficiency: Secondary | ICD-10-CM | POA: Diagnosis not present

## 2021-06-27 DIAGNOSIS — I129 Hypertensive chronic kidney disease with stage 1 through stage 4 chronic kidney disease, or unspecified chronic kidney disease: Secondary | ICD-10-CM | POA: Diagnosis not present

## 2021-06-27 DIAGNOSIS — N179 Acute kidney failure, unspecified: Secondary | ICD-10-CM | POA: Diagnosis not present

## 2021-06-28 DIAGNOSIS — E1122 Type 2 diabetes mellitus with diabetic chronic kidney disease: Secondary | ICD-10-CM | POA: Diagnosis not present

## 2021-06-28 DIAGNOSIS — I5023 Acute on chronic systolic (congestive) heart failure: Secondary | ICD-10-CM | POA: Diagnosis not present

## 2021-06-28 DIAGNOSIS — I4819 Other persistent atrial fibrillation: Secondary | ICD-10-CM | POA: Diagnosis not present

## 2021-06-28 DIAGNOSIS — I340 Nonrheumatic mitral (valve) insufficiency: Secondary | ICD-10-CM | POA: Diagnosis not present

## 2021-06-28 DIAGNOSIS — N183 Chronic kidney disease, stage 3 unspecified: Secondary | ICD-10-CM | POA: Diagnosis not present

## 2021-06-28 DIAGNOSIS — I110 Hypertensive heart disease with heart failure: Secondary | ICD-10-CM | POA: Diagnosis not present

## 2021-06-28 DIAGNOSIS — I4510 Unspecified right bundle-branch block: Secondary | ICD-10-CM | POA: Diagnosis not present

## 2021-06-28 DIAGNOSIS — I2510 Atherosclerotic heart disease of native coronary artery without angina pectoris: Secondary | ICD-10-CM | POA: Diagnosis not present

## 2021-06-28 DIAGNOSIS — Z950 Presence of cardiac pacemaker: Secondary | ICD-10-CM | POA: Diagnosis not present

## 2021-06-28 DIAGNOSIS — Z7901 Long term (current) use of anticoagulants: Secondary | ICD-10-CM | POA: Diagnosis not present

## 2021-06-29 DIAGNOSIS — I340 Nonrheumatic mitral (valve) insufficiency: Secondary | ICD-10-CM | POA: Diagnosis not present

## 2021-06-29 DIAGNOSIS — E1122 Type 2 diabetes mellitus with diabetic chronic kidney disease: Secondary | ICD-10-CM | POA: Diagnosis not present

## 2021-06-29 DIAGNOSIS — N183 Chronic kidney disease, stage 3 unspecified: Secondary | ICD-10-CM | POA: Diagnosis not present

## 2021-06-29 DIAGNOSIS — N184 Chronic kidney disease, stage 4 (severe): Secondary | ICD-10-CM | POA: Diagnosis not present

## 2021-06-29 DIAGNOSIS — I4819 Other persistent atrial fibrillation: Secondary | ICD-10-CM | POA: Diagnosis not present

## 2021-06-29 DIAGNOSIS — Z7901 Long term (current) use of anticoagulants: Secondary | ICD-10-CM | POA: Diagnosis not present

## 2021-06-29 DIAGNOSIS — I5023 Acute on chronic systolic (congestive) heart failure: Secondary | ICD-10-CM | POA: Diagnosis not present

## 2021-06-29 DIAGNOSIS — Z950 Presence of cardiac pacemaker: Secondary | ICD-10-CM | POA: Diagnosis not present

## 2021-06-29 DIAGNOSIS — I110 Hypertensive heart disease with heart failure: Secondary | ICD-10-CM | POA: Diagnosis not present

## 2021-06-29 DIAGNOSIS — I4510 Unspecified right bundle-branch block: Secondary | ICD-10-CM | POA: Diagnosis not present

## 2021-06-29 DIAGNOSIS — I2510 Atherosclerotic heart disease of native coronary artery without angina pectoris: Secondary | ICD-10-CM | POA: Diagnosis not present

## 2021-06-30 DIAGNOSIS — Z95818 Presence of other cardiac implants and grafts: Secondary | ICD-10-CM | POA: Diagnosis not present

## 2021-06-30 DIAGNOSIS — R9431 Abnormal electrocardiogram [ECG] [EKG]: Secondary | ICD-10-CM | POA: Diagnosis not present

## 2021-06-30 DIAGNOSIS — Z01810 Encounter for preprocedural cardiovascular examination: Secondary | ICD-10-CM | POA: Diagnosis not present

## 2021-06-30 DIAGNOSIS — I340 Nonrheumatic mitral (valve) insufficiency: Secondary | ICD-10-CM | POA: Diagnosis not present

## 2021-06-30 NOTE — Chronic Care Management (AMB) (Signed)
°  Care Management   Note  06/30/2021 Name: Steven Hester MRN: [id number] DOB: [birth year]  Steven Hester is a 76 y.o. year old male who is a primary care patient of Steven Anes, MD and is actively engaged with the care management team. I reached out to [name] by phone today to assist with re-scheduling a follow up visit with the RN Case Manager  Follow up plan: Unsuccessful telephone outreach attempt made. A HIPAA compliant phone message was left for the patient providing contact information and requesting a return call.  The care management team will reach out to the patient again over the next 7 days.  If patient returns call to provider office, please advise to call [place] at [phone].  [place] Management  Direct Dial: [phone]

## 2021-07-01 DIAGNOSIS — I2510 Atherosclerotic heart disease of native coronary artery without angina pectoris: Secondary | ICD-10-CM | POA: Diagnosis not present

## 2021-07-01 DIAGNOSIS — E1122 Type 2 diabetes mellitus with diabetic chronic kidney disease: Secondary | ICD-10-CM | POA: Diagnosis not present

## 2021-07-01 DIAGNOSIS — Z7901 Long term (current) use of anticoagulants: Secondary | ICD-10-CM | POA: Diagnosis not present

## 2021-07-01 DIAGNOSIS — I5023 Acute on chronic systolic (congestive) heart failure: Secondary | ICD-10-CM | POA: Diagnosis not present

## 2021-07-01 DIAGNOSIS — Z950 Presence of cardiac pacemaker: Secondary | ICD-10-CM | POA: Diagnosis not present

## 2021-07-01 DIAGNOSIS — I130 Hypertensive heart and chronic kidney disease with heart failure and stage 1 through stage 4 chronic kidney disease, or unspecified chronic kidney disease: Secondary | ICD-10-CM | POA: Diagnosis not present

## 2021-07-01 DIAGNOSIS — I4510 Unspecified right bundle-branch block: Secondary | ICD-10-CM | POA: Diagnosis not present

## 2021-07-01 DIAGNOSIS — I255 Ischemic cardiomyopathy: Secondary | ICD-10-CM | POA: Diagnosis not present

## 2021-07-01 DIAGNOSIS — Z95818 Presence of other cardiac implants and grafts: Secondary | ICD-10-CM | POA: Diagnosis not present

## 2021-07-01 DIAGNOSIS — I340 Nonrheumatic mitral (valve) insufficiency: Secondary | ICD-10-CM | POA: Diagnosis not present

## 2021-07-01 DIAGNOSIS — N183 Chronic kidney disease, stage 3 unspecified: Secondary | ICD-10-CM | POA: Diagnosis not present

## 2021-07-01 DIAGNOSIS — I4819 Other persistent atrial fibrillation: Secondary | ICD-10-CM | POA: Diagnosis not present

## 2021-07-02 DIAGNOSIS — I2510 Atherosclerotic heart disease of native coronary artery without angina pectoris: Secondary | ICD-10-CM | POA: Diagnosis not present

## 2021-07-02 DIAGNOSIS — N183 Chronic kidney disease, stage 3 unspecified: Secondary | ICD-10-CM | POA: Diagnosis not present

## 2021-07-02 DIAGNOSIS — I5023 Acute on chronic systolic (congestive) heart failure: Secondary | ICD-10-CM | POA: Diagnosis not present

## 2021-07-02 DIAGNOSIS — I255 Ischemic cardiomyopathy: Secondary | ICD-10-CM | POA: Diagnosis not present

## 2021-07-02 DIAGNOSIS — Z95818 Presence of other cardiac implants and grafts: Secondary | ICD-10-CM | POA: Diagnosis not present

## 2021-07-02 DIAGNOSIS — I130 Hypertensive heart and chronic kidney disease with heart failure and stage 1 through stage 4 chronic kidney disease, or unspecified chronic kidney disease: Secondary | ICD-10-CM | POA: Diagnosis not present

## 2021-07-02 DIAGNOSIS — E1122 Type 2 diabetes mellitus with diabetic chronic kidney disease: Secondary | ICD-10-CM | POA: Diagnosis not present

## 2021-07-02 DIAGNOSIS — I340 Nonrheumatic mitral (valve) insufficiency: Secondary | ICD-10-CM | POA: Diagnosis not present

## 2021-07-02 DIAGNOSIS — I4819 Other persistent atrial fibrillation: Secondary | ICD-10-CM | POA: Diagnosis not present

## 2021-07-02 DIAGNOSIS — Z950 Presence of cardiac pacemaker: Secondary | ICD-10-CM | POA: Diagnosis not present

## 2021-07-02 DIAGNOSIS — Z7901 Long term (current) use of anticoagulants: Secondary | ICD-10-CM | POA: Diagnosis not present

## 2021-07-02 DIAGNOSIS — I4510 Unspecified right bundle-branch block: Secondary | ICD-10-CM | POA: Diagnosis not present

## 2021-07-03 DIAGNOSIS — Z95818 Presence of other cardiac implants and grafts: Secondary | ICD-10-CM | POA: Diagnosis not present

## 2021-07-03 DIAGNOSIS — Z950 Presence of cardiac pacemaker: Secondary | ICD-10-CM | POA: Diagnosis not present

## 2021-07-03 DIAGNOSIS — Z7901 Long term (current) use of anticoagulants: Secondary | ICD-10-CM | POA: Diagnosis not present

## 2021-07-03 DIAGNOSIS — I4510 Unspecified right bundle-branch block: Secondary | ICD-10-CM | POA: Diagnosis not present

## 2021-07-03 DIAGNOSIS — I5023 Acute on chronic systolic (congestive) heart failure: Secondary | ICD-10-CM | POA: Diagnosis not present

## 2021-07-03 DIAGNOSIS — I2510 Atherosclerotic heart disease of native coronary artery without angina pectoris: Secondary | ICD-10-CM | POA: Diagnosis not present

## 2021-07-03 DIAGNOSIS — I130 Hypertensive heart and chronic kidney disease with heart failure and stage 1 through stage 4 chronic kidney disease, or unspecified chronic kidney disease: Secondary | ICD-10-CM | POA: Diagnosis not present

## 2021-07-03 DIAGNOSIS — I340 Nonrheumatic mitral (valve) insufficiency: Secondary | ICD-10-CM | POA: Diagnosis not present

## 2021-07-03 DIAGNOSIS — E1122 Type 2 diabetes mellitus with diabetic chronic kidney disease: Secondary | ICD-10-CM | POA: Diagnosis not present

## 2021-07-03 DIAGNOSIS — N183 Chronic kidney disease, stage 3 unspecified: Secondary | ICD-10-CM | POA: Diagnosis not present

## 2021-07-03 DIAGNOSIS — I4819 Other persistent atrial fibrillation: Secondary | ICD-10-CM | POA: Diagnosis not present

## 2021-07-03 DIAGNOSIS — I255 Ischemic cardiomyopathy: Secondary | ICD-10-CM | POA: Diagnosis not present

## 2021-07-04 DIAGNOSIS — N183 Chronic kidney disease, stage 3 unspecified: Secondary | ICD-10-CM | POA: Diagnosis not present

## 2021-07-04 DIAGNOSIS — I4510 Unspecified right bundle-branch block: Secondary | ICD-10-CM | POA: Diagnosis not present

## 2021-07-04 DIAGNOSIS — I255 Ischemic cardiomyopathy: Secondary | ICD-10-CM | POA: Diagnosis not present

## 2021-07-04 DIAGNOSIS — I130 Hypertensive heart and chronic kidney disease with heart failure and stage 1 through stage 4 chronic kidney disease, or unspecified chronic kidney disease: Secondary | ICD-10-CM | POA: Diagnosis not present

## 2021-07-04 DIAGNOSIS — I4819 Other persistent atrial fibrillation: Secondary | ICD-10-CM | POA: Diagnosis not present

## 2021-07-04 DIAGNOSIS — Z95818 Presence of other cardiac implants and grafts: Secondary | ICD-10-CM | POA: Diagnosis not present

## 2021-07-04 DIAGNOSIS — E1122 Type 2 diabetes mellitus with diabetic chronic kidney disease: Secondary | ICD-10-CM | POA: Diagnosis not present

## 2021-07-04 DIAGNOSIS — N179 Acute kidney failure, unspecified: Secondary | ICD-10-CM | POA: Diagnosis not present

## 2021-07-04 DIAGNOSIS — Z7901 Long term (current) use of anticoagulants: Secondary | ICD-10-CM | POA: Diagnosis not present

## 2021-07-04 DIAGNOSIS — I2510 Atherosclerotic heart disease of native coronary artery without angina pectoris: Secondary | ICD-10-CM | POA: Diagnosis not present

## 2021-07-04 DIAGNOSIS — I340 Nonrheumatic mitral (valve) insufficiency: Secondary | ICD-10-CM | POA: Diagnosis not present

## 2021-07-04 DIAGNOSIS — I5023 Acute on chronic systolic (congestive) heart failure: Secondary | ICD-10-CM | POA: Diagnosis not present

## 2021-07-05 DIAGNOSIS — Z95818 Presence of other cardiac implants and grafts: Secondary | ICD-10-CM | POA: Diagnosis not present

## 2021-07-05 DIAGNOSIS — I4819 Other persistent atrial fibrillation: Secondary | ICD-10-CM | POA: Diagnosis not present

## 2021-07-05 DIAGNOSIS — E1122 Type 2 diabetes mellitus with diabetic chronic kidney disease: Secondary | ICD-10-CM | POA: Diagnosis not present

## 2021-07-05 DIAGNOSIS — I5023 Acute on chronic systolic (congestive) heart failure: Secondary | ICD-10-CM | POA: Diagnosis not present

## 2021-07-05 DIAGNOSIS — I340 Nonrheumatic mitral (valve) insufficiency: Secondary | ICD-10-CM | POA: Diagnosis not present

## 2021-07-05 DIAGNOSIS — I2510 Atherosclerotic heart disease of native coronary artery without angina pectoris: Secondary | ICD-10-CM | POA: Diagnosis not present

## 2021-07-05 DIAGNOSIS — R791 Abnormal coagulation profile: Secondary | ICD-10-CM | POA: Diagnosis not present

## 2021-07-05 DIAGNOSIS — J9611 Chronic respiratory failure with hypoxia: Secondary | ICD-10-CM | POA: Diagnosis not present

## 2021-07-05 DIAGNOSIS — N183 Chronic kidney disease, stage 3 unspecified: Secondary | ICD-10-CM | POA: Diagnosis not present

## 2021-07-05 DIAGNOSIS — I130 Hypertensive heart and chronic kidney disease with heart failure and stage 1 through stage 4 chronic kidney disease, or unspecified chronic kidney disease: Secondary | ICD-10-CM | POA: Diagnosis not present

## 2021-07-05 DIAGNOSIS — I4510 Unspecified right bundle-branch block: Secondary | ICD-10-CM | POA: Diagnosis not present

## 2021-07-05 DIAGNOSIS — Z7901 Long term (current) use of anticoagulants: Secondary | ICD-10-CM | POA: Diagnosis not present

## 2021-07-05 DIAGNOSIS — I255 Ischemic cardiomyopathy: Secondary | ICD-10-CM | POA: Diagnosis not present

## 2021-07-06 DIAGNOSIS — I340 Nonrheumatic mitral (valve) insufficiency: Secondary | ICD-10-CM | POA: Diagnosis not present

## 2021-07-06 DIAGNOSIS — R791 Abnormal coagulation profile: Secondary | ICD-10-CM | POA: Diagnosis not present

## 2021-07-06 DIAGNOSIS — Z95818 Presence of other cardiac implants and grafts: Secondary | ICD-10-CM | POA: Diagnosis not present

## 2021-07-06 DIAGNOSIS — I4510 Unspecified right bundle-branch block: Secondary | ICD-10-CM | POA: Diagnosis not present

## 2021-07-06 DIAGNOSIS — I130 Hypertensive heart and chronic kidney disease with heart failure and stage 1 through stage 4 chronic kidney disease, or unspecified chronic kidney disease: Secondary | ICD-10-CM | POA: Diagnosis not present

## 2021-07-06 DIAGNOSIS — I4819 Other persistent atrial fibrillation: Secondary | ICD-10-CM | POA: Diagnosis not present

## 2021-07-06 DIAGNOSIS — I5023 Acute on chronic systolic (congestive) heart failure: Secondary | ICD-10-CM | POA: Diagnosis not present

## 2021-07-06 DIAGNOSIS — N183 Chronic kidney disease, stage 3 unspecified: Secondary | ICD-10-CM | POA: Diagnosis not present

## 2021-07-06 DIAGNOSIS — Z7901 Long term (current) use of anticoagulants: Secondary | ICD-10-CM | POA: Diagnosis not present

## 2021-07-06 DIAGNOSIS — E1122 Type 2 diabetes mellitus with diabetic chronic kidney disease: Secondary | ICD-10-CM | POA: Diagnosis not present

## 2021-07-06 DIAGNOSIS — I255 Ischemic cardiomyopathy: Secondary | ICD-10-CM | POA: Diagnosis not present

## 2021-07-06 DIAGNOSIS — I2510 Atherosclerotic heart disease of native coronary artery without angina pectoris: Secondary | ICD-10-CM | POA: Diagnosis not present

## 2021-07-07 ENCOUNTER — Inpatient Hospital Stay: Payer: Medicare Other | Admitting: Legal Medicine

## 2021-07-07 DIAGNOSIS — Z95818 Presence of other cardiac implants and grafts: Secondary | ICD-10-CM | POA: Diagnosis not present

## 2021-07-07 DIAGNOSIS — I5023 Acute on chronic systolic (congestive) heart failure: Secondary | ICD-10-CM | POA: Diagnosis not present

## 2021-07-07 DIAGNOSIS — E1122 Type 2 diabetes mellitus with diabetic chronic kidney disease: Secondary | ICD-10-CM | POA: Diagnosis not present

## 2021-07-07 DIAGNOSIS — I130 Hypertensive heart and chronic kidney disease with heart failure and stage 1 through stage 4 chronic kidney disease, or unspecified chronic kidney disease: Secondary | ICD-10-CM | POA: Diagnosis not present

## 2021-07-07 DIAGNOSIS — I4510 Unspecified right bundle-branch block: Secondary | ICD-10-CM | POA: Diagnosis not present

## 2021-07-07 DIAGNOSIS — N183 Chronic kidney disease, stage 3 unspecified: Secondary | ICD-10-CM | POA: Diagnosis not present

## 2021-07-07 DIAGNOSIS — I2510 Atherosclerotic heart disease of native coronary artery without angina pectoris: Secondary | ICD-10-CM | POA: Diagnosis not present

## 2021-07-07 DIAGNOSIS — Z7901 Long term (current) use of anticoagulants: Secondary | ICD-10-CM | POA: Diagnosis not present

## 2021-07-07 DIAGNOSIS — I340 Nonrheumatic mitral (valve) insufficiency: Secondary | ICD-10-CM | POA: Diagnosis not present

## 2021-07-07 DIAGNOSIS — I4819 Other persistent atrial fibrillation: Secondary | ICD-10-CM | POA: Diagnosis not present

## 2021-07-07 DIAGNOSIS — I255 Ischemic cardiomyopathy: Secondary | ICD-10-CM | POA: Diagnosis not present

## 2021-07-07 NOTE — Progress Notes (Deleted)
? ?Subjective:  ?Patient ID: Steven Hester, male    DOB: Jul 19, 1945  Age: 76 y.o. MRN: [id number] ? ?No chief complaint on file. ? ? ?HPI ?  ?Patient was admitted on [facility] on 06/18/2021 and discharged on  ?Current Outpatient Medications on File Prior to Visit  ?Medication Sig Dispense Refill  ? ACCU-CHEK GUIDE test strip USE 1  TWICE DAILY 100 each 0  ? amiodarone (PACERONE) 200 MG tablet Take 200 mg by mouth daily.    ? atorvastatin (LIPITOR) 40 MG tablet Take 1 tablet by mouth once daily 90 tablet 2  ? carvedilol (COREG) 3.125 MG tablet Take 3.125 mg by mouth 2 (two) times daily.    ? furosemide (LASIX) 40 MG tablet Take 1.5 tablets (60 mg total) by mouth 2 (two) times daily. 30 tablet 0  ? guaiFENesin-codeine (CHERATUSSIN AC) 100-10 MG/5ML syrup Take 5 mLs by mouth 3 (three) times daily as needed. 120 mL 0  ? isosorbide mononitrate (IMDUR) 30 MG 24 hr tablet Take 30 mg by mouth daily.    ? nitroGLYCERIN (NITROSTAT) 0.4 MG SL tablet Place under the tongue.    ? ranolazine (RANEXA) 500 MG 12 hr tablet Take 500 mg by mouth 2 (two) times daily.    ? Sennosides (LAXATIVE) 25 MG TABS Take 25 mg by mouth daily.    ? warfarin (COUMADIN) 2.5 MG tablet Take 2.5 mg by mouth See admin instructions. Take 2.5mg  by mouth once daily on Wednesday and Friday    ? warfarin (COUMADIN) 5 MG tablet Take 5 mg by mouth See admin instructions. Take 5mg  by mouth once daily every Sunday, Monday, Tuesday, Thursday, Saturday    ? zolpidem (AMBIEN) 5 MG tablet Take 1 tablet (5 mg total) by mouth at bedtime as needed for sleep. 15 tablet 1  ? ?No current facility-administered medications on file prior to visit.  ? ?Past Medical History:  ?Diagnosis Date  ? CAD (coronary artery disease)   ? DES to RCA, circumflex, OM in [place] 2007  ? CHF (congestive heart failure) ([place])   ? CKD (chronic kidney disease) stage 4, GFR 15-29 ml/min (HCC)   ? Essential hypertension   ? Inferior myocardial infarction [facility])   ? Ischemic  cardiomyopathy   ? Mitral regurgitation   ? Mixed hyperlipidemia   ? Pacemaker   ? Medtronic  ? Persistent atrial fibrillation ([place])   ? Sinus node dysfunction (HCC)   ? Type 2 diabetes mellitus ([place])   ? ?Past Surgical History:  ?Procedure Laterality Date  ? COLON SURGERY    ? CORONARY ANGIOPLASTY WITH STENT PLACEMENT    ? PACEMAKER INSERTION    ?  ?Family History  ?Problem Relation Age of Onset  ? Heart attack Mother   ? GI Disease Father   ? Cancer Father   ? ?Social History  ? ?Socioeconomic History  ? Marital status: Married  ?  Spouse name: Silva Bandy  ? Number of children: 1  ? Years of education: Not on file  ? Highest education level: Not on file  ?Occupational History  ? Not on file  ?Tobacco Use  ? Smoking status: Former  ?  Packs/day: 1.00  ?  Years: 10.00  ?  Pack years: 10.00  ?  Types: Cigarettes  ? Smokeless tobacco: Never  ?Vaping Use  ? Vaping Use: Never used  ?Substance and Sexual Activity  ? Alcohol use: Yes  ?  Comment: occasionally  ? Drug use: Never  ? Sexual activity:  Not Currently  ?  Partners: Female  ?Other Topics Concern  ? Not on file  ?Social History Narrative  ? Not on file  ? ?Social Determinants of Health  ? ?Financial Resource Strain: Not on file  ?Food Insecurity: No Food Insecurity  ? Worried About Charity fundraiser in the Last Year: Never true  ? Ran Out of Food in the Last Year: Never true  ?Transportation Needs: No Transportation Needs  ? Lack of Transportation (Medical): No  ? Lack of Transportation (Non-Medical): No  ?Physical Activity: Not on file  ?Stress: Not on file  ?Social Connections: Not on file  ? ? ?Review of Systems ? ? ?Objective:  ?There were no vitals taken for this visit. ? ?BP/Weight 05/11/2021 05/05/2021 05/04/2021  ?Systolic BP - 707 867  ?Diastolic BP - 62 70  ?Wt. (Lbs) 272 272.4 274  ?BMI 35.89 35.94 36.15  ? ? ?Physical Exam ? ?Diabetic Foot Exam - Simple   ?No data filed ?  ?  ? ?Lab Results  ?Component Value Date  ? WBC 7.4 05/01/2021  ? HGB 10.9 (L)  05/01/2021  ? HCT 35.1 (L) 05/01/2021  ? PLT 133 (L) 05/01/2021  ? GLUCOSE 110 (H) 05/04/2021  ? CHOL 127 12/29/2020  ? TRIG 125 12/29/2020  ? HDL 31 (L) 12/29/2020  ? [place] 73 12/29/2020  ? ALT 16 05/04/2021  ? AST 19 05/04/2021  ? NA 141 05/04/2021  ? K 3.5 05/04/2021  ? CL 94 (L) 05/04/2021  ? CREATININE 3.41 (H) 05/04/2021  ? BUN 54 (H) 05/04/2021  ? CO2 29 05/04/2021  ? INR 3.7 (H) 05/02/2021  ? HGBA1C 6.1 (H) 12/29/2020  ? ? ? ? ?Assessment & Plan:  ? ?Problem List Items Addressed This Visit   ?None ?Visit Diagnoses   ? ? Nonrheumatic mitral valve regurgitation    -  Primary  ? ?  ?. ? ?No orders of the defined types were placed in this encounter. ? ? ?No orders of the defined types were placed in this encounter. ?  ? ?Follow-up: No follow-ups on file. ? ?An After Visit Summary was printed and given to the patient. ? ?Reinaldo Meeker, MD ?[place] ?[phone] ?

## 2021-07-08 NOTE — Chronic Care Management (AMB) (Signed)
?  Care Management  ? ?Note ? ?07/08/2021 ?Name: Steven Hester MRN: [id number] DOB: 10-12-1945 ? ?[name] Casa is a 76 y.o. year old male who is a primary care patient of Lillard Anes, MD and is actively engaged with the care management team. I reached out to [name] by phone today to assist with re-scheduling a follow up visit with the RN Case Manager ? ?Follow up plan: ?A third unsuccessful telephone outreach attempt made. A HIPAA compliant phone message was left for the patient providing contact information and requesting a return call. Unable to make contact on outreach attempts x 3. PCP Dr. Henrene Pastor notified via routed documentation in medical record. We have been unable to make contact with the patient for follow up. The care management team is available to follow up with the patient after provider conversation with the patient regarding recommendation for care management engagement and subsequent re-referral to the care management team.  ? ?Laverda Sorenson  ?Care Guide, Embedded Care Coordination ?[place]  Care Management  ?Direct Dial: [phone] ? ?

## 2021-07-18 DIAGNOSIS — Z9889 Other specified postprocedural states: Secondary | ICD-10-CM | POA: Diagnosis not present

## 2021-07-18 DIAGNOSIS — I2510 Atherosclerotic heart disease of native coronary artery without angina pectoris: Secondary | ICD-10-CM | POA: Diagnosis not present

## 2021-07-18 DIAGNOSIS — I4819 Other persistent atrial fibrillation: Secondary | ICD-10-CM | POA: Diagnosis not present

## 2021-07-18 DIAGNOSIS — I130 Hypertensive heart and chronic kidney disease with heart failure and stage 1 through stage 4 chronic kidney disease, or unspecified chronic kidney disease: Secondary | ICD-10-CM | POA: Diagnosis not present

## 2021-07-18 DIAGNOSIS — I480 Paroxysmal atrial fibrillation: Secondary | ICD-10-CM | POA: Diagnosis not present

## 2021-07-18 DIAGNOSIS — I452 Bifascicular block: Secondary | ICD-10-CM | POA: Diagnosis not present

## 2021-07-18 DIAGNOSIS — Z95818 Presence of other cardiac implants and grafts: Secondary | ICD-10-CM | POA: Insufficient documentation

## 2021-07-18 DIAGNOSIS — Z950 Presence of cardiac pacemaker: Secondary | ICD-10-CM | POA: Diagnosis not present

## 2021-07-18 DIAGNOSIS — Z955 Presence of coronary angioplasty implant and graft: Secondary | ICD-10-CM | POA: Diagnosis not present

## 2021-07-18 DIAGNOSIS — E119 Type 2 diabetes mellitus without complications: Secondary | ICD-10-CM | POA: Diagnosis not present

## 2021-07-18 DIAGNOSIS — I255 Ischemic cardiomyopathy: Secondary | ICD-10-CM | POA: Diagnosis not present

## 2021-07-18 DIAGNOSIS — I340 Nonrheumatic mitral (valve) insufficiency: Secondary | ICD-10-CM | POA: Diagnosis not present

## 2021-07-18 DIAGNOSIS — Z9989 Dependence on other enabling machines and devices: Secondary | ICD-10-CM | POA: Diagnosis not present

## 2021-07-18 DIAGNOSIS — Z8616 Personal history of COVID-19: Secondary | ICD-10-CM | POA: Diagnosis not present

## 2021-07-18 DIAGNOSIS — I5022 Chronic systolic (congestive) heart failure: Secondary | ICD-10-CM | POA: Diagnosis not present

## 2021-07-18 DIAGNOSIS — I110 Hypertensive heart disease with heart failure: Secondary | ICD-10-CM | POA: Diagnosis not present

## 2021-07-18 DIAGNOSIS — I252 Old myocardial infarction: Secondary | ICD-10-CM | POA: Diagnosis not present

## 2021-07-18 DIAGNOSIS — Z7901 Long term (current) use of anticoagulants: Secondary | ICD-10-CM | POA: Diagnosis not present

## 2021-07-18 DIAGNOSIS — E785 Hyperlipidemia, unspecified: Secondary | ICD-10-CM | POA: Diagnosis not present

## 2021-07-18 DIAGNOSIS — G4733 Obstructive sleep apnea (adult) (pediatric): Secondary | ICD-10-CM | POA: Diagnosis not present

## 2021-07-18 DIAGNOSIS — M170 Bilateral primary osteoarthritis of knee: Secondary | ICD-10-CM | POA: Diagnosis not present

## 2021-07-18 DIAGNOSIS — R5381 Other malaise: Secondary | ICD-10-CM | POA: Diagnosis not present

## 2021-07-18 DIAGNOSIS — Z87891 Personal history of nicotine dependence: Secondary | ICD-10-CM | POA: Diagnosis not present

## 2021-07-20 DIAGNOSIS — R5381 Other malaise: Secondary | ICD-10-CM | POA: Diagnosis not present

## 2021-07-20 DIAGNOSIS — I2510 Atherosclerotic heart disease of native coronary artery without angina pectoris: Secondary | ICD-10-CM | POA: Diagnosis not present

## 2021-07-20 DIAGNOSIS — Z8616 Personal history of COVID-19: Secondary | ICD-10-CM | POA: Diagnosis not present

## 2021-07-20 DIAGNOSIS — I340 Nonrheumatic mitral (valve) insufficiency: Secondary | ICD-10-CM | POA: Diagnosis not present

## 2021-07-20 DIAGNOSIS — I5022 Chronic systolic (congestive) heart failure: Secondary | ICD-10-CM | POA: Diagnosis not present

## 2021-07-20 DIAGNOSIS — G4733 Obstructive sleep apnea (adult) (pediatric): Secondary | ICD-10-CM | POA: Diagnosis not present

## 2021-07-20 DIAGNOSIS — M170 Bilateral primary osteoarthritis of knee: Secondary | ICD-10-CM | POA: Diagnosis not present

## 2021-07-20 DIAGNOSIS — Z950 Presence of cardiac pacemaker: Secondary | ICD-10-CM | POA: Diagnosis not present

## 2021-07-20 DIAGNOSIS — E119 Type 2 diabetes mellitus without complications: Secondary | ICD-10-CM | POA: Diagnosis not present

## 2021-07-20 DIAGNOSIS — Z87891 Personal history of nicotine dependence: Secondary | ICD-10-CM | POA: Diagnosis not present

## 2021-07-20 DIAGNOSIS — Z955 Presence of coronary angioplasty implant and graft: Secondary | ICD-10-CM | POA: Diagnosis not present

## 2021-07-20 DIAGNOSIS — I480 Paroxysmal atrial fibrillation: Secondary | ICD-10-CM | POA: Diagnosis not present

## 2021-07-20 DIAGNOSIS — Z7901 Long term (current) use of anticoagulants: Secondary | ICD-10-CM | POA: Diagnosis not present

## 2021-07-20 DIAGNOSIS — E785 Hyperlipidemia, unspecified: Secondary | ICD-10-CM | POA: Diagnosis not present

## 2021-07-20 DIAGNOSIS — Z9989 Dependence on other enabling machines and devices: Secondary | ICD-10-CM | POA: Diagnosis not present

## 2021-07-20 DIAGNOSIS — I110 Hypertensive heart disease with heart failure: Secondary | ICD-10-CM | POA: Diagnosis not present

## 2021-07-25 DIAGNOSIS — Z952 Presence of prosthetic heart valve: Secondary | ICD-10-CM | POA: Diagnosis not present

## 2021-07-25 DIAGNOSIS — Z9889 Other specified postprocedural states: Secondary | ICD-10-CM | POA: Diagnosis not present

## 2021-07-25 DIAGNOSIS — I083 Combined rheumatic disorders of mitral, aortic and tricuspid valves: Secondary | ICD-10-CM | POA: Diagnosis not present

## 2021-07-27 DIAGNOSIS — I340 Nonrheumatic mitral (valve) insufficiency: Secondary | ICD-10-CM

## 2021-07-27 DIAGNOSIS — E119 Type 2 diabetes mellitus without complications: Secondary | ICD-10-CM | POA: Diagnosis not present

## 2021-07-27 DIAGNOSIS — Z9989 Dependence on other enabling machines and devices: Secondary | ICD-10-CM

## 2021-07-27 DIAGNOSIS — R5381 Other malaise: Secondary | ICD-10-CM

## 2021-07-27 DIAGNOSIS — M170 Bilateral primary osteoarthritis of knee: Secondary | ICD-10-CM

## 2021-07-27 DIAGNOSIS — I480 Paroxysmal atrial fibrillation: Secondary | ICD-10-CM | POA: Diagnosis not present

## 2021-07-27 DIAGNOSIS — I2510 Atherosclerotic heart disease of native coronary artery without angina pectoris: Secondary | ICD-10-CM | POA: Diagnosis not present

## 2021-07-27 DIAGNOSIS — I5022 Chronic systolic (congestive) heart failure: Secondary | ICD-10-CM | POA: Diagnosis not present

## 2021-07-27 DIAGNOSIS — E785 Hyperlipidemia, unspecified: Secondary | ICD-10-CM

## 2021-07-27 DIAGNOSIS — I110 Hypertensive heart disease with heart failure: Secondary | ICD-10-CM

## 2021-07-27 DIAGNOSIS — G4733 Obstructive sleep apnea (adult) (pediatric): Secondary | ICD-10-CM

## 2021-07-27 DIAGNOSIS — Z7901 Long term (current) use of anticoagulants: Secondary | ICD-10-CM

## 2021-07-28 ENCOUNTER — Telehealth: Payer: Self-pay

## 2021-07-28 DIAGNOSIS — I110 Hypertensive heart disease with heart failure: Secondary | ICD-10-CM | POA: Diagnosis not present

## 2021-07-28 DIAGNOSIS — I5022 Chronic systolic (congestive) heart failure: Secondary | ICD-10-CM | POA: Diagnosis not present

## 2021-07-28 DIAGNOSIS — Z955 Presence of coronary angioplasty implant and graft: Secondary | ICD-10-CM | POA: Diagnosis not present

## 2021-07-28 DIAGNOSIS — E785 Hyperlipidemia, unspecified: Secondary | ICD-10-CM | POA: Diagnosis not present

## 2021-07-28 DIAGNOSIS — I480 Paroxysmal atrial fibrillation: Secondary | ICD-10-CM | POA: Diagnosis not present

## 2021-07-28 DIAGNOSIS — Z950 Presence of cardiac pacemaker: Secondary | ICD-10-CM | POA: Diagnosis not present

## 2021-07-28 DIAGNOSIS — Z87891 Personal history of nicotine dependence: Secondary | ICD-10-CM | POA: Diagnosis not present

## 2021-07-28 DIAGNOSIS — Z7901 Long term (current) use of anticoagulants: Secondary | ICD-10-CM | POA: Diagnosis not present

## 2021-07-28 DIAGNOSIS — Z9989 Dependence on other enabling machines and devices: Secondary | ICD-10-CM | POA: Diagnosis not present

## 2021-07-28 DIAGNOSIS — M170 Bilateral primary osteoarthritis of knee: Secondary | ICD-10-CM | POA: Diagnosis not present

## 2021-07-28 DIAGNOSIS — G4733 Obstructive sleep apnea (adult) (pediatric): Secondary | ICD-10-CM | POA: Diagnosis not present

## 2021-07-28 DIAGNOSIS — R5381 Other malaise: Secondary | ICD-10-CM | POA: Diagnosis not present

## 2021-07-28 DIAGNOSIS — Z8616 Personal history of COVID-19: Secondary | ICD-10-CM | POA: Diagnosis not present

## 2021-07-28 DIAGNOSIS — I340 Nonrheumatic mitral (valve) insufficiency: Secondary | ICD-10-CM | POA: Diagnosis not present

## 2021-07-28 DIAGNOSIS — E119 Type 2 diabetes mellitus without complications: Secondary | ICD-10-CM | POA: Diagnosis not present

## 2021-07-28 DIAGNOSIS — I2510 Atherosclerotic heart disease of native coronary artery without angina pectoris: Secondary | ICD-10-CM | POA: Diagnosis not present

## 2021-07-28 NOTE — Telephone Encounter (Signed)
Home Health nurse called to report that patient fell from the recliner because he lost his balance. He denied any injury, contusion, pain. ? ?She also report that he gained some pound from 263 to 276 lbs. He does not have any chest pain, sob. She will call cardiologist because his fluids pills were prescribed for him. ?

## 2021-07-31 DIAGNOSIS — Z4509 Encounter for adjustment and management of other cardiac device: Secondary | ICD-10-CM | POA: Diagnosis not present

## 2021-08-02 DIAGNOSIS — I480 Paroxysmal atrial fibrillation: Secondary | ICD-10-CM | POA: Diagnosis not present

## 2021-08-02 DIAGNOSIS — Z950 Presence of cardiac pacemaker: Secondary | ICD-10-CM | POA: Diagnosis not present

## 2021-08-02 DIAGNOSIS — I5022 Chronic systolic (congestive) heart failure: Secondary | ICD-10-CM | POA: Diagnosis not present

## 2021-08-02 DIAGNOSIS — M170 Bilateral primary osteoarthritis of knee: Secondary | ICD-10-CM | POA: Diagnosis not present

## 2021-08-02 DIAGNOSIS — I340 Nonrheumatic mitral (valve) insufficiency: Secondary | ICD-10-CM | POA: Diagnosis not present

## 2021-08-02 DIAGNOSIS — Z955 Presence of coronary angioplasty implant and graft: Secondary | ICD-10-CM | POA: Diagnosis not present

## 2021-08-02 DIAGNOSIS — R5381 Other malaise: Secondary | ICD-10-CM | POA: Diagnosis not present

## 2021-08-02 DIAGNOSIS — Z9989 Dependence on other enabling machines and devices: Secondary | ICD-10-CM | POA: Diagnosis not present

## 2021-08-02 DIAGNOSIS — I4819 Other persistent atrial fibrillation: Secondary | ICD-10-CM | POA: Diagnosis not present

## 2021-08-02 DIAGNOSIS — E876 Hypokalemia: Secondary | ICD-10-CM | POA: Diagnosis not present

## 2021-08-02 DIAGNOSIS — Z7901 Long term (current) use of anticoagulants: Secondary | ICD-10-CM | POA: Diagnosis not present

## 2021-08-02 DIAGNOSIS — E119 Type 2 diabetes mellitus without complications: Secondary | ICD-10-CM | POA: Diagnosis not present

## 2021-08-02 DIAGNOSIS — Z87891 Personal history of nicotine dependence: Secondary | ICD-10-CM | POA: Diagnosis not present

## 2021-08-02 DIAGNOSIS — I2510 Atherosclerotic heart disease of native coronary artery without angina pectoris: Secondary | ICD-10-CM | POA: Diagnosis not present

## 2021-08-02 DIAGNOSIS — G4733 Obstructive sleep apnea (adult) (pediatric): Secondary | ICD-10-CM | POA: Diagnosis not present

## 2021-08-02 DIAGNOSIS — E785 Hyperlipidemia, unspecified: Secondary | ICD-10-CM | POA: Diagnosis not present

## 2021-08-02 DIAGNOSIS — I110 Hypertensive heart disease with heart failure: Secondary | ICD-10-CM | POA: Diagnosis not present

## 2021-08-02 DIAGNOSIS — Z8616 Personal history of COVID-19: Secondary | ICD-10-CM | POA: Diagnosis not present

## 2021-08-03 DIAGNOSIS — Z9989 Dependence on other enabling machines and devices: Secondary | ICD-10-CM | POA: Diagnosis not present

## 2021-08-03 DIAGNOSIS — Z950 Presence of cardiac pacemaker: Secondary | ICD-10-CM | POA: Diagnosis not present

## 2021-08-03 DIAGNOSIS — Z7901 Long term (current) use of anticoagulants: Secondary | ICD-10-CM | POA: Diagnosis not present

## 2021-08-03 DIAGNOSIS — I110 Hypertensive heart disease with heart failure: Secondary | ICD-10-CM | POA: Diagnosis not present

## 2021-08-03 DIAGNOSIS — J9611 Chronic respiratory failure with hypoxia: Secondary | ICD-10-CM | POA: Diagnosis not present

## 2021-08-03 DIAGNOSIS — I340 Nonrheumatic mitral (valve) insufficiency: Secondary | ICD-10-CM | POA: Diagnosis not present

## 2021-08-03 DIAGNOSIS — Z955 Presence of coronary angioplasty implant and graft: Secondary | ICD-10-CM | POA: Diagnosis not present

## 2021-08-03 DIAGNOSIS — I5022 Chronic systolic (congestive) heart failure: Secondary | ICD-10-CM | POA: Diagnosis not present

## 2021-08-03 DIAGNOSIS — I480 Paroxysmal atrial fibrillation: Secondary | ICD-10-CM | POA: Diagnosis not present

## 2021-08-03 DIAGNOSIS — I2510 Atherosclerotic heart disease of native coronary artery without angina pectoris: Secondary | ICD-10-CM | POA: Diagnosis not present

## 2021-08-03 DIAGNOSIS — M170 Bilateral primary osteoarthritis of knee: Secondary | ICD-10-CM | POA: Diagnosis not present

## 2021-08-03 DIAGNOSIS — Z87891 Personal history of nicotine dependence: Secondary | ICD-10-CM | POA: Diagnosis not present

## 2021-08-03 DIAGNOSIS — E119 Type 2 diabetes mellitus without complications: Secondary | ICD-10-CM | POA: Diagnosis not present

## 2021-08-03 DIAGNOSIS — R5381 Other malaise: Secondary | ICD-10-CM | POA: Diagnosis not present

## 2021-08-03 DIAGNOSIS — G4733 Obstructive sleep apnea (adult) (pediatric): Secondary | ICD-10-CM | POA: Diagnosis not present

## 2021-08-03 DIAGNOSIS — E785 Hyperlipidemia, unspecified: Secondary | ICD-10-CM | POA: Diagnosis not present

## 2021-08-03 DIAGNOSIS — Z8616 Personal history of COVID-19: Secondary | ICD-10-CM | POA: Diagnosis not present

## 2021-08-05 DIAGNOSIS — J9611 Chronic respiratory failure with hypoxia: Secondary | ICD-10-CM | POA: Diagnosis not present

## 2021-08-09 ENCOUNTER — Encounter: Payer: Self-pay | Admitting: Legal Medicine

## 2021-08-09 ENCOUNTER — Telehealth: Payer: Self-pay

## 2021-08-09 ENCOUNTER — Ambulatory Visit (INDEPENDENT_AMBULATORY_CARE_PROVIDER_SITE_OTHER): Payer: Medicare Other | Admitting: Legal Medicine

## 2021-08-09 VITALS — BP 108/62 | HR 63 | Temp 97.3°F | Ht 73.0 in | Wt 289.4 lb

## 2021-08-09 DIAGNOSIS — Z9989 Dependence on other enabling machines and devices: Secondary | ICD-10-CM | POA: Diagnosis not present

## 2021-08-09 DIAGNOSIS — Z6838 Body mass index (BMI) 38.0-38.9, adult: Secondary | ICD-10-CM

## 2021-08-09 DIAGNOSIS — E782 Mixed hyperlipidemia: Secondary | ICD-10-CM | POA: Diagnosis not present

## 2021-08-09 DIAGNOSIS — I82409 Acute embolism and thrombosis of unspecified deep veins of unspecified lower extremity: Secondary | ICD-10-CM

## 2021-08-09 DIAGNOSIS — E1151 Type 2 diabetes mellitus with diabetic peripheral angiopathy without gangrene: Secondary | ICD-10-CM

## 2021-08-09 DIAGNOSIS — Z87891 Personal history of nicotine dependence: Secondary | ICD-10-CM | POA: Diagnosis not present

## 2021-08-09 DIAGNOSIS — Z955 Presence of coronary angioplasty implant and graft: Secondary | ICD-10-CM | POA: Diagnosis not present

## 2021-08-09 DIAGNOSIS — I480 Paroxysmal atrial fibrillation: Secondary | ICD-10-CM | POA: Diagnosis not present

## 2021-08-09 DIAGNOSIS — E441 Mild protein-calorie malnutrition: Secondary | ICD-10-CM | POA: Diagnosis not present

## 2021-08-09 DIAGNOSIS — D6869 Other thrombophilia: Secondary | ICD-10-CM | POA: Diagnosis not present

## 2021-08-09 DIAGNOSIS — I5022 Chronic systolic (congestive) heart failure: Secondary | ICD-10-CM | POA: Diagnosis not present

## 2021-08-09 DIAGNOSIS — Z7901 Long term (current) use of anticoagulants: Secondary | ICD-10-CM | POA: Diagnosis not present

## 2021-08-09 DIAGNOSIS — I340 Nonrheumatic mitral (valve) insufficiency: Secondary | ICD-10-CM | POA: Diagnosis not present

## 2021-08-09 DIAGNOSIS — I110 Hypertensive heart disease with heart failure: Secondary | ICD-10-CM | POA: Diagnosis not present

## 2021-08-09 DIAGNOSIS — Z8616 Personal history of COVID-19: Secondary | ICD-10-CM | POA: Diagnosis not present

## 2021-08-09 DIAGNOSIS — I4819 Other persistent atrial fibrillation: Secondary | ICD-10-CM | POA: Diagnosis not present

## 2021-08-09 DIAGNOSIS — M1991 Primary osteoarthritis, unspecified site: Secondary | ICD-10-CM

## 2021-08-09 DIAGNOSIS — E785 Hyperlipidemia, unspecified: Secondary | ICD-10-CM | POA: Diagnosis not present

## 2021-08-09 DIAGNOSIS — I10 Essential (primary) hypertension: Secondary | ICD-10-CM | POA: Diagnosis not present

## 2021-08-09 DIAGNOSIS — R5381 Other malaise: Secondary | ICD-10-CM | POA: Diagnosis not present

## 2021-08-09 DIAGNOSIS — E119 Type 2 diabetes mellitus without complications: Secondary | ICD-10-CM | POA: Diagnosis not present

## 2021-08-09 DIAGNOSIS — I2510 Atherosclerotic heart disease of native coronary artery without angina pectoris: Secondary | ICD-10-CM

## 2021-08-09 DIAGNOSIS — G4733 Obstructive sleep apnea (adult) (pediatric): Secondary | ICD-10-CM | POA: Diagnosis not present

## 2021-08-09 DIAGNOSIS — I130 Hypertensive heart and chronic kidney disease with heart failure and stage 1 through stage 4 chronic kidney disease, or unspecified chronic kidney disease: Secondary | ICD-10-CM

## 2021-08-09 DIAGNOSIS — D649 Anemia, unspecified: Secondary | ICD-10-CM | POA: Diagnosis not present

## 2021-08-09 DIAGNOSIS — Z950 Presence of cardiac pacemaker: Secondary | ICD-10-CM | POA: Diagnosis not present

## 2021-08-09 DIAGNOSIS — M170 Bilateral primary osteoarthritis of knee: Secondary | ICD-10-CM | POA: Diagnosis not present

## 2021-08-09 DIAGNOSIS — I5020 Unspecified systolic (congestive) heart failure: Secondary | ICD-10-CM | POA: Diagnosis not present

## 2021-08-09 MED ORDER — ACETAMINOPHEN-CODEINE #3 300-30 MG PO TABS: 2.0000 | ORAL_TABLET | ORAL | 3 refills | Status: AC | PRN

## 2021-08-09 MED ORDER — ZOLPIDEM TARTRATE 5 MG PO TABS: 5.0000 mg | ORAL_TABLET | Freq: Every evening | ORAL | 3 refills | Status: DC | PRN

## 2021-08-09 NOTE — Telephone Encounter (Signed)
[facility] called and stated they will only be about to give patient a 7 day supply of pain medication because it's been awhile since he has had medication filled there. The will need another rx after the 7 days supply please. ?

## 2021-08-09 NOTE — Telephone Encounter (Signed)
They have no legal right to withhold medicine ?lp ?

## 2021-08-09 NOTE — Progress Notes (Addendum)
Subjective:  Patient ID: Steven Hester, male    DOB: 12/10/1945  Age: 76 y.o. MRN: [id number]  Chief Complaint  Patient presents with   Diabetes   Hypertension   Patient was in hospital 06/18/21 for chest pain and heart failure, rheumatic mitral disease. LVEF 30-35%, No PCI.  Can walk in house, can walk outside houseBNP 452 in hospital,   Diabetes Pertinent negatives for hypoglycemia include no confusion, dizziness, headaches, nervousness/anxiousness or seizures. Pertinent negatives for diabetes include no chest pain, no fatigue, no polydipsia, no polyphagia, no polyuria and no weakness.  Hypertension Pertinent negatives include no chest pain, headaches, neck pain, palpitations or shortness of breath.   Last A1c 6.2,   HFrEF no pedal edema he has orthopnea and Pdn uses recliner.   Current Outpatient Medications on File Prior to Visit  Medication Sig Dispense Refill   ACCU-CHEK GUIDE test strip USE 1  TWICE DAILY 100 each 0   amiodarone (PACERONE) 200 MG tablet Take 200 mg by mouth daily.     aspirin 81 MG EC tablet Take 1 tablet by mouth daily.     atorvastatin (LIPITOR) 40 MG tablet Take 1 tablet by mouth once daily 90 tablet 2   carvedilol (COREG) 3.125 MG tablet Take 3.125 mg by mouth 2 (two) times daily.     furosemide (LASIX) 40 MG tablet Take 1.5 tablets (60 mg total) by mouth 2 (two) times daily. 30 tablet 0   guaiFENesin-codeine (CHERATUSSIN AC) 100-10 MG/5ML syrup Take 5 mLs by mouth 3 (three) times daily as needed. 120 mL 0   isosorbide mononitrate (IMDUR) 30 MG 24 hr tablet Take 30 mg by mouth daily.     nitroGLYCERIN (NITROSTAT) 0.4 MG SL tablet Place under the tongue.     ranolazine (RANEXA) 500 MG 12 hr tablet Take 500 mg by mouth 2 (two) times daily.     Sennosides (LAXATIVE) 25 MG TABS Take 25 mg by mouth daily.     warfarin (COUMADIN) 2.5 MG tablet Take 2.5 mg by mouth See admin instructions. Take 2.'5mg'$  by mouth once daily on Wednesday and Friday     warfarin  (COUMADIN) 5 MG tablet Take 5 mg by mouth See admin instructions. Take '5mg'$  by mouth once daily every Sunday, Monday, Tuesday, Thursday, Saturday     No current facility-administered medications on file prior to visit.   Past Medical History:  Diagnosis Date   CAD (coronary artery disease)    DES to RCA, circumflex, OM in [place]   CHF (congestive heart failure) (HCC)    CKD (chronic kidney disease) stage 4, GFR 15-29 ml/min (HCC)    Essential hypertension    Inferior myocardial infarction (HCC)    Ischemic cardiomyopathy    Mitral regurgitation    Mixed hyperlipidemia    Pacemaker    Medtronic   Persistent atrial fibrillation (HCC)    Sinus node dysfunction (HCC)    Type 2 diabetes mellitus ([place])    Past Surgical History:  Procedure Laterality Date   COLON SURGERY     CORONARY ANGIOPLASTY WITH STENT PLACEMENT     PACEMAKER INSERTION      Family History  Problem Relation Age of Onset   Heart attack Mother    GI Disease Father    Cancer Father    Social History   Socioeconomic History   Marital status: Married    Spouse name: Silva Bandy   Number of children: 1   Years of education: Not on file  Highest education level: Not on file  Occupational History   Not on file  Tobacco Use   Smoking status: Former    Packs/day: 1.00    Years: 10.00    Pack years: 10.00    Types: Cigarettes   Smokeless tobacco: Never  Vaping Use   Vaping Use: Never used  Substance and Sexual Activity   Alcohol use: Yes    Comment: occasionally   Drug use: Never   Sexual activity: Not Currently    Partners: Female  Other Topics Concern   Not on file  Social History Narrative   Not on file   Social Determinants of Health   Financial Resource Strain: Not on file  Food Insecurity: No Food Insecurity   Worried About Running Out of Food in the Last Year: Never true   Ran Out of Food in the Last Year: Never true  Transportation Needs: No Transportation Needs   Lack of  Transportation (Medical): No   Lack of Transportation (Non-Medical): No  Physical Activity: Not on file  Stress: Not on file  Social Connections: Not on file    Review of Systems  Constitutional:  Negative for chills, fatigue, fever and unexpected weight change.  HENT:  Negative for congestion, rhinorrhea, sinus pressure, sneezing and sore throat.   Eyes:  Negative for discharge and visual disturbance.  Respiratory:  Negative for cough, shortness of breath and wheezing.   Cardiovascular:  Negative for chest pain and palpitations.  Gastrointestinal:  Negative for abdominal pain, diarrhea, nausea and vomiting.  Endocrine: Negative for polydipsia, polyphagia and polyuria.  Genitourinary:  Negative for decreased urine volume, difficulty urinating, dysuria, frequency, penile swelling and urgency.  Musculoskeletal:  Negative for back pain, gait problem, joint swelling, neck pain and neck stiffness.  Neurological:  Negative for dizziness, seizures, weakness, numbness and headaches.  Psychiatric/Behavioral:  Negative for confusion, hallucinations, sleep disturbance and suicidal ideas. The patient is not nervous/anxious and is not hyperactive.     Objective:  BP 108/62   Pulse 63   Temp (!) 97.3 F (36.3 C)   Ht '6\' 1"'$  (1.854 m)   Wt 289 lb 6.4 oz (131.3 kg)   SpO2 96%   BMI 38.18 kg/m      08/09/2021    7:28 AM 05/11/2021    3:06 PM 05/05/2021    9:00 AM  BP/Weight  Systolic BP 932  355  Diastolic BP 62  62  Wt. (Lbs) 289.4 272 272.4  BMI 38.18 kg/m2 35.89 kg/m2 35.94 kg/m2    Physical Exam Vitals reviewed.  Constitutional:      General: He is not in acute distress.    Appearance: Normal appearance.     Comments: Uses cane  HENT:     Head: Normocephalic.     Right Ear: Tympanic membrane normal.     Left Ear: Tympanic membrane normal.     Nose: Nose normal.     Mouth/Throat:     Mouth: Mucous membranes are moist.  Eyes:     Extraocular Movements: Extraocular movements  intact.     Conjunctiva/sclera: Conjunctivae normal.     Pupils: Pupils are equal, round, and reactive to light.  Cardiovascular:     Rate and Rhythm: Normal rate. Rhythm irregular.     Pulses: Normal pulses.     Heart sounds: Murmur (mr) heard.    Gallop: s4.  Pulmonary:     Effort: Pulmonary effort is normal. No respiratory distress.     Breath sounds: Normal breath  sounds. No wheezing.  Abdominal:     General: Abdomen is flat. Bowel sounds are normal. There is no distension.     Palpations: Abdomen is soft.     Tenderness: There is no abdominal tenderness.  Musculoskeletal:     Cervical back: Normal range of motion and neck supple.     Right lower leg: Edema present.     Left lower leg: Edema present.     Comments: Walks with cane  Skin:    General: Skin is warm.     Capillary Refill: Capillary refill takes less than 2 seconds.     Coloration: Skin is jaundiced.  Neurological:     General: No focal deficit present.     Mental Status: He is alert. Mental status is at baseline.     Gait: Gait normal.     Deep Tendon Reflexes: Reflexes normal.  Psychiatric:        Mood and Affect: Mood normal.        Thought Content: Thought content normal.        Judgment: Judgment normal.    Diabetic Foot Exam - Simple   Simple Foot Form Diabetic Foot exam was performed with the following findings: Yes 08/09/2021  8:15 AM  Visual Inspection No deformities, no ulcerations, no other skin breakdown bilaterally: Yes Sensation Testing See comments: Yes Pulse Check See comments: Yes Comments Nonsensation, poor capillary filling      Lab Results  Component Value Date   WBC 7.4 05/01/2021   HGB 10.9 (L) 05/01/2021   HCT 35.1 (L) 05/01/2021   PLT 133 (L) 05/01/2021   GLUCOSE 110 (H) 05/04/2021   CHOL 127 12/29/2020   TRIG 125 12/29/2020   HDL 31 (L) 12/29/2020   LDLCALC 73 12/29/2020   ALT 16 05/04/2021   AST 19 05/04/2021   NA 141 05/04/2021   K 3.5 05/04/2021   CL 94 (L)  05/04/2021   CREATININE 3.41 (H) 05/04/2021   BUN 54 (H) 05/04/2021   CO2 29 05/04/2021   INR 3.7 (H) 05/02/2021   HGBA1C 6.1 (H) 12/29/2020      Assessment & Plan:   Problem List Items Addressed This Visit       Cardiovascular and Mediastinum   Diabetes mellitus type 2 with peripheral artery disease (HCC) - Primary   Relevant Medications   aspirin 81 MG EC tablet   Other Relevant Orders   Hemoglobin A1c An individual care plan for diabetes was established and reinforced today.  The patient's status was assessed using clinical findings on exam, labs and diagnostic testing. Patient success at meeting goals based on disease specific evidence-based guidelines and found to be good controlled. Medications were assessed and patient's understanding of the medical issues , including barriers were assessed. Recommend adherence to a diabetic diet, a graduated exercise program, HgbA1c level is checked quarterly, and urine microalbumin performed yearly .  Annual mono-filament sensation testing performed. Lower blood pressure and control hyperlipidemia is important. Get annual eye exams and annual flu shots and smoking cessation discussed.  Self management goals were discussed.    Essential hypertension   Relevant Medications   aspirin 81 MG EC tablet   Other Relevant Orders   CBC with Differential   Comprehensive metabolic panel An individual hypertension care plan was established and reinforced today.  The patient's status was assessed using clinical findings on exam and labs or diagnostic tests. The patient's success at meeting treatment goals on disease specific evidence-based guidelines and found to  be well controlled. SELF MANAGEMENT: The patient and I together assessed ways to personally work towards obtaining the recommended goals. RECOMMENDATIONS: avoid decongestants found in common cold remedies, decrease consumption of alcohol, perform routine monitoring of BP with home BP cuff,  exercise, reduction of dietary salt, take medicines as prescribed, try not to miss doses and quit smoking.  Regular exercise and maintaining a healthy weight is needed.  Stress reduction may help. A CLINICAL SUMMARY including written plan identify barriers to care unique to individual due to social or financial issues.  We attempt to mutually creat solutions for individual and family understanding.     Persistent atrial fibrillation (HCC)   Relevant Medications   aspirin 81 MG EC tablet Patient has a diagnosis of Chronic atrial fibrillation.   Patient is on warfarin and has controlled ventricular response.  Patient is CV stable.     CAD in native artery   Relevant Medications   aspirin 81 MG EC tablet An individual plan was formulated based on patient history and exam, labs and evidence based data. Patient has not had recent angina or nitroglycerin use. continue present treatment.     Acute embolism and thrombosis of unspecified deep veins of unspecified lower extremity (HCC)   Relevant Medications   aspirin 81 MG EC tablet Patient continues on warfarin    HFrEF (heart failure with reduced ejection fraction) (HCC)   Relevant Medications   aspirin 81 MG EC tablet An individualized care plan was established and reinforced.  The patient's disease status was assessed using clinical finding son exam today, labs, and/or other diagnostic testing such as x-rays, to determine the patient's success in meeting treatmentgoalsbased on disease-based guidelines and found to beimproving. But not at goal yet. Medications prescriptions no changes Laboratory tests ordered to be performed today include routine. RECOMMENDATIONS: given include see cardiology.  Call physician is patient gains 3 lbs in one day or 5 lbs for one week.  Call for progressive PND, orthopnea or increased pedal edema.     Cardiorenal syndrome, stage 1-4 or unspecified chronic kidney disease, with heart failure (HCC)   Relevant  Medications   aspirin 81 MG EC tablet Kidney tests are stable     Hematopoietic and Hemostatic   Acquired thrombophilia ([place]) Patient continues on Warfarin      Other   Mixed hyperlipidemia   Relevant Medications   aspirin 81 MG EC tablet   Other Relevant Orders   Lipid panel AN INDIVIDUAL CARE PLAN for hyperlipidemia/ cholesterol was established and reinforced today.  The patient's status was assessed using clinical findings on exam, lab and other diagnostic tests. The patient's disease status was assessed based on evidence-based guidelines and found to be fair controlled. MEDICATIONS were reviewed. SELF MANAGEMENT GOALS have been discussed and patient's success at attaining the goal of low cholesterol was assessed. RECOMMENDATION given include regular exercise 3 days a week and low cholesterol/low fat diet. CLINICAL SUMMARY including written plan to identify barriers unique to the patient due to social or economic  reasons was discussed.     BMI 38.0-38.9,adult An individualize plan was formulated for obesity using patient history and physical exam to encourage weight loss.  An evidence based program was formulated.  Patient is to cut portion size with meals and to plan physical exercise 3 days a week at least 20 minutes.  Weight watchers and other programs are helpful.  Planned amount of weight loss 10 lbs. Patient has diabetes and hypertension so meets criteria for morbid  obesity    Morbid obesity ([place]) An individualize plan was formulated for obesity using patient history and physical exam to encourage weight loss.  An evidence based program was formulated.  Patient is to cut portion size with meals and to plan physical exercise 3 days a week at least 20 minutes.  Weight watchers and other programs are helpful.  Planned amount of weight loss 10 lbs.     Mild protein-calorie malnutrition ([place]) Patient has been losing muscle weight   Other Visit Diagnoses     Primary  osteoarthritis, unspecified site       Relevant Medications   aspirin 81 MG EC tablet   acetaminophen-codeine (TYLENOL #3) 300-30 MG tablet Patient has chronic OA renew tylenol #3  OSA Using CPAP consistently every night and medically benefiting from its use.      .  Meds ordered this encounter  Medications   zolpidem (AMBIEN) 5 MG tablet    Sig: Take 1 tablet (5 mg total) by mouth at bedtime as needed for sleep.    Dispense:  15 tablet    Refill:  3   acetaminophen-codeine (TYLENOL #3) 300-30 MG tablet    Sig: Take 2 tablets by mouth every 4 (four) hours as needed for moderate pain.    Dispense:  60 tablet    Refill:  3    Orders Placed This Encounter  Procedures   CBC with Differential   Comprehensive metabolic panel   Lipid panel   Hemoglobin A1c   35 minute visit with review of systems  Follow-up: Return in about 4 months (around 12/09/2021) for rovider or choice.  An After Visit Summary was printed and given to the patient.  Reinaldo Meeker, MD [facility] [phone]

## 2021-08-10 LAB — CBC WITH DIFFERENTIAL/PLATELET
Basophils Absolute: 0 10*3/uL (ref 0.0–0.2)
Basos: 1 %
EOS (ABSOLUTE): 0.1 10*3/uL (ref 0.0–0.4)
Eos: 1 %
Hematocrit: 31.6 % — ABNORMAL LOW (ref 37.5–51.0)
Hemoglobin: 9.6 g/dL — ABNORMAL LOW (ref 13.0–17.7)
Immature Grans (Abs): 0.1 10*3/uL (ref 0.0–0.1)
Immature Granulocytes: 1 %
Lymphocytes Absolute: 1.1 10*3/uL (ref 0.7–3.1)
Lymphs: 19 %
MCH: 25.3 pg — ABNORMAL LOW (ref 26.6–33.0)
MCHC: 30.4 g/dL — ABNORMAL LOW (ref 31.5–35.7)
MCV: 83 fL (ref 79–97)
Monocytes Absolute: 0.4 10*3/uL (ref 0.1–0.9)
Monocytes: 8 %
Neutrophils Absolute: 4.1 10*3/uL (ref 1.4–7.0)
Neutrophils: 70 %
Platelets: 144 10*3/uL — ABNORMAL LOW (ref 150–450)
RBC: 3.79 x10E6/uL — ABNORMAL LOW (ref 4.14–5.80)
RDW: 16.9 % — ABNORMAL HIGH (ref 11.6–15.4)
WBC: 5.7 10*3/uL (ref 3.4–10.8)

## 2021-08-10 LAB — COMPREHENSIVE METABOLIC PANEL
ALT: 16 IU/L (ref 0–44)
AST: 18 IU/L (ref 0–40)
Albumin/Globulin Ratio: 1.3 (ref 1.2–2.2)
Albumin: 3.5 g/dL — ABNORMAL LOW (ref 3.7–4.7)
Alkaline Phosphatase: 97 IU/L (ref 44–121)
BUN/Creatinine Ratio: 19 (ref 10–24)
BUN: 57 mg/dL — ABNORMAL HIGH (ref 8–27)
Bilirubin Total: 0.7 mg/dL (ref 0.0–1.2)
CO2: 24 mmol/L (ref 20–29)
Calcium: 8.2 mg/dL — ABNORMAL LOW (ref 8.6–10.2)
Chloride: 103 mmol/L (ref 96–106)
Creatinine, Ser: 3.06 mg/dL — ABNORMAL HIGH (ref 0.76–1.27)
Globulin, Total: 2.6 g/dL (ref 1.5–4.5)
Glucose: 106 mg/dL — ABNORMAL HIGH (ref 70–99)
Potassium: 4 mmol/L (ref 3.5–5.2)
Sodium: 142 mmol/L (ref 134–144)
Total Protein: 6.1 g/dL (ref 6.0–8.5)
eGFR: 20 mL/min/{1.73_m2} — ABNORMAL LOW (ref >59)

## 2021-08-10 LAB — LIPID PANEL
Chol/HDL Ratio: 2.9 ratio (ref 0.0–5.0)
Cholesterol, Total: 78 mg/dL — ABNORMAL LOW (ref 100–199)
HDL: 27 mg/dL — ABNORMAL LOW (ref >39)
LDL Chol Calc (NIH): 35 mg/dL (ref 0–99)
Triglycerides: 72 mg/dL (ref 0–149)
VLDL Cholesterol Cal: 16 mg/dL (ref 5–40)

## 2021-08-10 LAB — HEMOGLOBIN A1C
Est. average glucose Bld gHb Est-mCnc: 131 mg/dL
Hgb A1c MFr Bld: 6.2 % — ABNORMAL HIGH (ref 4.8–5.6)

## 2021-08-10 LAB — CARDIOVASCULAR RISK ASSESSMENT

## 2021-08-10 NOTE — Progress Notes (Signed)
Hemoglobin lower to 9.6 from 10.9, platelets low, need serial hemoccults, glucose 106, kidneys remain stage 4, needs hemopoietin level, liver tests normal, Cholesterol normal, A1c 6.2,  ?lp

## 2021-08-11 DIAGNOSIS — Z87891 Personal history of nicotine dependence: Secondary | ICD-10-CM | POA: Diagnosis not present

## 2021-08-11 DIAGNOSIS — E785 Hyperlipidemia, unspecified: Secondary | ICD-10-CM | POA: Diagnosis not present

## 2021-08-11 DIAGNOSIS — G4733 Obstructive sleep apnea (adult) (pediatric): Secondary | ICD-10-CM | POA: Diagnosis not present

## 2021-08-11 DIAGNOSIS — E119 Type 2 diabetes mellitus without complications: Secondary | ICD-10-CM | POA: Diagnosis not present

## 2021-08-11 DIAGNOSIS — Z955 Presence of coronary angioplasty implant and graft: Secondary | ICD-10-CM | POA: Diagnosis not present

## 2021-08-11 DIAGNOSIS — Z9989 Dependence on other enabling machines and devices: Secondary | ICD-10-CM | POA: Diagnosis not present

## 2021-08-11 DIAGNOSIS — I340 Nonrheumatic mitral (valve) insufficiency: Secondary | ICD-10-CM | POA: Diagnosis not present

## 2021-08-11 DIAGNOSIS — Z8616 Personal history of COVID-19: Secondary | ICD-10-CM | POA: Diagnosis not present

## 2021-08-11 DIAGNOSIS — R5381 Other malaise: Secondary | ICD-10-CM | POA: Diagnosis not present

## 2021-08-11 DIAGNOSIS — I2510 Atherosclerotic heart disease of native coronary artery without angina pectoris: Secondary | ICD-10-CM | POA: Diagnosis not present

## 2021-08-11 DIAGNOSIS — I480 Paroxysmal atrial fibrillation: Secondary | ICD-10-CM | POA: Diagnosis not present

## 2021-08-11 DIAGNOSIS — I5022 Chronic systolic (congestive) heart failure: Secondary | ICD-10-CM | POA: Diagnosis not present

## 2021-08-11 DIAGNOSIS — M170 Bilateral primary osteoarthritis of knee: Secondary | ICD-10-CM | POA: Diagnosis not present

## 2021-08-11 DIAGNOSIS — Z950 Presence of cardiac pacemaker: Secondary | ICD-10-CM | POA: Diagnosis not present

## 2021-08-11 DIAGNOSIS — I110 Hypertensive heart disease with heart failure: Secondary | ICD-10-CM | POA: Diagnosis not present

## 2021-08-11 DIAGNOSIS — Z7901 Long term (current) use of anticoagulants: Secondary | ICD-10-CM | POA: Diagnosis not present

## 2021-08-12 LAB — ERYTHROPOIETIN: Erythropoietin: 61.1 m[IU]/mL — ABNORMAL HIGH (ref 2.6–18.5)

## 2021-08-12 LAB — SPECIMEN STATUS REPORT

## 2021-08-12 NOTE — Progress Notes (Signed)
Erythropoietin high, means probably blood loss, the body is trying to increase RBCs ?lp

## 2021-08-12 NOTE — Progress Notes (Signed)
Erythropoietin high, means he is losing blood , needs 3 hemoacults ?lp

## 2021-08-15 DIAGNOSIS — I110 Hypertensive heart disease with heart failure: Secondary | ICD-10-CM | POA: Diagnosis not present

## 2021-08-15 DIAGNOSIS — E785 Hyperlipidemia, unspecified: Secondary | ICD-10-CM | POA: Diagnosis not present

## 2021-08-15 DIAGNOSIS — E119 Type 2 diabetes mellitus without complications: Secondary | ICD-10-CM | POA: Diagnosis not present

## 2021-08-15 DIAGNOSIS — R5381 Other malaise: Secondary | ICD-10-CM | POA: Diagnosis not present

## 2021-08-15 DIAGNOSIS — Z9989 Dependence on other enabling machines and devices: Secondary | ICD-10-CM | POA: Diagnosis not present

## 2021-08-15 DIAGNOSIS — Z950 Presence of cardiac pacemaker: Secondary | ICD-10-CM | POA: Diagnosis not present

## 2021-08-15 DIAGNOSIS — Z955 Presence of coronary angioplasty implant and graft: Secondary | ICD-10-CM | POA: Diagnosis not present

## 2021-08-15 DIAGNOSIS — Z7901 Long term (current) use of anticoagulants: Secondary | ICD-10-CM | POA: Diagnosis not present

## 2021-08-15 DIAGNOSIS — I340 Nonrheumatic mitral (valve) insufficiency: Secondary | ICD-10-CM | POA: Diagnosis not present

## 2021-08-15 DIAGNOSIS — Z8616 Personal history of COVID-19: Secondary | ICD-10-CM | POA: Diagnosis not present

## 2021-08-15 DIAGNOSIS — I2510 Atherosclerotic heart disease of native coronary artery without angina pectoris: Secondary | ICD-10-CM | POA: Diagnosis not present

## 2021-08-15 DIAGNOSIS — G4733 Obstructive sleep apnea (adult) (pediatric): Secondary | ICD-10-CM | POA: Diagnosis not present

## 2021-08-15 DIAGNOSIS — I480 Paroxysmal atrial fibrillation: Secondary | ICD-10-CM | POA: Diagnosis not present

## 2021-08-15 DIAGNOSIS — Z87891 Personal history of nicotine dependence: Secondary | ICD-10-CM | POA: Diagnosis not present

## 2021-08-15 DIAGNOSIS — I5022 Chronic systolic (congestive) heart failure: Secondary | ICD-10-CM | POA: Diagnosis not present

## 2021-08-15 DIAGNOSIS — M170 Bilateral primary osteoarthritis of knee: Secondary | ICD-10-CM | POA: Diagnosis not present

## 2021-08-17 DIAGNOSIS — E785 Hyperlipidemia, unspecified: Secondary | ICD-10-CM | POA: Diagnosis not present

## 2021-08-17 DIAGNOSIS — I2510 Atherosclerotic heart disease of native coronary artery without angina pectoris: Secondary | ICD-10-CM | POA: Diagnosis not present

## 2021-08-17 DIAGNOSIS — I480 Paroxysmal atrial fibrillation: Secondary | ICD-10-CM | POA: Diagnosis not present

## 2021-08-17 DIAGNOSIS — Z87891 Personal history of nicotine dependence: Secondary | ICD-10-CM | POA: Diagnosis not present

## 2021-08-17 DIAGNOSIS — Z955 Presence of coronary angioplasty implant and graft: Secondary | ICD-10-CM | POA: Diagnosis not present

## 2021-08-17 DIAGNOSIS — Z7901 Long term (current) use of anticoagulants: Secondary | ICD-10-CM | POA: Diagnosis not present

## 2021-08-17 DIAGNOSIS — I340 Nonrheumatic mitral (valve) insufficiency: Secondary | ICD-10-CM | POA: Diagnosis not present

## 2021-08-17 DIAGNOSIS — Z8616 Personal history of COVID-19: Secondary | ICD-10-CM | POA: Diagnosis not present

## 2021-08-17 DIAGNOSIS — R5381 Other malaise: Secondary | ICD-10-CM | POA: Diagnosis not present

## 2021-08-17 DIAGNOSIS — G4733 Obstructive sleep apnea (adult) (pediatric): Secondary | ICD-10-CM | POA: Diagnosis not present

## 2021-08-17 DIAGNOSIS — M170 Bilateral primary osteoarthritis of knee: Secondary | ICD-10-CM | POA: Diagnosis not present

## 2021-08-17 DIAGNOSIS — I5022 Chronic systolic (congestive) heart failure: Secondary | ICD-10-CM | POA: Diagnosis not present

## 2021-08-17 DIAGNOSIS — Z9989 Dependence on other enabling machines and devices: Secondary | ICD-10-CM | POA: Diagnosis not present

## 2021-08-17 DIAGNOSIS — Z950 Presence of cardiac pacemaker: Secondary | ICD-10-CM | POA: Diagnosis not present

## 2021-08-17 DIAGNOSIS — E119 Type 2 diabetes mellitus without complications: Secondary | ICD-10-CM | POA: Diagnosis not present

## 2021-08-17 DIAGNOSIS — I110 Hypertensive heart disease with heart failure: Secondary | ICD-10-CM | POA: Diagnosis not present

## 2021-08-18 DIAGNOSIS — E876 Hypokalemia: Secondary | ICD-10-CM | POA: Diagnosis not present

## 2021-08-18 DIAGNOSIS — I4819 Other persistent atrial fibrillation: Secondary | ICD-10-CM | POA: Diagnosis not present

## 2021-08-24 DIAGNOSIS — G4733 Obstructive sleep apnea (adult) (pediatric): Secondary | ICD-10-CM | POA: Diagnosis not present

## 2021-08-31 DIAGNOSIS — I110 Hypertensive heart disease with heart failure: Secondary | ICD-10-CM | POA: Diagnosis not present

## 2021-08-31 DIAGNOSIS — Z9989 Dependence on other enabling machines and devices: Secondary | ICD-10-CM | POA: Diagnosis not present

## 2021-08-31 DIAGNOSIS — I5022 Chronic systolic (congestive) heart failure: Secondary | ICD-10-CM | POA: Diagnosis not present

## 2021-08-31 DIAGNOSIS — M170 Bilateral primary osteoarthritis of knee: Secondary | ICD-10-CM | POA: Diagnosis not present

## 2021-08-31 DIAGNOSIS — Z8616 Personal history of COVID-19: Secondary | ICD-10-CM | POA: Diagnosis not present

## 2021-08-31 DIAGNOSIS — I480 Paroxysmal atrial fibrillation: Secondary | ICD-10-CM | POA: Diagnosis not present

## 2021-08-31 DIAGNOSIS — I340 Nonrheumatic mitral (valve) insufficiency: Secondary | ICD-10-CM | POA: Diagnosis not present

## 2021-08-31 DIAGNOSIS — E119 Type 2 diabetes mellitus without complications: Secondary | ICD-10-CM | POA: Diagnosis not present

## 2021-08-31 DIAGNOSIS — Z7901 Long term (current) use of anticoagulants: Secondary | ICD-10-CM | POA: Diagnosis not present

## 2021-08-31 DIAGNOSIS — Z87891 Personal history of nicotine dependence: Secondary | ICD-10-CM | POA: Diagnosis not present

## 2021-08-31 DIAGNOSIS — E785 Hyperlipidemia, unspecified: Secondary | ICD-10-CM | POA: Diagnosis not present

## 2021-08-31 DIAGNOSIS — Z955 Presence of coronary angioplasty implant and graft: Secondary | ICD-10-CM | POA: Diagnosis not present

## 2021-08-31 DIAGNOSIS — G4733 Obstructive sleep apnea (adult) (pediatric): Secondary | ICD-10-CM | POA: Diagnosis not present

## 2021-08-31 DIAGNOSIS — Z950 Presence of cardiac pacemaker: Secondary | ICD-10-CM | POA: Diagnosis not present

## 2021-08-31 DIAGNOSIS — I2510 Atherosclerotic heart disease of native coronary artery without angina pectoris: Secondary | ICD-10-CM | POA: Diagnosis not present

## 2021-08-31 DIAGNOSIS — R5381 Other malaise: Secondary | ICD-10-CM | POA: Diagnosis not present

## 2021-09-01 DIAGNOSIS — E876 Hypokalemia: Secondary | ICD-10-CM | POA: Diagnosis not present

## 2021-09-01 DIAGNOSIS — I10 Essential (primary) hypertension: Secondary | ICD-10-CM | POA: Diagnosis not present

## 2021-09-01 DIAGNOSIS — I4819 Other persistent atrial fibrillation: Secondary | ICD-10-CM | POA: Diagnosis not present

## 2021-09-03 DIAGNOSIS — J9611 Chronic respiratory failure with hypoxia: Secondary | ICD-10-CM | POA: Diagnosis not present

## 2021-09-04 DIAGNOSIS — J9611 Chronic respiratory failure with hypoxia: Secondary | ICD-10-CM | POA: Diagnosis not present

## 2021-09-07 DIAGNOSIS — Z95818 Presence of other cardiac implants and grafts: Secondary | ICD-10-CM | POA: Diagnosis not present

## 2021-09-07 DIAGNOSIS — I428 Other cardiomyopathies: Secondary | ICD-10-CM | POA: Diagnosis not present

## 2021-09-07 DIAGNOSIS — Z950 Presence of cardiac pacemaker: Secondary | ICD-10-CM | POA: Diagnosis not present

## 2021-09-07 DIAGNOSIS — I2510 Atherosclerotic heart disease of native coronary artery without angina pectoris: Secondary | ICD-10-CM | POA: Diagnosis not present

## 2021-09-07 DIAGNOSIS — G4733 Obstructive sleep apnea (adult) (pediatric): Secondary | ICD-10-CM | POA: Diagnosis not present

## 2021-09-07 DIAGNOSIS — I340 Nonrheumatic mitral (valve) insufficiency: Secondary | ICD-10-CM | POA: Diagnosis not present

## 2021-09-07 DIAGNOSIS — I5023 Acute on chronic systolic (congestive) heart failure: Secondary | ICD-10-CM | POA: Diagnosis not present

## 2021-09-07 DIAGNOSIS — R7303 Prediabetes: Secondary | ICD-10-CM | POA: Diagnosis not present

## 2021-09-07 DIAGNOSIS — E876 Hypokalemia: Secondary | ICD-10-CM | POA: Diagnosis not present

## 2021-09-07 DIAGNOSIS — R140 Abdominal distension (gaseous): Secondary | ICD-10-CM | POA: Diagnosis not present

## 2021-09-07 DIAGNOSIS — Z7901 Long term (current) use of anticoagulants: Secondary | ICD-10-CM | POA: Diagnosis not present

## 2021-09-07 DIAGNOSIS — Z8249 Family history of ischemic heart disease and other diseases of the circulatory system: Secondary | ICD-10-CM | POA: Diagnosis not present

## 2021-09-07 DIAGNOSIS — E1122 Type 2 diabetes mellitus with diabetic chronic kidney disease: Secondary | ICD-10-CM | POA: Diagnosis not present

## 2021-09-07 DIAGNOSIS — R609 Edema, unspecified: Secondary | ICD-10-CM | POA: Diagnosis not present

## 2021-09-07 DIAGNOSIS — I509 Heart failure, unspecified: Secondary | ICD-10-CM | POA: Diagnosis not present

## 2021-09-07 DIAGNOSIS — H6123 Impacted cerumen, bilateral: Secondary | ICD-10-CM | POA: Diagnosis not present

## 2021-09-07 DIAGNOSIS — D696 Thrombocytopenia, unspecified: Secondary | ICD-10-CM | POA: Diagnosis not present

## 2021-09-07 DIAGNOSIS — I2582 Chronic total occlusion of coronary artery: Secondary | ICD-10-CM | POA: Diagnosis not present

## 2021-09-07 DIAGNOSIS — I255 Ischemic cardiomyopathy: Secondary | ICD-10-CM | POA: Diagnosis not present

## 2021-09-07 DIAGNOSIS — I495 Sick sinus syndrome: Secondary | ICD-10-CM | POA: Diagnosis not present

## 2021-09-07 DIAGNOSIS — I051 Rheumatic mitral insufficiency: Secondary | ICD-10-CM | POA: Diagnosis not present

## 2021-09-07 DIAGNOSIS — E785 Hyperlipidemia, unspecified: Secondary | ICD-10-CM | POA: Diagnosis not present

## 2021-09-07 DIAGNOSIS — I959 Hypotension, unspecified: Secondary | ICD-10-CM | POA: Diagnosis not present

## 2021-09-07 DIAGNOSIS — I130 Hypertensive heart and chronic kidney disease with heart failure and stage 1 through stage 4 chronic kidney disease, or unspecified chronic kidney disease: Secondary | ICD-10-CM | POA: Diagnosis not present

## 2021-09-07 DIAGNOSIS — D509 Iron deficiency anemia, unspecified: Secondary | ICD-10-CM | POA: Diagnosis not present

## 2021-09-07 DIAGNOSIS — N189 Chronic kidney disease, unspecified: Secondary | ICD-10-CM | POA: Diagnosis not present

## 2021-09-07 DIAGNOSIS — I480 Paroxysmal atrial fibrillation: Secondary | ICD-10-CM | POA: Diagnosis not present

## 2021-09-07 DIAGNOSIS — R791 Abnormal coagulation profile: Secondary | ICD-10-CM | POA: Diagnosis not present

## 2021-09-07 DIAGNOSIS — I248 Other forms of acute ischemic heart disease: Secondary | ICD-10-CM | POA: Diagnosis not present

## 2021-09-07 DIAGNOSIS — R0602 Shortness of breath: Secondary | ICD-10-CM | POA: Diagnosis not present

## 2021-09-07 DIAGNOSIS — J90 Pleural effusion, not elsewhere classified: Secondary | ICD-10-CM | POA: Diagnosis not present

## 2021-09-07 DIAGNOSIS — I10 Essential (primary) hypertension: Secondary | ICD-10-CM | POA: Diagnosis not present

## 2021-09-07 DIAGNOSIS — I4821 Permanent atrial fibrillation: Secondary | ICD-10-CM | POA: Diagnosis not present

## 2021-09-07 DIAGNOSIS — I4819 Other persistent atrial fibrillation: Secondary | ICD-10-CM | POA: Diagnosis not present

## 2021-09-07 DIAGNOSIS — Z955 Presence of coronary angioplasty implant and graft: Secondary | ICD-10-CM | POA: Diagnosis not present

## 2021-09-07 DIAGNOSIS — E8809 Other disorders of plasma-protein metabolism, not elsewhere classified: Secondary | ICD-10-CM | POA: Diagnosis not present

## 2021-09-07 DIAGNOSIS — Z743 Need for continuous supervision: Secondary | ICD-10-CM | POA: Diagnosis not present

## 2021-09-07 DIAGNOSIS — N184 Chronic kidney disease, stage 4 (severe): Secondary | ICD-10-CM | POA: Diagnosis not present

## 2021-09-07 DIAGNOSIS — Z87891 Personal history of nicotine dependence: Secondary | ICD-10-CM | POA: Diagnosis not present

## 2021-09-07 DIAGNOSIS — D649 Anemia, unspecified: Secondary | ICD-10-CM | POA: Diagnosis not present

## 2021-09-15 DIAGNOSIS — I480 Paroxysmal atrial fibrillation: Secondary | ICD-10-CM | POA: Diagnosis not present

## 2021-09-15 DIAGNOSIS — I5022 Chronic systolic (congestive) heart failure: Secondary | ICD-10-CM | POA: Diagnosis not present

## 2021-09-15 DIAGNOSIS — I2510 Atherosclerotic heart disease of native coronary artery without angina pectoris: Secondary | ICD-10-CM | POA: Diagnosis not present

## 2021-09-15 DIAGNOSIS — E119 Type 2 diabetes mellitus without complications: Secondary | ICD-10-CM | POA: Diagnosis not present

## 2021-09-15 DIAGNOSIS — Z87891 Personal history of nicotine dependence: Secondary | ICD-10-CM | POA: Diagnosis not present

## 2021-09-15 DIAGNOSIS — I340 Nonrheumatic mitral (valve) insufficiency: Secondary | ICD-10-CM | POA: Diagnosis not present

## 2021-09-15 DIAGNOSIS — Z8616 Personal history of COVID-19: Secondary | ICD-10-CM | POA: Diagnosis not present

## 2021-09-15 DIAGNOSIS — G4733 Obstructive sleep apnea (adult) (pediatric): Secondary | ICD-10-CM | POA: Diagnosis not present

## 2021-09-15 DIAGNOSIS — Z7901 Long term (current) use of anticoagulants: Secondary | ICD-10-CM | POA: Diagnosis not present

## 2021-09-15 DIAGNOSIS — M170 Bilateral primary osteoarthritis of knee: Secondary | ICD-10-CM | POA: Diagnosis not present

## 2021-09-15 DIAGNOSIS — I110 Hypertensive heart disease with heart failure: Secondary | ICD-10-CM | POA: Diagnosis not present

## 2021-09-15 DIAGNOSIS — R5381 Other malaise: Secondary | ICD-10-CM | POA: Diagnosis not present

## 2021-09-15 DIAGNOSIS — Z950 Presence of cardiac pacemaker: Secondary | ICD-10-CM | POA: Diagnosis not present

## 2021-09-15 DIAGNOSIS — E785 Hyperlipidemia, unspecified: Secondary | ICD-10-CM | POA: Diagnosis not present

## 2021-09-15 DIAGNOSIS — Z9989 Dependence on other enabling machines and devices: Secondary | ICD-10-CM | POA: Diagnosis not present

## 2021-09-15 DIAGNOSIS — Z955 Presence of coronary angioplasty implant and graft: Secondary | ICD-10-CM | POA: Diagnosis not present

## 2021-09-16 DIAGNOSIS — E878 Other disorders of electrolyte and fluid balance, not elsewhere classified: Secondary | ICD-10-CM | POA: Diagnosis not present

## 2021-09-16 DIAGNOSIS — R7989 Other specified abnormal findings of blood chemistry: Secondary | ICD-10-CM | POA: Diagnosis not present

## 2021-09-16 DIAGNOSIS — I5022 Chronic systolic (congestive) heart failure: Secondary | ICD-10-CM | POA: Diagnosis not present

## 2021-09-18 DIAGNOSIS — I4821 Permanent atrial fibrillation: Secondary | ICD-10-CM | POA: Diagnosis not present

## 2021-09-18 DIAGNOSIS — I5023 Acute on chronic systolic (congestive) heart failure: Secondary | ICD-10-CM | POA: Diagnosis not present

## 2021-09-18 DIAGNOSIS — N184 Chronic kidney disease, stage 4 (severe): Secondary | ICD-10-CM | POA: Diagnosis not present

## 2021-09-18 DIAGNOSIS — I255 Ischemic cardiomyopathy: Secondary | ICD-10-CM | POA: Diagnosis not present

## 2021-09-18 DIAGNOSIS — Z7901 Long term (current) use of anticoagulants: Secondary | ICD-10-CM | POA: Diagnosis not present

## 2021-09-18 DIAGNOSIS — I2510 Atherosclerotic heart disease of native coronary artery without angina pectoris: Secondary | ICD-10-CM | POA: Diagnosis not present

## 2021-09-20 ENCOUNTER — Encounter: Payer: Self-pay | Admitting: Legal Medicine

## 2021-09-20 ENCOUNTER — Ambulatory Visit (INDEPENDENT_AMBULATORY_CARE_PROVIDER_SITE_OTHER): Payer: Medicare Other | Admitting: Legal Medicine

## 2021-09-20 VITALS — BP 100/58 | HR 62 | Temp 98.7°F | Resp 16 | Ht 73.0 in | Wt 277.0 lb

## 2021-09-20 DIAGNOSIS — J9601 Acute respiratory failure with hypoxia: Secondary | ICD-10-CM | POA: Diagnosis not present

## 2021-09-20 DIAGNOSIS — I5023 Acute on chronic systolic (congestive) heart failure: Secondary | ICD-10-CM | POA: Diagnosis not present

## 2021-09-20 DIAGNOSIS — R0602 Shortness of breath: Secondary | ICD-10-CM | POA: Diagnosis not present

## 2021-09-20 DIAGNOSIS — I130 Hypertensive heart and chronic kidney disease with heart failure and stage 1 through stage 4 chronic kidney disease, or unspecified chronic kidney disease: Secondary | ICD-10-CM

## 2021-09-20 DIAGNOSIS — N2581 Secondary hyperparathyroidism of renal origin: Secondary | ICD-10-CM

## 2021-09-20 DIAGNOSIS — I5022 Chronic systolic (congestive) heart failure: Secondary | ICD-10-CM

## 2021-09-20 DIAGNOSIS — N185 Chronic kidney disease, stage 5: Secondary | ICD-10-CM | POA: Diagnosis not present

## 2021-09-20 DIAGNOSIS — I209 Angina pectoris, unspecified: Secondary | ICD-10-CM | POA: Diagnosis not present

## 2021-09-20 DIAGNOSIS — D68311 Acquired hemophilia: Secondary | ICD-10-CM

## 2021-09-20 MED ORDER — SPIRONOLACTONE 25 MG PO TABS: 25.0000 mg | ORAL_TABLET | Freq: Every day | ORAL | 3 refills | Status: DC

## 2021-09-20 NOTE — Assessment & Plan Note (Signed)
EF 20-25% ?

## 2021-09-20 NOTE — Progress Notes (Signed)
? ?Subjective:  ?Patient ID: Steven Hester, male    DOB: January 20, 1946  Age: 76 y.o. MRN: [id number] ? ?Chief Complaint  ?Patient presents with  ? Cough  ? Transitions Of Care  ? Congestive Heart Failure  ? ? ?HPITransition of care and reconciliation of medicines ?Patient was admited date: 09/07/2021; and discharge date: 09/14/2021 from [facility] ?Patient presented with PMH of CAD s/p stents, ischemic cardiomyopathy ,s/p pacemaker, systolic CHF, mitral regurgitation s/p mitral clip, A-fib on Coumadin, HTN, CKD stage IV. He has mitral regurgitation, had mitral clip placement on 06/30/2021. Patient states that he has been having worsening leg swelling for the last 3 to 4 weeks. He also has orthopnea, denies PND. He has been compliant with the diuretics and fluid restrictions. He went to see his cardiologist, who referred him to the ER due to concern for volume overload. ? ?BNP elevated 924. CXR revealed cardiomegaly with vascular congestion and mild interstitial edema. He also has persistent left pleural effusion. He was treated with Lasix IV.  ?  ?Echo on 07/25/2021 revealed EF of 25-30%, severe global hypokinesis, mild to moderate MR. ? ?He also mentioned productive cough with yellow-brownish sputum and chest congestion since 2 weeks ago. He denied fever,chills.  ? ?Current Outpatient Medications on File Prior to Visit  ?Medication Sig Dispense Refill  ? ACCU-CHEK GUIDE test strip USE 1  TWICE DAILY 100 each 0  ? acetaminophen-codeine (TYLENOL #3) 300-30 MG tablet Take 2 tablets by mouth every 4 (four) hours as needed for moderate pain. 60 tablet 3  ? amiodarone (PACERONE) 200 MG tablet Take 200 mg by mouth daily.    ? aspirin 81 MG EC tablet Take 1 tablet by mouth daily.    ? carvedilol (COREG) 3.125 MG tablet Take 3.125 mg by mouth 2 (two) times daily.    ? furosemide (LASIX) 40 MG tablet Take 1.5 tablets (60 mg total) by mouth 2 (two) times daily. 30 tablet 0  ? nitroGLYCERIN (NITROSTAT) 0.4 MG SL  tablet Place under the tongue.    ? potassium chloride SA (KLOR-CON M) 20 MEQ tablet Take 1 tablet by mouth daily.    ? Sennosides (LAXATIVE) 25 MG TABS Take 25 mg by mouth daily.    ? warfarin (COUMADIN) 2.5 MG tablet Take 2.5 mg by mouth See admin instructions. Take 2.'5mg'$  by mouth once daily on Wednesday and Friday    ? warfarin (COUMADIN) 5 MG tablet Take 5 mg by mouth See admin instructions. Take '5mg'$  by mouth once daily every Sunday, Monday, Tuesday, Thursday, Saturday    ? zolpidem (AMBIEN) 5 MG tablet Take 1 tablet (5 mg total) by mouth at bedtime as needed for sleep. 15 tablet 3  ? isosorbide mononitrate (IMDUR) 30 MG 24 hr tablet Take 30 mg by mouth daily. (Patient not taking: Reported on 09/20/2021)    ? ?No current facility-administered medications on file prior to visit.  ? ?Past Medical History:  ?Diagnosis Date  ? CAD (coronary artery disease)   ? DES to RCA, circumflex, OM in [place] 2007  ? CHF (congestive heart failure) ([place])   ? CKD (chronic kidney disease) stage 4, GFR 15-29 ml/min (HCC)   ? Essential hypertension   ? Inferior myocardial infarction [facility])   ? Ischemic cardiomyopathy   ? Mitral regurgitation   ? Mixed hyperlipidemia   ? Pacemaker   ? Medtronic  ? Persistent atrial fibrillation ([place])   ? Sinus node dysfunction (HCC)   ? Type 2  diabetes mellitus ([place])   ? ?Past Surgical History:  ?Procedure Laterality Date  ? COLON SURGERY    ? CORONARY ANGIOPLASTY WITH STENT PLACEMENT    ? PACEMAKER INSERTION    ?  ?Family History  ?Problem Relation Age of Onset  ? Heart attack Mother   ? GI Disease Father   ? Cancer Father   ? ?Social History  ? ?Socioeconomic History  ? Marital status: Married  ?  Spouse name: Silva Bandy  ? Number of children: 1  ? Years of education: Not on file  ? Highest education level: Not on file  ?Occupational History  ? Not on file  ?Tobacco Use  ? Smoking status: Former  ?  Packs/day: 1.00  ?  Years: 10.00  ?  Pack years: 10.00  ?  Types: Cigarettes  ? Smokeless  tobacco: Never  ?Vaping Use  ? Vaping Use: Never used  ?Substance and Sexual Activity  ? Alcohol use: Yes  ?  Comment: occasionally  ? Drug use: Never  ? Sexual activity: Not Currently  ?  Partners: Female  ?Other Topics Concern  ? Not on file  ?Social History Narrative  ? Not on file  ? ?Social Determinants of Health  ? ?Financial Resource Strain: Not on file  ?Food Insecurity: No Food Insecurity  ? Worried About Charity fundraiser in the Last Year: Never true  ? Ran Out of Food in the Last Year: Never true  ?Transportation Needs: No Transportation Needs  ? Lack of Transportation (Medical): No  ? Lack of Transportation (Non-Medical): No  ?Physical Activity: Not on file  ?Stress: Not on file  ?Social Connections: Not on file  ? ? ?Review of Systems  ?Constitutional:  Positive for appetite change. Negative for chills, fatigue, fever and unexpected weight change.  ?HENT:  Negative for congestion, ear pain, sinus pain and sore throat.   ?Respiratory:  Positive for cough and shortness of breath.   ?Cardiovascular:  Positive for leg swelling. Negative for chest pain and palpitations.  ?Gastrointestinal:  Negative for abdominal pain, blood in stool, constipation, diarrhea, nausea and vomiting.  ?Endocrine: Negative for polydipsia.  ?Genitourinary:  Negative for dysuria.  ?Musculoskeletal:  Negative for back pain.  ?Skin:  Negative for rash.  ?Neurological:  Negative for headaches.  ? ? ?Objective:  ?BP (!) 100/58   Pulse 62   Temp 98.7 ?F (37.1 ?C)   Resp 16   Ht '6\' 1"'$  (1.854 m)   Wt 277 lb (125.6 kg)   SpO2 96%   BMI 36.55 kg/m?  ? ? ?  09/20/2021  ?  9:31 AM 08/09/2021  ?  7:28 AM 05/11/2021  ?  3:06 PM  ?BP/Weight  ?Systolic BP 892 119   ?Diastolic BP 58 62   ?Wt. (Lbs) 277 289.4 272  ?BMI 36.55 kg/m2 38.18 kg/m2 35.89 kg/m2  ? ? ?Physical Exam ?Vitals reviewed.  ?Constitutional:   ?   General: He is in acute distress.  ?   Appearance: Normal appearance. He is obese. He is ill-appearing.  ?HENT:  ?   Right Ear:  Tympanic membrane normal.  ?   Left Ear: Tympanic membrane normal.  ?   Mouth/Throat:  ?   Mouth: Mucous membranes are moist.  ?Eyes:  ?   Extraocular Movements: Extraocular movements intact.  ?   Conjunctiva/sclera: Conjunctivae normal.  ?   Pupils: Pupils are equal, round, and reactive to light.  ?Cardiovascular:  ?   Rate and Rhythm: Normal rate.  ?  Pulses: Normal pulses.  ?   Heart sounds: No murmur (obscured by respirations) heard. ?Pulmonary:  ?   Effort: Respiratory distress (dyspnea at rest) present.  ?   Breath sounds: No rales (at bases).  ?Abdominal:  ?   General: Abdomen is flat. Bowel sounds are normal. There is no distension.  ?   Tenderness: There is no abdominal tenderness.  ?Musculoskeletal:  ?   Cervical back: Normal range of motion.  ?   Right lower leg: Edema present.  ?   Left lower leg: Edema present.  ?Skin: ?   General: Skin is warm.  ?   Capillary Refill: Capillary refill takes less than 2 seconds.  ?Neurological:  ?   General: No focal deficit present.  ?   Mental Status: He is alert. Mental status is at baseline.  ?   Motor: No weakness.  ?   Gait: Gait normal.  ?   Comments: Wheel chair bound  ? ? ? ?  ? ?Lab Results  ?Component Value Date  ? WBC 5.7 08/09/2021  ? HGB 9.6 (L) 08/09/2021  ? HCT 31.6 (L) 08/09/2021  ? PLT 144 (L) 08/09/2021  ? GLUCOSE 106 (H) 08/09/2021  ? CHOL 78 (L) 08/09/2021  ? TRIG 72 08/09/2021  ? HDL 27 (L) 08/09/2021  ? [place] 35 08/09/2021  ? ALT 16 08/09/2021  ? AST 18 08/09/2021  ? NA 142 08/09/2021  ? K 4.0 08/09/2021  ? CL 103 08/09/2021  ? CREATININE 3.06 (H) 08/09/2021  ? BUN 57 (H) 08/09/2021  ? CO2 24 08/09/2021  ? INR 3.7 (H) 05/02/2021  ? HGBA1C 6.2 (H) 08/09/2021  ? ? ? ? ?Assessment & Plan:  ? ?Problem List Items Addressed This Visit   ? ?  ? Cardiovascular and Mediastinum  ? Chronic systolic congestive heart failure ([place])  ?  EF 20-25% ?Patient was admitted for acute exacerbation of his chronic congestive heart failure he has low ejection fraction  of 20 to 25% he also has chronic respiratory failure but is not using his oxygen I strongly recommend he uses it regularly to help with his shortness of breath as well as his ambulation.  I reviewed labs that his card

## 2021-09-21 DIAGNOSIS — E785 Hyperlipidemia, unspecified: Secondary | ICD-10-CM | POA: Diagnosis not present

## 2021-09-21 DIAGNOSIS — R5381 Other malaise: Secondary | ICD-10-CM | POA: Diagnosis not present

## 2021-09-21 DIAGNOSIS — G4733 Obstructive sleep apnea (adult) (pediatric): Secondary | ICD-10-CM | POA: Diagnosis not present

## 2021-09-21 DIAGNOSIS — E119 Type 2 diabetes mellitus without complications: Secondary | ICD-10-CM | POA: Diagnosis not present

## 2021-09-21 DIAGNOSIS — Z9889 Other specified postprocedural states: Secondary | ICD-10-CM | POA: Diagnosis not present

## 2021-09-21 DIAGNOSIS — Z9989 Dependence on other enabling machines and devices: Secondary | ICD-10-CM | POA: Diagnosis not present

## 2021-09-21 DIAGNOSIS — I5023 Acute on chronic systolic (congestive) heart failure: Secondary | ICD-10-CM | POA: Diagnosis not present

## 2021-09-21 DIAGNOSIS — Z45018 Encounter for adjustment and management of other part of cardiac pacemaker: Secondary | ICD-10-CM | POA: Diagnosis not present

## 2021-09-21 DIAGNOSIS — I340 Nonrheumatic mitral (valve) insufficiency: Secondary | ICD-10-CM | POA: Diagnosis not present

## 2021-09-21 DIAGNOSIS — Z9981 Dependence on supplemental oxygen: Secondary | ICD-10-CM | POA: Diagnosis not present

## 2021-09-21 DIAGNOSIS — N184 Chronic kidney disease, stage 4 (severe): Secondary | ICD-10-CM | POA: Diagnosis not present

## 2021-09-21 DIAGNOSIS — I252 Old myocardial infarction: Secondary | ICD-10-CM | POA: Diagnosis not present

## 2021-09-21 DIAGNOSIS — I480 Paroxysmal atrial fibrillation: Secondary | ICD-10-CM | POA: Diagnosis not present

## 2021-09-21 DIAGNOSIS — Z7901 Long term (current) use of anticoagulants: Secondary | ICD-10-CM | POA: Diagnosis not present

## 2021-09-21 DIAGNOSIS — Z955 Presence of coronary angioplasty implant and graft: Secondary | ICD-10-CM | POA: Diagnosis not present

## 2021-09-21 DIAGNOSIS — M170 Bilateral primary osteoarthritis of knee: Secondary | ICD-10-CM | POA: Diagnosis not present

## 2021-09-21 DIAGNOSIS — I2510 Atherosclerotic heart disease of native coronary artery without angina pectoris: Secondary | ICD-10-CM | POA: Diagnosis not present

## 2021-09-21 DIAGNOSIS — I420 Dilated cardiomyopathy: Secondary | ICD-10-CM | POA: Diagnosis not present

## 2021-09-21 DIAGNOSIS — Z87891 Personal history of nicotine dependence: Secondary | ICD-10-CM | POA: Diagnosis not present

## 2021-09-21 DIAGNOSIS — I255 Ischemic cardiomyopathy: Secondary | ICD-10-CM | POA: Diagnosis not present

## 2021-09-21 DIAGNOSIS — Z8616 Personal history of COVID-19: Secondary | ICD-10-CM | POA: Diagnosis not present

## 2021-09-21 DIAGNOSIS — I5022 Chronic systolic (congestive) heart failure: Secondary | ICD-10-CM | POA: Diagnosis not present

## 2021-09-21 DIAGNOSIS — I110 Hypertensive heart disease with heart failure: Secondary | ICD-10-CM | POA: Diagnosis not present

## 2021-09-21 DIAGNOSIS — M25471 Effusion, right ankle: Secondary | ICD-10-CM | POA: Diagnosis not present

## 2021-09-21 DIAGNOSIS — Z950 Presence of cardiac pacemaker: Secondary | ICD-10-CM | POA: Diagnosis not present

## 2021-09-21 DIAGNOSIS — R2689 Other abnormalities of gait and mobility: Secondary | ICD-10-CM | POA: Diagnosis not present

## 2021-09-21 LAB — COMPREHENSIVE METABOLIC PANEL
ALT: 30 IU/L (ref 0–44)
AST: 30 IU/L (ref 0–40)
Albumin/Globulin Ratio: 1.2 (ref 1.2–2.2)
Albumin: 3.6 g/dL — ABNORMAL LOW (ref 3.7–4.7)
Alkaline Phosphatase: 125 IU/L — ABNORMAL HIGH (ref 44–121)
BUN/Creatinine Ratio: 19 (ref 10–24)
BUN: 64 mg/dL — ABNORMAL HIGH (ref 8–27)
Bilirubin Total: 0.7 mg/dL (ref 0.0–1.2)
CO2: 21 mmol/L (ref 20–29)
Calcium: 8.3 mg/dL — ABNORMAL LOW (ref 8.6–10.2)
Chloride: 99 mmol/L (ref 96–106)
Creatinine, Ser: 3.33 mg/dL — ABNORMAL HIGH (ref 0.76–1.27)
Globulin, Total: 3 g/dL (ref 1.5–4.5)
Glucose: 105 mg/dL — ABNORMAL HIGH (ref 70–99)
Potassium: 4.2 mmol/L (ref 3.5–5.2)
Sodium: 137 mmol/L (ref 134–144)
Total Protein: 6.6 g/dL (ref 6.0–8.5)
eGFR: 18 mL/min/{1.73_m2} — ABNORMAL LOW (ref >59)

## 2021-09-21 LAB — PROTIME-INR
INR: 4 — ABNORMAL HIGH (ref 0.9–1.2)
Prothrombin Time: 39.2 s — ABNORMAL HIGH (ref 9.1–12.0)

## 2021-09-21 LAB — PRO B NATRIURETIC PEPTIDE: NT-Pro BNP: 8700 pg/mL — ABNORMAL HIGH (ref 0–486)

## 2021-09-21 NOTE — Progress Notes (Signed)
INR 4.0, needs to hold coumadin for 3 days and start 1 mg lower, BNP 8, 700 slightly increased, Creatnine 3.33 eGFR 18, calcium low, liver tests normal ?lp

## 2021-09-27 ENCOUNTER — Other Ambulatory Visit: Payer: Self-pay | Admitting: Family Medicine

## 2021-09-27 ENCOUNTER — Telehealth: Payer: Self-pay

## 2021-09-27 DIAGNOSIS — Z8616 Personal history of COVID-19: Secondary | ICD-10-CM | POA: Diagnosis not present

## 2021-09-27 DIAGNOSIS — Z955 Presence of coronary angioplasty implant and graft: Secondary | ICD-10-CM | POA: Diagnosis not present

## 2021-09-27 DIAGNOSIS — Z7901 Long term (current) use of anticoagulants: Secondary | ICD-10-CM | POA: Diagnosis not present

## 2021-09-27 DIAGNOSIS — Z950 Presence of cardiac pacemaker: Secondary | ICD-10-CM | POA: Diagnosis not present

## 2021-09-27 DIAGNOSIS — E119 Type 2 diabetes mellitus without complications: Secondary | ICD-10-CM | POA: Diagnosis not present

## 2021-09-27 DIAGNOSIS — R2689 Other abnormalities of gait and mobility: Secondary | ICD-10-CM | POA: Diagnosis not present

## 2021-09-27 DIAGNOSIS — G4733 Obstructive sleep apnea (adult) (pediatric): Secondary | ICD-10-CM | POA: Diagnosis not present

## 2021-09-27 DIAGNOSIS — I2510 Atherosclerotic heart disease of native coronary artery without angina pectoris: Secondary | ICD-10-CM | POA: Diagnosis not present

## 2021-09-27 DIAGNOSIS — E785 Hyperlipidemia, unspecified: Secondary | ICD-10-CM | POA: Diagnosis not present

## 2021-09-27 DIAGNOSIS — M170 Bilateral primary osteoarthritis of knee: Secondary | ICD-10-CM | POA: Diagnosis not present

## 2021-09-27 DIAGNOSIS — I110 Hypertensive heart disease with heart failure: Secondary | ICD-10-CM | POA: Diagnosis not present

## 2021-09-27 DIAGNOSIS — R5381 Other malaise: Secondary | ICD-10-CM | POA: Diagnosis not present

## 2021-09-27 DIAGNOSIS — I340 Nonrheumatic mitral (valve) insufficiency: Secondary | ICD-10-CM | POA: Diagnosis not present

## 2021-09-27 DIAGNOSIS — Z9981 Dependence on supplemental oxygen: Secondary | ICD-10-CM | POA: Diagnosis not present

## 2021-09-27 DIAGNOSIS — I5023 Acute on chronic systolic (congestive) heart failure: Secondary | ICD-10-CM | POA: Diagnosis not present

## 2021-09-27 DIAGNOSIS — Z9989 Dependence on other enabling machines and devices: Secondary | ICD-10-CM | POA: Diagnosis not present

## 2021-09-27 DIAGNOSIS — I480 Paroxysmal atrial fibrillation: Secondary | ICD-10-CM | POA: Diagnosis not present

## 2021-09-27 DIAGNOSIS — Z87891 Personal history of nicotine dependence: Secondary | ICD-10-CM | POA: Diagnosis not present

## 2021-09-27 MED ORDER — WARFARIN SODIUM 4 MG PO TABS: 4.0000 mg | ORAL_TABLET | Freq: Every day | ORAL | 0 refills | Status: AC

## 2021-09-27 NOTE — Telephone Encounter (Signed)
The Home Health Nurse called with concerns about Steven Hester.  He is having posterior wheezing but no fever reported.  He was scheduled for the nurse tomorrow but his appointment was changed to Dr. Henrene Pastor.

## 2021-09-28 ENCOUNTER — Ambulatory Visit: Payer: Medicare Other

## 2021-09-28 ENCOUNTER — Ambulatory Visit (INDEPENDENT_AMBULATORY_CARE_PROVIDER_SITE_OTHER): Payer: Medicare Other | Admitting: Legal Medicine

## 2021-09-28 ENCOUNTER — Encounter: Payer: Self-pay | Admitting: Legal Medicine

## 2021-09-28 VITALS — BP 100/64 | HR 67 | Temp 97.8°F | Resp 15 | Ht 73.0 in | Wt 287.0 lb

## 2021-09-28 DIAGNOSIS — I110 Hypertensive heart disease with heart failure: Secondary | ICD-10-CM | POA: Diagnosis not present

## 2021-09-28 DIAGNOSIS — I088 Other rheumatic multiple valve diseases: Secondary | ICD-10-CM | POA: Diagnosis not present

## 2021-09-28 DIAGNOSIS — Z955 Presence of coronary angioplasty implant and graft: Secondary | ICD-10-CM | POA: Diagnosis not present

## 2021-09-28 DIAGNOSIS — I272 Pulmonary hypertension, unspecified: Secondary | ICD-10-CM | POA: Diagnosis not present

## 2021-09-28 DIAGNOSIS — I5043 Acute on chronic combined systolic (congestive) and diastolic (congestive) heart failure: Secondary | ICD-10-CM | POA: Diagnosis not present

## 2021-09-28 DIAGNOSIS — Z87891 Personal history of nicotine dependence: Secondary | ICD-10-CM | POA: Diagnosis not present

## 2021-09-28 DIAGNOSIS — R5381 Other malaise: Secondary | ICD-10-CM | POA: Diagnosis not present

## 2021-09-28 DIAGNOSIS — Z743 Need for continuous supervision: Secondary | ICD-10-CM | POA: Diagnosis not present

## 2021-09-28 DIAGNOSIS — Z950 Presence of cardiac pacemaker: Secondary | ICD-10-CM | POA: Diagnosis not present

## 2021-09-28 DIAGNOSIS — Z7901 Long term (current) use of anticoagulants: Secondary | ICD-10-CM | POA: Diagnosis not present

## 2021-09-28 DIAGNOSIS — G4733 Obstructive sleep apnea (adult) (pediatric): Secondary | ICD-10-CM | POA: Diagnosis not present

## 2021-09-28 DIAGNOSIS — R2689 Other abnormalities of gait and mobility: Secondary | ICD-10-CM | POA: Diagnosis not present

## 2021-09-28 DIAGNOSIS — Z9981 Dependence on supplemental oxygen: Secondary | ICD-10-CM | POA: Diagnosis not present

## 2021-09-28 DIAGNOSIS — E119 Type 2 diabetes mellitus without complications: Secondary | ICD-10-CM | POA: Diagnosis not present

## 2021-09-28 DIAGNOSIS — J9601 Acute respiratory failure with hypoxia: Secondary | ICD-10-CM

## 2021-09-28 DIAGNOSIS — E1122 Type 2 diabetes mellitus with diabetic chronic kidney disease: Secondary | ICD-10-CM | POA: Diagnosis not present

## 2021-09-28 DIAGNOSIS — Z9989 Dependence on other enabling machines and devices: Secondary | ICD-10-CM | POA: Diagnosis not present

## 2021-09-28 DIAGNOSIS — R609 Edema, unspecified: Secondary | ICD-10-CM | POA: Diagnosis not present

## 2021-09-28 DIAGNOSIS — R0602 Shortness of breath: Secondary | ICD-10-CM | POA: Diagnosis not present

## 2021-09-28 DIAGNOSIS — N184 Chronic kidney disease, stage 4 (severe): Secondary | ICD-10-CM | POA: Diagnosis not present

## 2021-09-28 DIAGNOSIS — I4811 Longstanding persistent atrial fibrillation: Secondary | ICD-10-CM | POA: Diagnosis not present

## 2021-09-28 DIAGNOSIS — Z79899 Other long term (current) drug therapy: Secondary | ICD-10-CM | POA: Diagnosis not present

## 2021-09-28 DIAGNOSIS — M170 Bilateral primary osteoarthritis of knee: Secondary | ICD-10-CM | POA: Diagnosis not present

## 2021-09-28 DIAGNOSIS — N171 Acute kidney failure with acute cortical necrosis: Secondary | ICD-10-CM | POA: Diagnosis not present

## 2021-09-28 DIAGNOSIS — E785 Hyperlipidemia, unspecified: Secondary | ICD-10-CM | POA: Diagnosis not present

## 2021-09-28 DIAGNOSIS — J9811 Atelectasis: Secondary | ICD-10-CM | POA: Diagnosis not present

## 2021-09-28 DIAGNOSIS — I5023 Acute on chronic systolic (congestive) heart failure: Secondary | ICD-10-CM | POA: Diagnosis not present

## 2021-09-28 DIAGNOSIS — I130 Hypertensive heart and chronic kidney disease with heart failure and stage 1 through stage 4 chronic kidney disease, or unspecified chronic kidney disease: Secondary | ICD-10-CM | POA: Diagnosis not present

## 2021-09-28 DIAGNOSIS — I480 Paroxysmal atrial fibrillation: Secondary | ICD-10-CM | POA: Diagnosis not present

## 2021-09-28 DIAGNOSIS — Z8616 Personal history of COVID-19: Secondary | ICD-10-CM | POA: Diagnosis not present

## 2021-09-28 DIAGNOSIS — I2510 Atherosclerotic heart disease of native coronary artery without angina pectoris: Secondary | ICD-10-CM | POA: Diagnosis not present

## 2021-09-28 DIAGNOSIS — I340 Nonrheumatic mitral (valve) insufficiency: Secondary | ICD-10-CM | POA: Diagnosis not present

## 2021-09-28 NOTE — Progress Notes (Signed)
Subjective:  Patient ID: Steven Hester, male    DOB: 03/10/1946  Age: 76 y.o. MRN: [id number]  Chief Complaint  Patient presents with   Shortness of Breath   Wheezing    HPI: progressive dyspnea, CHF last proBNP 804 Patient mentioned that he cannot sleep because he is wheezing and shortness of breath. He is swelling on his leg and gained some weight. He has gained 10 lbs since last week and progressive pedal swelling with foot ulcers.  He is wheezing and aldactone was stopped by dr, Otho Perl.  Renal consult not yet set up.  Current Outpatient Medications on File Prior to Visit  Medication Sig Dispense Refill   ferrous sulfate 325 (65 FE) MG tablet 1 tablet 2 TIMES DAILY (route: oral)     ACCU-CHEK GUIDE test strip USE 1  TWICE DAILY 100 each 0   acetaminophen-codeine (TYLENOL #3) 300-30 MG tablet Take 2 tablets by mouth every 4 (four) hours as needed for moderate pain. 60 tablet 3   amiodarone (PACERONE) 200 MG tablet Take 200 mg by mouth daily.     atorvastatin (LIPITOR) 80 MG tablet Take 80 mg by mouth daily.     carvedilol (COREG) 3.125 MG tablet Take 3.125 mg by mouth 2 (two) times daily.     furosemide (LASIX) 40 MG tablet Take 1.5 tablets (60 mg total) by mouth 2 (two) times daily. 30 tablet 0   isosorbide mononitrate (IMDUR) 30 MG 24 hr tablet Take 30 mg by mouth daily. (Patient not taking: Reported on 09/20/2021)     nitroGLYCERIN (NITROSTAT) 0.4 MG SL tablet Place under the tongue.     potassium chloride SA (KLOR-CON M) 20 MEQ tablet Take 1 tablet by mouth daily.     Sennosides (LAXATIVE) 25 MG TABS Take 25 mg by mouth daily.     spironolactone (ALDACTONE) 25 MG tablet Take 1 tablet (25 mg total) by mouth daily. 30 tablet 3   warfarin (COUMADIN) 4 MG tablet Take 1 tablet (4 mg total) by mouth daily. 30 tablet 0   zolpidem (AMBIEN) 5 MG tablet Take 1 tablet (5 mg total) by mouth at bedtime as needed for sleep. 15 tablet 3   No current facility-administered medications on file  prior to visit.   Past Medical History:  Diagnosis Date   CAD (coronary artery disease)    DES to RCA, circumflex, OM in [place]   CHF (congestive heart failure) (HCC)    CKD (chronic kidney disease) stage 4, GFR 15-29 ml/min (HCC)    Essential hypertension    Inferior myocardial infarction (HCC)    Ischemic cardiomyopathy    Mitral regurgitation    Mixed hyperlipidemia    Pacemaker    Medtronic   Persistent atrial fibrillation (HCC)    Sinus node dysfunction (HCC)    Type 2 diabetes mellitus ([address])    Past Surgical History:  Procedure Laterality Date   COLON SURGERY     CORONARY ANGIOPLASTY WITH STENT PLACEMENT     PACEMAKER INSERTION      Family History  Problem Relation Age of Onset   Heart attack Mother    GI Disease Father    Cancer Father    Social History   Socioeconomic History   Marital status: Married    Spouse name: Silva Bandy   Number of children: 1   Years of education: Not on file   Highest education level: Not on file  Occupational History   Not on file  Tobacco  Use   Smoking status: Former    Packs/day: 1.00    Years: 10.00    Pack years: 10.00    Types: Cigarettes   Smokeless tobacco: Never  Vaping Use   Vaping Use: Never used  Substance and Sexual Activity   Alcohol use: Yes    Comment: occasionally   Drug use: Never   Sexual activity: Not Currently    Partners: Female  Other Topics Concern   Not on file  Social History Narrative   Not on file   Social Determinants of Health   Financial Resource Strain: Not on file  Food Insecurity: No Food Insecurity   Worried About Running Out of Food in the Last Year: Never true   Ran Out of Food in the Last Year: Never true  Transportation Needs: No Transportation Needs   Lack of Transportation (Medical): No   Lack of Transportation (Non-Medical): No  Physical Activity: Not on file  Stress: Not on file  Social Connections: Not on file    Review of Systems  Constitutional:   Negative for chills, fatigue, fever and unexpected weight change.  HENT:  Negative for congestion, ear pain, sinus pain and sore throat.   Eyes:  Negative for visual disturbance.  Respiratory:  Positive for cough, chest tightness, shortness of breath and wheezing.   Cardiovascular:  Negative for chest pain and palpitations.  Gastrointestinal:  Negative for abdominal pain, blood in stool, constipation, diarrhea, nausea and vomiting.  Endocrine: Negative for polydipsia.  Genitourinary:  Negative for dysuria.  Musculoskeletal:  Negative for back pain.  Skin:  Negative for rash.  Neurological:  Negative for headaches.    Objective:  BP 100/64   Pulse 67   Temp 97.8 F (36.6 C)   Resp 15   Ht '6\' 1"'$  (1.854 m)   Wt 287 lb (130.2 kg)   SpO2 93% Comment: 2 liters  BMI 37.87 kg/m      09/28/2021    2:40 PM 09/20/2021    9:31 AM 08/09/2021    7:28 AM  BP/Weight  Systolic BP 595 638 756  Diastolic BP 64 58 62  Wt. (Lbs) 287 277 289.4  BMI 37.87 kg/m2 36.55 kg/m2 38.18 kg/m2    Physical Exam Vitals reviewed.  Constitutional:      General: He is in acute distress.     Appearance: He is ill-appearing.  HENT:     Mouth/Throat:     Mouth: Mucous membranes are moist.     Pharynx: Oropharynx is clear.  Eyes:     Conjunctiva/sclera: Conjunctivae normal.     Pupils: Pupils are equal, round, and reactive to light.  Cardiovascular:     Rate and Rhythm: Normal rate.     Heart sounds:    Gallop (positive S3,) present.  Pulmonary:     Effort: Pulmonary effort is normal.     Breath sounds: Rales present.     Comments: Basilar rales 1/2 way up lung Musculoskeletal:     Right lower leg: Edema present.     Left lower leg: Edema present.     Comments: 4+ pedal edema  Skin:    Capillary Refill: Capillary refill takes less than 2 seconds.     Findings: Lesion present.     Comments: Stasis ulcers both legs  Neurological:     General: No focal deficit present.     Mental Status: He is  alert and oriented to person, place, and time.     Gait: Gait abnormal.  Lab Results  Component Value Date   WBC 5.7 08/09/2021   HGB 9.6 (L) 08/09/2021   HCT 31.6 (L) 08/09/2021   PLT 144 (L) 08/09/2021   GLUCOSE 105 (H) 09/20/2021   CHOL 78 (L) 08/09/2021   TRIG 72 08/09/2021   HDL 27 (L) 08/09/2021   LDLCALC 35 08/09/2021   ALT 30 09/20/2021   AST 30 09/20/2021   NA 137 09/20/2021   K 4.2 09/20/2021   CL 99 09/20/2021   CREATININE 3.33 (H) 09/20/2021   BUN 64 (H) 09/20/2021   CO2 21 09/20/2021   INR 4.0 (H) 09/20/2021   HGBA1C 6.2 (H) 08/09/2021      Assessment & Plan:   Problem List Items Addressed This Visit       Cardiovascular and Mediastinum   Acute on chronic combined systolic and diastolic CHF (congestive heart failure) (HCC)   Relevant Medications   atorvastatin (LIPITOR) 80 MG tablet Patient was released recently and released from [facility] for congestive heart failure and stage IV-V renal failure he is continued to have extreme shortness of breath and has gained 10 pounds since last week he has seen Dr. Otho Perl recently we will try to get him into renal but this has not happened yet he is unable to lie flat and has rales at least halfway up his lungs and 4+ pitting edema of the legs I had to increase his oxygen to 3 L/min.  Patient has progressive and recalcitrant congestive heart failure with severe renal disease and probably will require dialysis in order to decrease the fluid load.  He is at very high risk at the present time.     Respiratory   Acute respiratory failure with hypoxia (HCC) Patient has chronic respiratory failure and is on chronic oxygen 2 L/min his O2 sat is only 92% sitting here and we increased to 3% which improved his oxygen.  He will be transferred to the hospital.     Genitourinary   Acute kidney failure, unspecified ([place]) - Primary Patient is having acute kidney failure on top of his chronic renal disease that has  been unresponsive to diuretics his kidney function was too bad even tried low-dose aldosterone.  He will be transported to [facility] patient is informed and agrees with this.  .       Follow-up: Return transfer to [facility] for acute CHF with hepatorenal syndrome.  An After Visit Summary was printed and given to the patient.  Reinaldo Meeker, MD [facility] [phone]

## 2021-09-29 DIAGNOSIS — I5023 Acute on chronic systolic (congestive) heart failure: Secondary | ICD-10-CM | POA: Diagnosis not present

## 2021-09-29 DIAGNOSIS — R0602 Shortness of breath: Secondary | ICD-10-CM | POA: Diagnosis not present

## 2021-09-29 DIAGNOSIS — I4811 Longstanding persistent atrial fibrillation: Secondary | ICD-10-CM | POA: Diagnosis not present

## 2021-10-03 DIAGNOSIS — J9611 Chronic respiratory failure with hypoxia: Secondary | ICD-10-CM | POA: Diagnosis not present

## 2021-10-04 ENCOUNTER — Other Ambulatory Visit: Payer: Self-pay

## 2021-10-04 DIAGNOSIS — I5023 Acute on chronic systolic (congestive) heart failure: Secondary | ICD-10-CM | POA: Diagnosis not present

## 2021-10-04 DIAGNOSIS — Z8616 Personal history of COVID-19: Secondary | ICD-10-CM | POA: Diagnosis not present

## 2021-10-04 DIAGNOSIS — M170 Bilateral primary osteoarthritis of knee: Secondary | ICD-10-CM | POA: Diagnosis not present

## 2021-10-04 DIAGNOSIS — Z9981 Dependence on supplemental oxygen: Secondary | ICD-10-CM | POA: Diagnosis not present

## 2021-10-04 DIAGNOSIS — E119 Type 2 diabetes mellitus without complications: Secondary | ICD-10-CM | POA: Diagnosis not present

## 2021-10-04 DIAGNOSIS — I110 Hypertensive heart disease with heart failure: Secondary | ICD-10-CM | POA: Diagnosis not present

## 2021-10-04 DIAGNOSIS — I340 Nonrheumatic mitral (valve) insufficiency: Secondary | ICD-10-CM | POA: Diagnosis not present

## 2021-10-04 DIAGNOSIS — Z955 Presence of coronary angioplasty implant and graft: Secondary | ICD-10-CM | POA: Diagnosis not present

## 2021-10-04 DIAGNOSIS — R2689 Other abnormalities of gait and mobility: Secondary | ICD-10-CM | POA: Diagnosis not present

## 2021-10-04 DIAGNOSIS — I2510 Atherosclerotic heart disease of native coronary artery without angina pectoris: Secondary | ICD-10-CM | POA: Diagnosis not present

## 2021-10-04 DIAGNOSIS — Z950 Presence of cardiac pacemaker: Secondary | ICD-10-CM | POA: Diagnosis not present

## 2021-10-04 DIAGNOSIS — Z9989 Dependence on other enabling machines and devices: Secondary | ICD-10-CM | POA: Diagnosis not present

## 2021-10-04 DIAGNOSIS — G4733 Obstructive sleep apnea (adult) (pediatric): Secondary | ICD-10-CM | POA: Diagnosis not present

## 2021-10-04 DIAGNOSIS — R5381 Other malaise: Secondary | ICD-10-CM | POA: Diagnosis not present

## 2021-10-04 DIAGNOSIS — E785 Hyperlipidemia, unspecified: Secondary | ICD-10-CM | POA: Diagnosis not present

## 2021-10-04 DIAGNOSIS — Z7901 Long term (current) use of anticoagulants: Secondary | ICD-10-CM | POA: Diagnosis not present

## 2021-10-04 DIAGNOSIS — I83019 Varicose veins of right lower extremity with ulcer of unspecified site: Secondary | ICD-10-CM

## 2021-10-04 DIAGNOSIS — I83013 Varicose veins of right lower extremity with ulcer of ankle: Secondary | ICD-10-CM

## 2021-10-04 DIAGNOSIS — Z87891 Personal history of nicotine dependence: Secondary | ICD-10-CM | POA: Diagnosis not present

## 2021-10-04 DIAGNOSIS — I480 Paroxysmal atrial fibrillation: Secondary | ICD-10-CM | POA: Diagnosis not present

## 2021-10-05 ENCOUNTER — Encounter: Payer: Self-pay | Admitting: Nurse Practitioner

## 2021-10-05 ENCOUNTER — Ambulatory Visit (INDEPENDENT_AMBULATORY_CARE_PROVIDER_SITE_OTHER): Payer: Medicare Other | Admitting: Nurse Practitioner

## 2021-10-05 VITALS — BP 112/80 | HR 77 | Temp 97.1°F | Ht 73.0 in | Wt 283.0 lb

## 2021-10-05 DIAGNOSIS — L97319 Non-pressure chronic ulcer of right ankle with unspecified severity: Secondary | ICD-10-CM | POA: Diagnosis not present

## 2021-10-05 DIAGNOSIS — N171 Acute kidney failure with acute cortical necrosis: Secondary | ICD-10-CM

## 2021-10-05 DIAGNOSIS — I5043 Acute on chronic combined systolic (congestive) and diastolic (congestive) heart failure: Secondary | ICD-10-CM | POA: Diagnosis not present

## 2021-10-05 DIAGNOSIS — I4811 Longstanding persistent atrial fibrillation: Secondary | ICD-10-CM | POA: Diagnosis not present

## 2021-10-05 DIAGNOSIS — I83019 Varicose veins of right lower extremity with ulcer of unspecified site: Secondary | ICD-10-CM | POA: Diagnosis not present

## 2021-10-05 DIAGNOSIS — H918X1 Other specified hearing loss, right ear: Secondary | ICD-10-CM

## 2021-10-05 DIAGNOSIS — J9611 Chronic respiratory failure with hypoxia: Secondary | ICD-10-CM | POA: Diagnosis not present

## 2021-10-05 DIAGNOSIS — N184 Chronic kidney disease, stage 4 (severe): Secondary | ICD-10-CM

## 2021-10-05 DIAGNOSIS — Z9981 Dependence on supplemental oxygen: Secondary | ICD-10-CM | POA: Diagnosis not present

## 2021-10-05 DIAGNOSIS — I83013 Varicose veins of right lower extremity with ulcer of ankle: Secondary | ICD-10-CM | POA: Diagnosis not present

## 2021-10-05 DIAGNOSIS — L97919 Non-pressure chronic ulcer of unspecified part of right lower leg with unspecified severity: Secondary | ICD-10-CM | POA: Diagnosis not present

## 2021-10-05 DIAGNOSIS — Z7901 Long term (current) use of anticoagulants: Secondary | ICD-10-CM | POA: Diagnosis not present

## 2021-10-05 LAB — COMPREHENSIVE METABOLIC PANEL
ALT: 22 IU/L (ref 0–44)
AST: 26 IU/L (ref 0–40)
Albumin/Globulin Ratio: 1.1 — ABNORMAL LOW (ref 1.2–2.2)
Albumin: 3.3 g/dL — ABNORMAL LOW (ref 3.7–4.7)
Alkaline Phosphatase: 111 IU/L (ref 44–121)
BUN/Creatinine Ratio: 17 (ref 10–24)
BUN: 50 mg/dL — ABNORMAL HIGH (ref 8–27)
Bilirubin Total: 0.8 mg/dL (ref 0.0–1.2)
CO2: 22 mmol/L (ref 20–29)
Calcium: 8.4 mg/dL — ABNORMAL LOW (ref 8.6–10.2)
Chloride: 104 mmol/L (ref 96–106)
Creatinine, Ser: 2.96 mg/dL — ABNORMAL HIGH (ref 0.76–1.27)
Globulin, Total: 3.1 g/dL (ref 1.5–4.5)
Glucose: 108 mg/dL — ABNORMAL HIGH (ref 70–99)
Potassium: 4.7 mmol/L (ref 3.5–5.2)
Sodium: 142 mmol/L (ref 134–144)
Total Protein: 6.4 g/dL (ref 6.0–8.5)
eGFR: 21 mL/min/{1.73_m2} — ABNORMAL LOW (ref >59)

## 2021-10-05 LAB — CBC WITH DIFFERENTIAL/PLATELET
Basophils Absolute: 0.1 10*3/uL (ref 0.0–0.2)
Basos: 1 %
EOS (ABSOLUTE): 0 10*3/uL (ref 0.0–0.4)
Eos: 0 %
Hematocrit: 31.2 % — ABNORMAL LOW (ref 37.5–51.0)
Hemoglobin: 9.4 g/dL — ABNORMAL LOW (ref 13.0–17.7)
Immature Grans (Abs): 0 10*3/uL (ref 0.0–0.1)
Immature Granulocytes: 0 %
Lymphocytes Absolute: 1.2 10*3/uL (ref 0.7–3.1)
Lymphs: 19 %
MCH: 25.8 pg — ABNORMAL LOW (ref 26.6–33.0)
MCHC: 30.1 g/dL — ABNORMAL LOW (ref 31.5–35.7)
MCV: 86 fL (ref 79–97)
Monocytes Absolute: 0.8 10*3/uL (ref 0.1–0.9)
Monocytes: 12 %
Neutrophils Absolute: 4.4 10*3/uL (ref 1.4–7.0)
Neutrophils: 68 %
Platelets: 126 10*3/uL — ABNORMAL LOW (ref 150–450)
RBC: 3.65 x10E6/uL — ABNORMAL LOW (ref 4.14–5.80)
RDW: 20.1 % — ABNORMAL HIGH (ref 11.6–15.4)
WBC: 6.4 10*3/uL (ref 3.4–10.8)

## 2021-10-05 NOTE — Patient Instructions (Addendum)
Keep appt with Dr Otho Perl as scheduled Friday, June 2nd, 4:00 pm Continue medications We will call you with lab results and appt with hearing specialist   Heart Failure Eating Plan Heart failure, also called congestive heart failure, occurs when your heart does not pump blood well enough to meet your body's needs for oxygen-rich blood. Heart failure is a long-term (chronic) condition. Living with heart failure can be challenging. Following your health care provider's instructions about a healthy lifestyle and working with a dietitian to choose the right foods may help to improve your symptoms. An eating plan for someone with heart failure will include changes that limit the intake of salt (sodium) and unhealthy fat. What are tips for following this plan? Reading food labels Check food labels for the amount of sodium per serving. Choose foods that have less than 140 mg (milligrams) of sodium in each serving. Check food labels for the number of calories per serving. This is important if you need to limit your daily calorie intake to lose weight. Check food labels for the serving size. If you eat more than one serving, you will be eating more sodium and calories than what is listed on the label. Look for foods that are labeled as "sodium-free," "very low sodium," or "low sodium." Foods labeled as "reduced sodium" or "lightly salted" may still have more sodium than what is recommended for you. Cooking Avoid adding salt when cooking. Ask your health care provider or dietitian before using salt substitutes. Season food with salt-free seasonings, spices, or herbs. Check the label of seasoning mixes to make sure they do not contain salt. Cook with heart-healthy oils, such as olive, canola, soybean, or sunflower oil. Do not fry foods. Cook foods using low-fat methods, such as baking, boiling, grilling, and broiling. Limit unhealthy fats when cooking by: Removing the skin from poultry, such as  chicken. Removing all visible fats from meats. Skimming the fat off from stews, soups, and gravies before serving them. Meal planning  Limit your intake of: Processed, canned, or prepackaged foods. Foods that are high in trans fat, such as fried foods. Sweets, desserts, sugary drinks, and other foods with added sugar. Full-fat dairy products, such as whole milk. Eat a balanced diet. This may include: 4-5 servings of fruit each day and 4-5 servings of vegetables each day. At each meal, try to fill one-half of your plate with fruits and vegetables. Up to 6-8 servings of whole grains each day. Up to 2 servings of lean meat, poultry, or fish each day. One serving of meat is equal to 3 oz (85 g). This is about the same size as a deck of cards. 2 servings of low-fat dairy each day. Heart-healthy fats. Healthy fats called omega-3 fatty acids are found in foods such as flaxseed and cold-water fish like sardines, salmon, and mackerel. Aim to eat 25-35 g (grams) of fiber a day. Foods that are high in fiber include apples, broccoli, carrots, beans, peas, and whole grains. Do not add salt or condiments that contain salt (such as soy sauce) to foods before eating. When eating at a restaurant, ask that your food be prepared with less salt or no salt, if possible. Try to eat 2 or more vegetarian meals each week. Eat more home-cooked food and eat less restaurant, buffet, and fast food. General information Do not eat more than 2,300 mg of sodium a day. The amount of sodium that is recommended for you may be lower, depending on your condition. Maintain a  healthy body weight as directed. Ask your health care provider what a healthy weight is for you. Check your weight every day. Work with your health care provider and dietitian to make a plan that is right for you to lose weight or maintain your current weight. Limit how much fluid you drink. Ask your health care provider or dietitian how much fluid you can  have each day. Limit or avoid alcohol as told by your health care provider or dietitian. Recommended foods Fruits All fresh, frozen, and canned fruits. Dried fruits, such as raisins, prunes, and cranberries. Vegetables All fresh vegetables. Vegetables that are frozen without sauce or added salt. Low-sodium or sodium-free canned vegetables. Grains Bread with less than 80 mg of sodium per slice. Whole-wheat pasta, quinoa, and brown rice. Oats and oatmeal. Barley. [place]. Grits and cream of wheat. Whole-grain and whole-wheat cold cereal. Meats and other protein foods Lean cuts of meat. Skinless chicken and [place]. Fish with high omega-3 fatty acids, such as salmon, sardines, and other cold-water fishes. Eggs. Dried beans, peas, and edamame. Unsalted nuts and nut butters. Dairy Low-fat or nonfat (skim) milk and dried milk. Rice milk, soy milk, and almond milk. Low-fat or nonfat yogurt. Small amounts of reduced-sodium block cheese. Low-sodium cottage cheese. Fats and oils Olive, canola, soybean, flaxseed, avocado, or sunflower oil. Sweets and desserts Applesauce. Granola bars. Sugar-free pudding and gelatin. Frozen fruit bars. Seasoning and other foods Fresh and dried herbs. Lemon or lime juice. Vinegar. Low-sodium ketchup. Salt-free marinades, salad dressings, sauces, and seasonings. The items listed above may not be a complete list of foods and beverages you can eat. Contact a dietitian for more information. Foods to avoid Fruits Fruits that are dried with sodium-containing preservatives. Vegetables Canned vegetables. Frozen vegetables with sauce or seasonings. Creamed vegetables. [place] fries. Onion rings. Pickled vegetables and sauerkraut. Grains Bread with more than 80 mg of sodium per slice. Hot or cold cereal with more than 140 mg sodium per serving. Salted pretzels and crackers. Prepackaged breadcrumbs. Bagels, croissants, and biscuits. Meats and other protein foods Ribs and chicken  wings. Bacon, ham, pepperoni, bologna, salami, and packaged luncheon meats. Hot dogs, bratwurst, and sausage. Canned meat. Smoked meat and fish. Salted nuts and seeds. Dairy Whole milk, half-and-half, and cream. Buttermilk. Processed cheese, cheese spreads, and cheese curds. Regular cottage cheese. Feta cheese. Shredded cheese. String cheese. Fats and oils Butter, lard, shortening, ghee, and bacon fat. Canned and packaged gravies. Seasoning and other foods Onion salt, garlic salt, table salt, and sea salt. Marinades. Regular salad dressings. Relishes, pickles, and olives. Meat flavorings and tenderizers, and bouillon cubes. Horseradish, ketchup, and mustard. Worcestershire sauce. Teriyaki sauce, soy sauce (including reduced sodium). Hot sauce and Tabasco sauce. Steak sauce, fish sauce, oyster sauce, and cocktail sauce. Taco seasonings. Barbecue sauce. Tartar sauce. The items listed above may not be a complete list of foods and beverages you should avoid. Contact a dietitian for more information. Summary A heart failure eating plan includes changes that limit your intake of sodium and unhealthy fat, and it may help you lose weight or maintain a healthy weight. Your health care provider may also recommend limiting how much fluid you drink. Most people with heart failure should eat no more than 2,300 mg of salt (sodium) a day. The amount of sodium that is recommended for you may be lower, depending on your condition. Contact your health care provider or dietitian before making any major changes to your diet. This information is not intended to replace  advice given to you by your health care provider. Make sure you discuss any questions you have with your health care provider. Document Revised: 12/08/2019 Document Reviewed: 12/08/2019 Elsevier Patient Education  [place].   Hearing Loss Hearing loss is a partial or total loss of the ability to hear. This can be temporary or permanent, and it  can happen in one or both ears. There are two types of hearing loss. You can have just one type or both types. You may have a problem with: Damage to your hearing nerves (sensorineural hearing loss). This type of hearing loss is more likely to be permanent. A hearing aid is often the best treatment. Sound getting to your inner ear (conductive hearing loss). This type of hearing loss can usually be treated medically or surgically. Medical care is necessary to treat hearing loss properly and to prevent the condition from getting worse. Your hearing may partially or completely come back, depending on what caused your hearing loss and how severe it is. In some cases, hearing loss is permanent. What are the causes? Common causes of hearing loss include: Too much wax in the ear canal. Infection of the ear canal or middle ear. Fluid in the middle ear. Injury to the ear or surrounding area. An object stuck in the ear. A history of prolonged exposure to loud sounds, such as music. Less common causes of hearing loss include: Tumors in the ear. Viral or bacterial infections, such as meningitis. A hole in the eardrum (perforated eardrum). Problems with the hearing nerve that sends signals between the brain and the ear. Certain medicines. What are the signs or symptoms? Symptoms of this condition may include: Difficulty telling the difference between sounds. Difficulty following a conversation when there is background noise. Lack of response to sounds in your environment. This may be most noticeable when you do not respond to startling sounds. Needing to turn up the volume on the television, radio, or other devices. Ringing in the ears. Dizziness. How is this diagnosed? This condition is diagnosed based on: A physical exam. A hearing test (audiometry). The test will be performed by a hearing specialist (audiologist). Imaging tests, such as an MRI or CT scan. You may also be referred to an ear,  nose, and throat (ENT) specialist (otolaryngologist). How is this treated? Treatment for hearing loss may include: Earwax removal. Medicines to treat or prevent infection (antibiotics). Medicines to reduce inflammation (corticosteroids). Hearing aids or assistive listening devices. Follow these instructions at home: If you were prescribed an antibiotic medicine, take it as told by your health care provider. Do not stop taking the antibiotic even if you start to feel better. Take over-the-counter and prescription medicines only as told by your health care provider. Avoid loud noises. Use hearing protection if you are exposed to loud noises or work in a noisy environment. Return to your normal activities as told by your health care provider. Ask your health care provider what activities are safe for you. Keep all follow-up visits. This is important. Contact a health care provider if: You feel dizzy. You develop new symptoms. You vomit or feel nauseous. You have a fever. Get help right away if: You develop sudden changes in your vision. You have severe ear pain. You have new or increased weakness. You have a severe headache. Summary Hearing loss is a decreased ability to hear sounds around you. It can be temporary or permanent. Treatment will depend on the cause of your hearing loss. It may  include earwax removal, medicines, or a hearing aid. Your hearing may partially or completely come back, depending on what caused your hearing loss and how severe it is. Keep all follow-up visits. This is important. This information is not intended to replace advice given to you by your health care provider. Make sure you discuss any questions you have with your health care provider. Document Revised: 03/03/2021 Document Reviewed: 03/03/2021 Elsevier Patient Education  [place].

## 2021-10-05 NOTE — Progress Notes (Signed)
Subjective:  Patient ID: Steven Hester, male    DOB: 04-09-1946  Age: 76 y.o. MRN: [id number]  Chief Complaint  Patient presents with   Hospitalization Follow-up    HPI  Steven Hester is a 76 year old Caucasian male that presents for hospital follow-up. He is accompanied by his adult son. Reports he has ulcers to lower left leg, treated by home health care.States wound treatments were performed yesterday. Wounds currently covered with dressings.  He tells me has hearing loss to right ear. Requested ear wax removal, states he has instilled Debrox drops to right ear.   Follow up Hospitalization  Patient was admitted to [facility] on 5/24 and discharged on 5/25. He was treated for CHF. Treatment for this included Lasix. He reports excellent compliance with treatment. He reports this condition is improved.  Current Outpatient Medications on File Prior to Visit  Medication Sig Dispense Refill   ACCU-CHEK GUIDE test strip USE 1  TWICE DAILY 100 each 0   acetaminophen-codeine (TYLENOL #3) 300-30 MG tablet Take 2 tablets by mouth every 4 (four) hours as needed for moderate pain. 60 tablet 3   amiodarone (PACERONE) 200 MG tablet Take 200 mg by mouth daily.     atorvastatin (LIPITOR) 80 MG tablet Take 80 mg by mouth daily.     carvedilol (COREG) 3.125 MG tablet Take 3.125 mg by mouth 2 (two) times daily.     ferrous sulfate 325 (65 FE) MG tablet 1 tablet 2 TIMES DAILY (route: oral)     furosemide (LASIX) 40 MG tablet Take 1.5 tablets (60 mg total) by mouth 2 (two) times daily. 30 tablet 0   isosorbide mononitrate (IMDUR) 30 MG 24 hr tablet Take 30 mg by mouth daily. (Patient not taking: Reported on 09/20/2021)     nitroGLYCERIN (NITROSTAT) 0.4 MG SL tablet Place under the tongue.     potassium chloride SA (KLOR-CON M) 20 MEQ tablet Take 1 tablet by mouth daily.     Sennosides (LAXATIVE) 25 MG TABS Take 25 mg by mouth daily.     spironolactone (ALDACTONE) 25 MG tablet Take 1 tablet (25 mg  total) by mouth daily. 30 tablet 3   warfarin (COUMADIN) 4 MG tablet Take 1 tablet (4 mg total) by mouth daily. 30 tablet 0   zolpidem (AMBIEN) 5 MG tablet Take 1 tablet (5 mg total) by mouth at bedtime as needed for sleep. 15 tablet 3   No current facility-administered medications on file prior to visit.   Past Medical History:  Diagnosis Date   CAD (coronary artery disease)    DES to RCA, circumflex, OM in [place]   CHF (congestive heart failure) (HCC)    CKD (chronic kidney disease) stage 4, GFR 15-29 ml/min (HCC)    Essential hypertension    Inferior myocardial infarction (HCC)    Ischemic cardiomyopathy    Mitral regurgitation    Mixed hyperlipidemia    Pacemaker    Medtronic   Persistent atrial fibrillation (HCC)    Sinus node dysfunction (HCC)    Type 2 diabetes mellitus ([place])    Past Surgical History:  Procedure Laterality Date   COLON SURGERY     CORONARY ANGIOPLASTY WITH STENT PLACEMENT     PACEMAKER INSERTION      Family History  Problem Relation Age of Onset   Heart attack Mother    GI Disease Father    Cancer Father    Social History   Socioeconomic History   Marital status: Married  Spouse name: Silva Bandy   Number of children: 1   Years of education: Not on file   Highest education level: Not on file  Occupational History   Not on file  Tobacco Use   Smoking status: Former    Packs/day: 1.00    Years: 10.00    Pack years: 10.00    Types: Cigarettes   Smokeless tobacco: Never  Vaping Use   Vaping Use: Never used  Substance and Sexual Activity   Alcohol use: Yes    Comment: occasionally   Drug use: Never   Sexual activity: Not Currently    Partners: Female  Other Topics Concern   Not on file  Social History Narrative   Not on file   Social Determinants of Health   Financial Resource Strain: Not on file  Food Insecurity: No Food Insecurity   Worried About Running Out of Food in the Last Year: Never true   Ran Out of Food  in the Last Year: Never true  Transportation Needs: No Transportation Needs   Lack of Transportation (Medical): No   Lack of Transportation (Non-Medical): No  Physical Activity: Not on file  Stress: Not on file  Social Connections: Not on file    Review of Systems  Constitutional:  Negative for chills, fatigue and fever.  HENT:  Positive for hearing loss (right ear).   Respiratory:  Negative for cough and shortness of breath.   Cardiovascular:  Negative for chest pain and leg swelling.  Skin:  Positive for wound (Left leg, ankle).  Neurological:  Negative for dizziness and headaches.    Objective:  BP 112/80   Pulse 77   Temp (!) 97.1 F (36.2 C)   Ht '6\' 1"'$  (1.854 m)   Wt 283 lb (128.4 kg)   SpO2 100%   BMI 37.34 kg/m      10/05/2021   10:00 AM 09/28/2021    2:40 PM 09/20/2021    9:31 AM  BP/Weight  Systolic BP 993 570 177  Diastolic BP 80 64 58  Wt. (Lbs) 283 287 277  BMI 37.34 kg/m2 37.87 kg/m2 36.55 kg/m2    Physical Exam Vitals reviewed.  Constitutional:      Appearance: Normal appearance.  HENT:     Right Ear: Tympanic membrane normal.     Left Ear: Tympanic membrane normal.  Cardiovascular:     Rate and Rhythm: Normal rate. Rhythm irregular.     Pulses: Normal pulses.     Heart sounds: Normal heart sounds.  Pulmonary:     Effort: Pulmonary effort is normal.     Breath sounds: Normal breath sounds.  Abdominal:     General: Bowel sounds are normal.     Palpations: Abdomen is soft.  Musculoskeletal:        General: Swelling (bilateral ankles) present.  Skin:    General: Skin is warm and dry.     Capillary Refill: Capillary refill takes less than 2 seconds.  Neurological:     General: No focal deficit present.     Mental Status: He is alert and oriented to person, place, and time.  Psychiatric:        Mood and Affect: Mood normal.        Behavior: Behavior normal.        Lab Results  Component Value Date   WBC 5.7 08/09/2021   HGB 9.6 (L)  08/09/2021   HCT 31.6 (L) 08/09/2021   PLT 144 (L) 08/09/2021   GLUCOSE 105 (H)  09/20/2021   CHOL 78 (L) 08/09/2021   TRIG 72 08/09/2021   HDL 27 (L) 08/09/2021   LDLCALC 35 08/09/2021   ALT 30 09/20/2021   AST 30 09/20/2021   NA 137 09/20/2021   K 4.2 09/20/2021   CL 99 09/20/2021   CREATININE 3.33 (H) 09/20/2021   BUN 64 (H) 09/20/2021   CO2 21 09/20/2021   INR 4.0 (H) 09/20/2021   HGBA1C 6.2 (H) 08/09/2021      Assessment & Plan:   1. Acute on chronic combined systolic and diastolic CHF (congestive heart failure) (HCC) - CBC with Differential/Platelet - Comprehensive metabolic panel -continue Lasix 40 mg BID -continue low sodium diet -keep appt with cardiology on 10/08/21 as scheduled  2. Venous ulcer of ankle, right (HCC) - CBC with Differential/Platelet - Comprehensive metabolic panel -continue home health wound care as scheduled  3. Venous ulcer of right leg (HCC) - CBC with Differential/Platelet - Comprehensive metabolic panel -continue wound care per home health  4. CKD (chronic kidney disease) stage 4, GFR 15-29 ml/min (HCC) - CBC with Differential/Platelet - Comprehensive metabolic panel -follow-up with [facility] nephrologist  5. Other specified hearing loss of right ear, unspecified hearing status on contralateral side - Ambulatory referral to Audiology  6. Current use of long term anticoagulation - CBC with Differential/Platelet - Comprehensive metabolic panel  7. Chronic respiratory failure with hypoxia, on home oxygen therapy (HCC) - CBC with Differential/Platelet - Comprehensive metabolic panel  8. Longstanding persistent atrial fibrillation (HCC) - CBC with Differential/Platelet - Comprehensive metabolic panel       Keep appt with Dr Otho Perl as scheduled Friday, June 2nd, 4:00 pm Continue medications We will call you with lab results and appt with hearing specialist  Follow-up: PRN  An After Visit Summary was printed and given  to the patient.   I, Rip Harbour, NP, have reviewed all documentation for this visit. The documentation on 10/05/21 for the exam, diagnosis, procedures, and orders are all accurate and complete.    Signed, Rip Harbour, NP [place] [phone]

## 2021-10-07 DIAGNOSIS — Z7901 Long term (current) use of anticoagulants: Secondary | ICD-10-CM | POA: Diagnosis not present

## 2021-10-07 DIAGNOSIS — Z8616 Personal history of COVID-19: Secondary | ICD-10-CM | POA: Diagnosis not present

## 2021-10-07 DIAGNOSIS — I2510 Atherosclerotic heart disease of native coronary artery without angina pectoris: Secondary | ICD-10-CM | POA: Diagnosis not present

## 2021-10-07 DIAGNOSIS — N184 Chronic kidney disease, stage 4 (severe): Secondary | ICD-10-CM | POA: Diagnosis not present

## 2021-10-07 DIAGNOSIS — E119 Type 2 diabetes mellitus without complications: Secondary | ICD-10-CM | POA: Diagnosis not present

## 2021-10-07 DIAGNOSIS — E785 Hyperlipidemia, unspecified: Secondary | ICD-10-CM | POA: Diagnosis not present

## 2021-10-07 DIAGNOSIS — Z45018 Encounter for adjustment and management of other part of cardiac pacemaker: Secondary | ICD-10-CM | POA: Diagnosis not present

## 2021-10-07 DIAGNOSIS — Z87891 Personal history of nicotine dependence: Secondary | ICD-10-CM | POA: Diagnosis not present

## 2021-10-07 DIAGNOSIS — I110 Hypertensive heart disease with heart failure: Secondary | ICD-10-CM | POA: Diagnosis not present

## 2021-10-07 DIAGNOSIS — Z9981 Dependence on supplemental oxygen: Secondary | ICD-10-CM | POA: Diagnosis not present

## 2021-10-07 DIAGNOSIS — Z955 Presence of coronary angioplasty implant and graft: Secondary | ICD-10-CM | POA: Diagnosis not present

## 2021-10-07 DIAGNOSIS — Z9989 Dependence on other enabling machines and devices: Secondary | ICD-10-CM | POA: Diagnosis not present

## 2021-10-07 DIAGNOSIS — Z950 Presence of cardiac pacemaker: Secondary | ICD-10-CM | POA: Diagnosis not present

## 2021-10-07 DIAGNOSIS — R5381 Other malaise: Secondary | ICD-10-CM | POA: Diagnosis not present

## 2021-10-07 DIAGNOSIS — I340 Nonrheumatic mitral (valve) insufficiency: Secondary | ICD-10-CM | POA: Diagnosis not present

## 2021-10-07 DIAGNOSIS — I255 Ischemic cardiomyopathy: Secondary | ICD-10-CM | POA: Diagnosis not present

## 2021-10-07 DIAGNOSIS — M170 Bilateral primary osteoarthritis of knee: Secondary | ICD-10-CM | POA: Diagnosis not present

## 2021-10-07 DIAGNOSIS — I420 Dilated cardiomyopathy: Secondary | ICD-10-CM | POA: Diagnosis not present

## 2021-10-07 DIAGNOSIS — I5023 Acute on chronic systolic (congestive) heart failure: Secondary | ICD-10-CM | POA: Diagnosis not present

## 2021-10-07 DIAGNOSIS — R2689 Other abnormalities of gait and mobility: Secondary | ICD-10-CM | POA: Diagnosis not present

## 2021-10-07 DIAGNOSIS — I4819 Other persistent atrial fibrillation: Secondary | ICD-10-CM | POA: Diagnosis not present

## 2021-10-07 DIAGNOSIS — G4733 Obstructive sleep apnea (adult) (pediatric): Secondary | ICD-10-CM | POA: Diagnosis not present

## 2021-10-07 DIAGNOSIS — I480 Paroxysmal atrial fibrillation: Secondary | ICD-10-CM | POA: Diagnosis not present

## 2021-10-11 DIAGNOSIS — I5023 Acute on chronic systolic (congestive) heart failure: Secondary | ICD-10-CM | POA: Diagnosis not present

## 2021-10-11 DIAGNOSIS — Z87891 Personal history of nicotine dependence: Secondary | ICD-10-CM | POA: Diagnosis not present

## 2021-10-11 DIAGNOSIS — Z9989 Dependence on other enabling machines and devices: Secondary | ICD-10-CM | POA: Diagnosis not present

## 2021-10-11 DIAGNOSIS — Z8616 Personal history of COVID-19: Secondary | ICD-10-CM | POA: Diagnosis not present

## 2021-10-11 DIAGNOSIS — I2510 Atherosclerotic heart disease of native coronary artery without angina pectoris: Secondary | ICD-10-CM | POA: Diagnosis not present

## 2021-10-11 DIAGNOSIS — I340 Nonrheumatic mitral (valve) insufficiency: Secondary | ICD-10-CM | POA: Diagnosis not present

## 2021-10-11 DIAGNOSIS — I480 Paroxysmal atrial fibrillation: Secondary | ICD-10-CM | POA: Diagnosis not present

## 2021-10-11 DIAGNOSIS — R5381 Other malaise: Secondary | ICD-10-CM | POA: Diagnosis not present

## 2021-10-11 DIAGNOSIS — E119 Type 2 diabetes mellitus without complications: Secondary | ICD-10-CM | POA: Diagnosis not present

## 2021-10-11 DIAGNOSIS — G4733 Obstructive sleep apnea (adult) (pediatric): Secondary | ICD-10-CM | POA: Diagnosis not present

## 2021-10-11 DIAGNOSIS — R2689 Other abnormalities of gait and mobility: Secondary | ICD-10-CM | POA: Diagnosis not present

## 2021-10-11 DIAGNOSIS — I110 Hypertensive heart disease with heart failure: Secondary | ICD-10-CM | POA: Diagnosis not present

## 2021-10-11 DIAGNOSIS — Z950 Presence of cardiac pacemaker: Secondary | ICD-10-CM | POA: Diagnosis not present

## 2021-10-11 DIAGNOSIS — E785 Hyperlipidemia, unspecified: Secondary | ICD-10-CM | POA: Diagnosis not present

## 2021-10-11 DIAGNOSIS — M170 Bilateral primary osteoarthritis of knee: Secondary | ICD-10-CM | POA: Diagnosis not present

## 2021-10-11 DIAGNOSIS — Z955 Presence of coronary angioplasty implant and graft: Secondary | ICD-10-CM | POA: Diagnosis not present

## 2021-10-11 DIAGNOSIS — Z7901 Long term (current) use of anticoagulants: Secondary | ICD-10-CM | POA: Diagnosis not present

## 2021-10-11 DIAGNOSIS — Z9981 Dependence on supplemental oxygen: Secondary | ICD-10-CM | POA: Diagnosis not present

## 2021-10-12 ENCOUNTER — Other Ambulatory Visit: Payer: Self-pay

## 2021-10-12 DIAGNOSIS — N184 Chronic kidney disease, stage 4 (severe): Secondary | ICD-10-CM

## 2021-10-12 DIAGNOSIS — Z7901 Long term (current) use of anticoagulants: Secondary | ICD-10-CM

## 2021-10-13 ENCOUNTER — Other Ambulatory Visit: Payer: Medicare Other

## 2021-10-13 DIAGNOSIS — Z7901 Long term (current) use of anticoagulants: Secondary | ICD-10-CM | POA: Diagnosis not present

## 2021-10-14 LAB — PROTIME-INR
INR: 5.9 (ref 0.9–1.2)
Prothrombin Time: 56.6 s — ABNORMAL HIGH (ref 9.1–12.0)

## 2021-10-17 ENCOUNTER — Ambulatory Visit: Payer: Medicare Other

## 2021-10-17 DIAGNOSIS — Z7901 Long term (current) use of anticoagulants: Secondary | ICD-10-CM | POA: Diagnosis not present

## 2021-10-18 ENCOUNTER — Other Ambulatory Visit: Payer: Self-pay | Admitting: Legal Medicine

## 2021-10-18 ENCOUNTER — Other Ambulatory Visit: Payer: Self-pay

## 2021-10-18 DIAGNOSIS — I5023 Acute on chronic systolic (congestive) heart failure: Secondary | ICD-10-CM | POA: Diagnosis not present

## 2021-10-18 DIAGNOSIS — I2510 Atherosclerotic heart disease of native coronary artery without angina pectoris: Secondary | ICD-10-CM | POA: Diagnosis not present

## 2021-10-18 DIAGNOSIS — Z955 Presence of coronary angioplasty implant and graft: Secondary | ICD-10-CM | POA: Diagnosis not present

## 2021-10-18 DIAGNOSIS — Z9989 Dependence on other enabling machines and devices: Secondary | ICD-10-CM | POA: Diagnosis not present

## 2021-10-18 DIAGNOSIS — I480 Paroxysmal atrial fibrillation: Secondary | ICD-10-CM | POA: Diagnosis not present

## 2021-10-18 DIAGNOSIS — Z7901 Long term (current) use of anticoagulants: Secondary | ICD-10-CM | POA: Diagnosis not present

## 2021-10-18 DIAGNOSIS — I340 Nonrheumatic mitral (valve) insufficiency: Secondary | ICD-10-CM | POA: Diagnosis not present

## 2021-10-18 DIAGNOSIS — M170 Bilateral primary osteoarthritis of knee: Secondary | ICD-10-CM | POA: Diagnosis not present

## 2021-10-18 DIAGNOSIS — Z8616 Personal history of COVID-19: Secondary | ICD-10-CM | POA: Diagnosis not present

## 2021-10-18 DIAGNOSIS — Z87891 Personal history of nicotine dependence: Secondary | ICD-10-CM | POA: Diagnosis not present

## 2021-10-18 DIAGNOSIS — R5381 Other malaise: Secondary | ICD-10-CM | POA: Diagnosis not present

## 2021-10-18 DIAGNOSIS — I110 Hypertensive heart disease with heart failure: Secondary | ICD-10-CM | POA: Diagnosis not present

## 2021-10-18 DIAGNOSIS — R2689 Other abnormalities of gait and mobility: Secondary | ICD-10-CM | POA: Diagnosis not present

## 2021-10-18 DIAGNOSIS — E785 Hyperlipidemia, unspecified: Secondary | ICD-10-CM | POA: Diagnosis not present

## 2021-10-18 DIAGNOSIS — I4819 Other persistent atrial fibrillation: Secondary | ICD-10-CM

## 2021-10-18 DIAGNOSIS — E119 Type 2 diabetes mellitus without complications: Secondary | ICD-10-CM | POA: Diagnosis not present

## 2021-10-18 DIAGNOSIS — Z9981 Dependence on supplemental oxygen: Secondary | ICD-10-CM | POA: Diagnosis not present

## 2021-10-18 DIAGNOSIS — Z950 Presence of cardiac pacemaker: Secondary | ICD-10-CM | POA: Diagnosis not present

## 2021-10-18 DIAGNOSIS — G4733 Obstructive sleep apnea (adult) (pediatric): Secondary | ICD-10-CM | POA: Diagnosis not present

## 2021-10-18 LAB — PROTIME-INR
INR: 4 — ABNORMAL HIGH (ref 0.9–1.2)
Prothrombin Time: 38.7 s — ABNORMAL HIGH (ref 9.1–12.0)

## 2021-10-18 MED ORDER — FUROSEMIDE 40 MG PO TABS: 60.0000 mg | ORAL_TABLET | Freq: Two times a day (BID) | ORAL | 1 refills | Status: DC

## 2021-10-18 MED ORDER — WARFARIN SODIUM 3 MG PO TABS: 3.0000 mg | ORAL_TABLET | ORAL | 3 refills | Status: DC

## 2021-10-18 NOTE — Progress Notes (Signed)
INR still high, cut to 26m alternating with 3 mg every other day, recheck 4 weeks lp

## 2021-10-27 DIAGNOSIS — Z9989 Dependence on other enabling machines and devices: Secondary | ICD-10-CM | POA: Diagnosis not present

## 2021-10-27 DIAGNOSIS — I340 Nonrheumatic mitral (valve) insufficiency: Secondary | ICD-10-CM | POA: Diagnosis not present

## 2021-10-27 DIAGNOSIS — I5023 Acute on chronic systolic (congestive) heart failure: Secondary | ICD-10-CM | POA: Diagnosis not present

## 2021-10-27 DIAGNOSIS — Z955 Presence of coronary angioplasty implant and graft: Secondary | ICD-10-CM | POA: Diagnosis not present

## 2021-10-27 DIAGNOSIS — Z87891 Personal history of nicotine dependence: Secondary | ICD-10-CM | POA: Diagnosis not present

## 2021-10-27 DIAGNOSIS — Z950 Presence of cardiac pacemaker: Secondary | ICD-10-CM | POA: Diagnosis not present

## 2021-10-27 DIAGNOSIS — Z7901 Long term (current) use of anticoagulants: Secondary | ICD-10-CM | POA: Diagnosis not present

## 2021-10-27 DIAGNOSIS — Z992 Dependence on renal dialysis: Secondary | ICD-10-CM | POA: Diagnosis not present

## 2021-10-27 DIAGNOSIS — G4733 Obstructive sleep apnea (adult) (pediatric): Secondary | ICD-10-CM | POA: Diagnosis not present

## 2021-10-27 DIAGNOSIS — E785 Hyperlipidemia, unspecified: Secondary | ICD-10-CM | POA: Diagnosis not present

## 2021-10-27 DIAGNOSIS — E119 Type 2 diabetes mellitus without complications: Secondary | ICD-10-CM | POA: Diagnosis not present

## 2021-10-27 DIAGNOSIS — I2510 Atherosclerotic heart disease of native coronary artery without angina pectoris: Secondary | ICD-10-CM | POA: Diagnosis not present

## 2021-10-27 DIAGNOSIS — Z8616 Personal history of COVID-19: Secondary | ICD-10-CM | POA: Diagnosis not present

## 2021-10-27 DIAGNOSIS — M170 Bilateral primary osteoarthritis of knee: Secondary | ICD-10-CM | POA: Diagnosis not present

## 2021-10-27 DIAGNOSIS — I252 Old myocardial infarction: Secondary | ICD-10-CM | POA: Diagnosis not present

## 2021-10-27 DIAGNOSIS — I480 Paroxysmal atrial fibrillation: Secondary | ICD-10-CM | POA: Diagnosis not present

## 2021-10-27 DIAGNOSIS — Z9981 Dependence on supplemental oxygen: Secondary | ICD-10-CM | POA: Diagnosis not present

## 2021-10-27 DIAGNOSIS — R2689 Other abnormalities of gait and mobility: Secondary | ICD-10-CM | POA: Diagnosis not present

## 2021-10-27 DIAGNOSIS — R001 Bradycardia, unspecified: Secondary | ICD-10-CM | POA: Diagnosis not present

## 2021-10-27 DIAGNOSIS — R5381 Other malaise: Secondary | ICD-10-CM | POA: Diagnosis not present

## 2021-10-27 DIAGNOSIS — I110 Hypertensive heart disease with heart failure: Secondary | ICD-10-CM | POA: Diagnosis not present

## 2021-10-31 DIAGNOSIS — Z4509 Encounter for adjustment and management of other cardiac device: Secondary | ICD-10-CM | POA: Diagnosis not present

## 2021-11-03 ENCOUNTER — Ambulatory Visit (INDEPENDENT_AMBULATORY_CARE_PROVIDER_SITE_OTHER): Payer: Medicare Other

## 2021-11-03 VITALS — BP 112/67 | HR 69 | Wt 272.0 lb

## 2021-11-03 DIAGNOSIS — I10 Essential (primary) hypertension: Secondary | ICD-10-CM

## 2021-11-03 DIAGNOSIS — N184 Chronic kidney disease, stage 4 (severe): Secondary | ICD-10-CM

## 2021-11-03 DIAGNOSIS — E1151 Type 2 diabetes mellitus with diabetic peripheral angiopathy without gangrene: Secondary | ICD-10-CM

## 2021-11-03 DIAGNOSIS — J9611 Chronic respiratory failure with hypoxia: Secondary | ICD-10-CM | POA: Diagnosis not present

## 2021-11-03 DIAGNOSIS — Z7901 Long term (current) use of anticoagulants: Secondary | ICD-10-CM

## 2021-11-03 NOTE — Patient Instructions (Signed)
Visit Information  Thank you for taking time to visit with me today. Please don't hesitate to contact me if I can be of assistance to you before our next scheduled telephone appointment.  Following are the goals we discussed today:  Follow up with PCP as planned    If you are experiencing a Mental Health or [place] or need someone to talk to, please call the Suicide and Crisis Lifeline: 988 call the [place] National Suicide Prevention Lifeline: 320-460-6238 or TTY: 581-711-7633 TTY (843)706-6236) to talk to a trained counselor call 1-800-273-TALK (toll free, 24 hour hotline) call 911   The patient verbalized understanding of instructions, educational materials, and care plan provided today and DECLINED offer to receive copy of patient instructions, educational materials, and care plan.  Tomasa Rand RN, BSN, CEN RN Case Freight forwarder - [employer] exhibitions officer Mobile: [phone]

## 2021-11-03 NOTE — Chronic Care Management (AMB) (Signed)
Chronic Care Management   CCM RN Visit Note  11/03/2021 Name: Steven Hester MRN: [id number] DOB: 24-Jul-1945  Subjective: Steven Hester is a 76 y.o. year old male who is a primary care patient of Lillard Anes, MD. The care management team was consulted for assistance with disease management and care coordination needs.    Engaged with patient by telephone for follow up visit in response to provider referral for case management and/or care coordination services.   Consent to Services:  The patient was given information about Chronic Care Management services, agreed to services, and gave verbal consent prior to initiation of services.  Please see initial visit note for detailed documentation.   Patient agreed to services and verbal consent obtained.   Assessment: Review of patient past medical history, allergies, medications, health status, including review of consultants reports, laboratory and other test data, was performed as part of comprehensive evaluation and provision of chronic care management services.   SDOH (Social Determinants of Health) assessments and interventions performed:    CCM Care Plan  Allergies  Allergen Reactions   Ezetimibe Hives    Zetia    Simvastatin Other (See Comments)    Other reaction(s): Myalgias (intolerance)   Niacin Rash    Outpatient Encounter Medications as of 11/03/2021  Medication Sig Note   ACCU-CHEK GUIDE test strip USE 1  TWICE DAILY    acetaminophen-codeine (TYLENOL #3) 300-30 MG tablet Take 2 tablets by mouth every 4 (four) hours as needed for moderate pain.    amiodarone (PACERONE) 200 MG tablet Take 200 mg by mouth daily.    atorvastatin (LIPITOR) 80 MG tablet Take 80 mg by mouth daily.    carvedilol (COREG) 3.125 MG tablet Take 3.125 mg by mouth 2 (two) times daily.    ferrous sulfate 325 (65 FE) MG tablet 1 tablet 2 TIMES DAILY (route: oral) 09/28/2021: Med Classification: Electrolyte Balance-Nutritional Products    furosemide (LASIX) 40 MG tablet Take 1.5 tablets (60 mg total) by mouth 2 (two) times daily.    isosorbide mononitrate (IMDUR) 30 MG 24 hr tablet Take 30 mg by mouth daily. (Patient not taking: Reported on 09/20/2021)    nitroGLYCERIN (NITROSTAT) 0.4 MG SL tablet Place under the tongue.    potassium chloride SA (KLOR-CON M) 20 MEQ tablet Take 1 tablet by mouth daily. 09/20/2021: Med Classification: Electrolyte Balance-Nutritional Products   Sennosides (LAXATIVE) 25 MG TABS Take 25 mg by mouth daily.    spironolactone (ALDACTONE) 25 MG tablet Take 1 tablet (25 mg total) by mouth daily.    warfarin (COUMADIN) 3 MG tablet Take 1 tablet (3 mg total) by mouth every other day. Use 81m coumadin the other days    warfarin (COUMADIN) 4 MG tablet Take 1 tablet (4 mg total) by mouth daily.    zolpidem (AMBIEN) 5 MG tablet Take 1 tablet (5 mg total) by mouth at bedtime as needed for sleep.    No facility-administered encounter medications on file as of 11/03/2021.    Patient Active Problem List   Diagnosis Date Noted   Acute on chronic combined systolic and diastolic CHF (congestive heart failure) ([place] 09/28/2021   CKD (chronic kidney disease) stage 5, GFR less than 15 ml/min ([place] 09/20/2021   Angina pectoris, unspecified ([facility] 09/20/2021   S/P mitral valve clip implantation 044/96/7591  Chronic systolic congestive heart failure ([place] 05/01/2021   Cardiorenal syndrome, stage 1-4 or unspecified chronic kidney disease, with heart failure ([facility] [facility] [place] 05/01/2021   Hypokalemia 05/01/2021  Acquired thrombophilia ([place]) 12/29/2020   Pleural effusion 12/14/2020   Nonrheumatic mitral valve regurgitation 12/02/2020   Warfarin anticoagulation 11/19/2020   HFrEF (heart failure with reduced ejection fraction) ([place]) 10/20/2020   Mild protein-calorie malnutrition ([place]) 09/07/2020   Stage 4 chronic kidney disease ([place]) 09/01/2020   Acute respiratory failure with hypoxia ([place]) 08/28/2020   Calculus of gallbladder with  other cholecystitis without obstruction 08/28/2020   Obstructive sleep apnea 08/28/2020   Acute kidney failure, unspecified ([place]) 08/17/2020   Allergy, unspecified, initial encounter 08/17/2020   Anemia due to stage 4 chronic kidney disease ([place]) 08/17/2020   Ischemic dilated cardiomyopathy ([place]) 08/17/2020   Iron deficiency anemia, unspecified 08/17/2020   Secondary hyperparathyroidism of renal origin ([place]) 08/17/2020   Acute embolism and thrombosis of unspecified deep veins of unspecified lower extremity ([place]) 07/21/2020   Cardiomyopathy, unspecified ([place]) 07/21/2020   Other complete intestinal obstruction ([place]) 07/21/2020   Perforation of intestine (nontraumatic) ([place]) 07/21/2020   BMI 38.0-38.9,adult 12/29/2019   Morbid obesity ([place]) 12/29/2019   Other primary thrombocytopenia ([place]) 08/27/2019   Diabetes mellitus type 2 with peripheral artery disease ([place])    Mixed hyperlipidemia    Essential hypertension    Presence of cardiac pacemaker - placed 07-29-2019. medtronic. 07/29/2019   Bradycardia 05/16/2019   Malaise and fatigue 05/16/2019   Chronic hypoxemic respiratory failure ([place]) 02/17/2019   Current use of long term anticoagulation 06/27/2017   Swelling of both ankles 06/27/2017   Persistent atrial fibrillation ([place]) 01/16/2017   CAD in native artery 12/27/2016   H/O heart artery stent 12/27/2016   RBBB (right bundle branch block with left anterior fascicular block) 12/27/2016    Conditions to be addressed/monitored:CHF, HTN, and DMII  Care Plan : RN Care Manager Plan of Care  Updates made by Thana Ates, RN since 11/03/2021 12:00 AM     Problem: No plan of care established for mangement of chronic disease states ( Hypertension, Dm, heart failure)   Priority: High     Long-Range Goal: Development of plan of care for chronic disease management ( Hypertension, DM, heart failure)   Start Date: 05/03/2021  Expected End Date: 05/03/2022  Priority: High  Note:   Current  Barriers:  Chronic Disease Management support and education needs related to CHF, HTN, and DMII 05/03/2021  Discharged home from hospital last night with CHF. Diuresed  2.7 liters. Patient reports weight today of 271 pounds.  Reports he feels good. No swelling today. CBG today of 96.  Reports he is going to get a BP monitor from [facility] after the new year.  Reports understanding of his discharge medications and instructions. Reports he is aware of his follow up appointments scheduled for 05/04/2021.  06/02/2021- Patient reports that he was up all night going to the bathroom to pee. Reports that he took a fluid pill.  Heart failure- Reports scale is not working, states he needs to check the battery.  Reports no swelling today. States that he is short of breath at times and uses his oxygen as needed. Reports that he is using his CPAP machine nightly.  No pain today.  Hypertension- Reports that he has ordered his BP monitor and is waiting for it to come in. Reports taking all medications as prescribed.  Denies any new problems or concerns. Reports that he is following a low salt diet. States that he is having a "Heart Procedure on 06/07/2021 in [facility] to check his leaking heart valves".  Covid- Reports that he has recovered  from Covid.   DM- reports CBG 95-105.        Patient reports that he is doing really well. CHF: Continues to weigh daily. Has lost 11 pounds. Reports that he is following his diet and taking his medications as prescribed. Reports that he has follow up planned with the nephrologist next week. Reports that he is almost out of his lasix but has enough until he sees the doctor.  States that he continues to wear his compression hose daily. DM: reports CBG range of 91-105.  Reports today CBG of 96.  States that he is taking his medications as prescribed.  HTN: Reports good blood pressures and self monitors daily.    Patient with questions about his next blood draw. Reviewed Dr. Blanch Media note and it look  like patient needs INR on 11/14/2021.                                                                                    RNCM Clinical Goal(s):  Patient will verbalize basic understanding of CHF, HTN, and DMII disease process and self health management plan as evidenced by patient report demonstrate improved adherence to prescribed treatment plan for CHF, HTN, and DMII as evidenced by reviewed of EMR and patient report  through collaboration with RN Care manager, provider, and care team.   Interventions: 1:1 collaboration with primary care provider regarding development and update of comprehensive plan of care as evidenced by provider attestation and co-signature Inter-disciplinary care team collaboration (see longitudinal plan of care) Evaluation of current treatment plan related to  self management and patient's adherence to plan as established by provider   Heart Failure Interventions:  (Status: Goal Met.)  Long Term Goal   Weight: 272 lb (123.4 kg) Provided education on low sodium diet Assessed need for readable accurate scales in home Provided education about placing scale on hard, flat surface Advised patient to weigh each morning after emptying bladder Discussed importance of daily weight and advised patient to weigh and record daily Reviewed role of diuretics in prevention of fluid overload and management of heart failure Discussed the importance of keeping all appointments with provider Goal Met  Diabetes:  (Status: Goal Met.) Long Term Goal   Lab Results  Component Value Date   HGBA1C 6.2 (H) 08/09/2021  Assessed patient's understanding of A1c goal: <6.5% Reviewed medications with patient and discussed importance of medication adherence;        Counseled on importance of regular laboratory monitoring as prescribed;        Discussed plans with patient for ongoing care management follow up and provided patient with direct contact information for care management team;       Reviewed scheduled/upcoming provider appointments including: PCP and cardiology;         Advised patient, providing education and rationale, to check cbg once daily and when you have symptoms of low or high blood sugar and record        Review of patient status, including review of consultants reports, relevant laboratory and other test results, and medications completed;       Reviewed most recent A1c and congratulated patient on his success.  Hypertension: (  Status: Goal Met.) Long Term Goal  Last practice recorded BP readings:  BP Readings from Last 3 Encounters:  11/03/21 112/67  10/05/21 112/80  09/28/21 100/64  Most recent eGFR/CrCl:  Lab Results  Component Value Date   EGFR 21 (L) 10/05/2021      Provided education to patient re: stroke prevention, s/s of heart attack and stroke; Reviewed prescribed diet low salt diet Reviewed medications with patient and discussed importance of compliance;  Goal Met. Self monitoring   Patient Goals/Self-Care Activities: Take medications as prescribed   Attend all scheduled provider appointments Call pharmacy for medication refills 3-7 days in advance of running out of medications Call provider office for new concerns or questions  call office if I gain more than 2 pounds in one day or 5 pounds in one week do ankle pumps when sitting keep legs up while sitting watch for swelling in feet, ankles and legs every day weigh myself daily follow rescue plan if symptoms flare-up take the blood sugar log to all doctor visits take the blood sugar meter to all doctor visits check blood pressure daily when I get a machine choose a place to take my blood pressure (home, clinic or office, retail store) write blood pressure results in a log or diary keep a blood pressure log take blood pressure log to all doctor appointments call doctor for signs and symptoms of high blood pressure keep all doctor appointments take medications for blood pressure  exactly as prescribed report new symptoms to your doctor          Plan:No further follow up required: with nurse case manager. Goals met. Continue to follow up with MD. Tomasa Rand RN, BSN, CEN RN Case Manager - [facility] [facility] Mobile: [phone]

## 2021-11-04 DIAGNOSIS — I110 Hypertensive heart disease with heart failure: Secondary | ICD-10-CM | POA: Diagnosis not present

## 2021-11-04 DIAGNOSIS — J9611 Chronic respiratory failure with hypoxia: Secondary | ICD-10-CM | POA: Diagnosis not present

## 2021-11-04 DIAGNOSIS — I509 Heart failure, unspecified: Secondary | ICD-10-CM | POA: Diagnosis not present

## 2021-11-04 DIAGNOSIS — E1159 Type 2 diabetes mellitus with other circulatory complications: Secondary | ICD-10-CM | POA: Diagnosis not present

## 2021-11-07 ENCOUNTER — Other Ambulatory Visit: Payer: Medicare Other

## 2021-11-09 DIAGNOSIS — Z7901 Long term (current) use of anticoagulants: Secondary | ICD-10-CM | POA: Diagnosis not present

## 2021-11-09 DIAGNOSIS — E1122 Type 2 diabetes mellitus with diabetic chronic kidney disease: Secondary | ICD-10-CM | POA: Diagnosis not present

## 2021-11-09 DIAGNOSIS — I4891 Unspecified atrial fibrillation: Secondary | ICD-10-CM | POA: Diagnosis not present

## 2021-11-09 DIAGNOSIS — I130 Hypertensive heart and chronic kidney disease with heart failure and stage 1 through stage 4 chronic kidney disease, or unspecified chronic kidney disease: Secondary | ICD-10-CM | POA: Diagnosis not present

## 2021-11-09 DIAGNOSIS — I5022 Chronic systolic (congestive) heart failure: Secondary | ICD-10-CM | POA: Diagnosis not present

## 2021-11-09 DIAGNOSIS — I252 Old myocardial infarction: Secondary | ICD-10-CM | POA: Diagnosis not present

## 2021-11-09 DIAGNOSIS — Z950 Presence of cardiac pacemaker: Secondary | ICD-10-CM | POA: Diagnosis not present

## 2021-11-09 DIAGNOSIS — I452 Bifascicular block: Secondary | ICD-10-CM | POA: Diagnosis not present

## 2021-11-09 DIAGNOSIS — N2581 Secondary hyperparathyroidism of renal origin: Secondary | ICD-10-CM | POA: Diagnosis not present

## 2021-11-09 DIAGNOSIS — D649 Anemia, unspecified: Secondary | ICD-10-CM | POA: Diagnosis not present

## 2021-11-09 DIAGNOSIS — I129 Hypertensive chronic kidney disease with stage 1 through stage 4 chronic kidney disease, or unspecified chronic kidney disease: Secondary | ICD-10-CM | POA: Diagnosis not present

## 2021-11-09 DIAGNOSIS — I4819 Other persistent atrial fibrillation: Secondary | ICD-10-CM | POA: Diagnosis not present

## 2021-11-09 DIAGNOSIS — I5020 Unspecified systolic (congestive) heart failure: Secondary | ICD-10-CM | POA: Diagnosis not present

## 2021-11-09 DIAGNOSIS — I255 Ischemic cardiomyopathy: Secondary | ICD-10-CM | POA: Diagnosis not present

## 2021-11-09 DIAGNOSIS — Z955 Presence of coronary angioplasty implant and graft: Secondary | ICD-10-CM | POA: Diagnosis not present

## 2021-11-09 DIAGNOSIS — I2510 Atherosclerotic heart disease of native coronary artery without angina pectoris: Secondary | ICD-10-CM | POA: Diagnosis not present

## 2021-11-09 DIAGNOSIS — E785 Hyperlipidemia, unspecified: Secondary | ICD-10-CM | POA: Diagnosis not present

## 2021-11-09 DIAGNOSIS — N184 Chronic kidney disease, stage 4 (severe): Secondary | ICD-10-CM | POA: Diagnosis not present

## 2021-11-10 DIAGNOSIS — I110 Hypertensive heart disease with heart failure: Secondary | ICD-10-CM | POA: Diagnosis not present

## 2021-11-10 DIAGNOSIS — E119 Type 2 diabetes mellitus without complications: Secondary | ICD-10-CM | POA: Diagnosis not present

## 2021-11-10 DIAGNOSIS — Z8616 Personal history of COVID-19: Secondary | ICD-10-CM | POA: Diagnosis not present

## 2021-11-10 DIAGNOSIS — I340 Nonrheumatic mitral (valve) insufficiency: Secondary | ICD-10-CM | POA: Diagnosis not present

## 2021-11-10 DIAGNOSIS — G4733 Obstructive sleep apnea (adult) (pediatric): Secondary | ICD-10-CM | POA: Diagnosis not present

## 2021-11-10 DIAGNOSIS — I5023 Acute on chronic systolic (congestive) heart failure: Secondary | ICD-10-CM | POA: Diagnosis not present

## 2021-11-10 DIAGNOSIS — R5381 Other malaise: Secondary | ICD-10-CM | POA: Diagnosis not present

## 2021-11-10 DIAGNOSIS — M170 Bilateral primary osteoarthritis of knee: Secondary | ICD-10-CM | POA: Diagnosis not present

## 2021-11-10 DIAGNOSIS — R2689 Other abnormalities of gait and mobility: Secondary | ICD-10-CM | POA: Diagnosis not present

## 2021-11-10 DIAGNOSIS — Z7901 Long term (current) use of anticoagulants: Secondary | ICD-10-CM | POA: Diagnosis not present

## 2021-11-10 DIAGNOSIS — Z955 Presence of coronary angioplasty implant and graft: Secondary | ICD-10-CM | POA: Diagnosis not present

## 2021-11-10 DIAGNOSIS — Z9989 Dependence on other enabling machines and devices: Secondary | ICD-10-CM | POA: Diagnosis not present

## 2021-11-10 DIAGNOSIS — I480 Paroxysmal atrial fibrillation: Secondary | ICD-10-CM | POA: Diagnosis not present

## 2021-11-10 DIAGNOSIS — I2510 Atherosclerotic heart disease of native coronary artery without angina pectoris: Secondary | ICD-10-CM | POA: Diagnosis not present

## 2021-11-10 DIAGNOSIS — E785 Hyperlipidemia, unspecified: Secondary | ICD-10-CM | POA: Diagnosis not present

## 2021-11-10 DIAGNOSIS — Z950 Presence of cardiac pacemaker: Secondary | ICD-10-CM | POA: Diagnosis not present

## 2021-11-10 DIAGNOSIS — Z9981 Dependence on supplemental oxygen: Secondary | ICD-10-CM | POA: Diagnosis not present

## 2021-11-10 DIAGNOSIS — Z87891 Personal history of nicotine dependence: Secondary | ICD-10-CM | POA: Diagnosis not present

## 2021-11-11 ENCOUNTER — Other Ambulatory Visit: Payer: Self-pay

## 2021-11-11 MED ORDER — FERROUS SULFATE 325 (65 FE) MG PO TABS: 325.0000 mg | ORAL_TABLET | Freq: Two times a day (BID) | ORAL | 2 refills | Status: AC

## 2021-11-14 ENCOUNTER — Other Ambulatory Visit: Payer: Medicare Other

## 2021-11-14 DIAGNOSIS — Z7901 Long term (current) use of anticoagulants: Secondary | ICD-10-CM | POA: Diagnosis not present

## 2021-11-15 LAB — PROTIME-INR
INR: 6.1 (ref 0.9–1.2)
Prothrombin Time: 58.7 s — ABNORMAL HIGH (ref 9.1–12.0)

## 2021-11-15 NOTE — Progress Notes (Signed)
Check protime in 3 days, take '3mg'$  qd recheck again in 2 weeks avoid greens lp

## 2021-11-18 ENCOUNTER — Other Ambulatory Visit: Payer: Medicare Other

## 2021-11-18 DIAGNOSIS — Z7901 Long term (current) use of anticoagulants: Secondary | ICD-10-CM

## 2021-11-19 LAB — PROTIME-INR
INR: 3.8 — ABNORMAL HIGH (ref 0.9–1.2)
Prothrombin Time: 37.5 s — ABNORMAL HIGH (ref 9.1–12.0)

## 2021-11-21 ENCOUNTER — Other Ambulatory Visit: Payer: Self-pay

## 2021-11-21 ENCOUNTER — Other Ambulatory Visit: Payer: Medicare Other

## 2021-11-21 DIAGNOSIS — Z7901 Long term (current) use of anticoagulants: Secondary | ICD-10-CM | POA: Diagnosis not present

## 2021-11-22 LAB — PROTIME-INR
INR: 2 — ABNORMAL HIGH (ref 0.9–1.2)
Prothrombin Time: 20.3 s — ABNORMAL HIGH (ref 9.1–12.0)

## 2021-11-23 DIAGNOSIS — G4733 Obstructive sleep apnea (adult) (pediatric): Secondary | ICD-10-CM | POA: Diagnosis not present

## 2021-11-29 ENCOUNTER — Other Ambulatory Visit: Payer: Medicare Other

## 2021-11-29 ENCOUNTER — Other Ambulatory Visit: Payer: Self-pay | Admitting: Family Medicine

## 2021-11-29 DIAGNOSIS — Z7901 Long term (current) use of anticoagulants: Secondary | ICD-10-CM | POA: Diagnosis not present

## 2021-11-30 LAB — PROTIME-INR
INR: 3.5 — ABNORMAL HIGH (ref 0.9–1.2)
Prothrombin Time: 34.5 s — ABNORMAL HIGH (ref 9.1–12.0)

## 2021-12-01 ENCOUNTER — Other Ambulatory Visit: Payer: Self-pay

## 2021-12-01 DIAGNOSIS — Z7901 Long term (current) use of anticoagulants: Secondary | ICD-10-CM

## 2021-12-02 ENCOUNTER — Telehealth: Payer: Self-pay

## 2021-12-02 NOTE — Telephone Encounter (Signed)
Patient called stating that he received a call yesterday with instructions for his Coumadin dose however he can't remember how it was told to take it.   I explained to him and his wife that he needs to take 3 mg daily on Monday, Wednesday, Friday, Sunday. Take 1/2 pill Tuesday, Thursday, Saturday.  He and his wife went over this with me three times writing it down confirming it is correct.    Repeat PT/INR next Thursday, patient is aware.

## 2021-12-03 DIAGNOSIS — J9611 Chronic respiratory failure with hypoxia: Secondary | ICD-10-CM | POA: Diagnosis not present

## 2021-12-05 ENCOUNTER — Other Ambulatory Visit: Payer: Self-pay

## 2021-12-05 DIAGNOSIS — I4819 Other persistent atrial fibrillation: Secondary | ICD-10-CM

## 2021-12-05 DIAGNOSIS — J9611 Chronic respiratory failure with hypoxia: Secondary | ICD-10-CM | POA: Diagnosis not present

## 2021-12-05 MED ORDER — WARFARIN SODIUM 3 MG PO TABS: ORAL_TABLET | ORAL | 0 refills | Status: DC

## 2021-12-08 ENCOUNTER — Other Ambulatory Visit: Payer: Self-pay | Admitting: Family Medicine

## 2021-12-08 ENCOUNTER — Other Ambulatory Visit: Payer: Medicare Other

## 2021-12-08 DIAGNOSIS — Z7901 Long term (current) use of anticoagulants: Secondary | ICD-10-CM | POA: Diagnosis not present

## 2021-12-09 LAB — PROTIME-INR
INR: 3.2 — ABNORMAL HIGH (ref 0.9–1.2)
Prothrombin Time: 32.4 s — ABNORMAL HIGH (ref 9.1–12.0)

## 2021-12-12 ENCOUNTER — Ambulatory Visit: Payer: Medicare Other | Admitting: Nurse Practitioner

## 2021-12-14 ENCOUNTER — Other Ambulatory Visit: Payer: Self-pay | Admitting: Family Medicine

## 2021-12-14 DIAGNOSIS — Z7901 Long term (current) use of anticoagulants: Secondary | ICD-10-CM | POA: Diagnosis not present

## 2021-12-15 ENCOUNTER — Ambulatory Visit: Payer: Medicare Other

## 2021-12-15 LAB — PROTIME-INR
INR: 2.9 — ABNORMAL HIGH (ref 0.9–1.2)
Prothrombin Time: 29.2 s — ABNORMAL HIGH (ref 9.1–12.0)

## 2021-12-21 ENCOUNTER — Other Ambulatory Visit: Payer: Self-pay

## 2021-12-21 DIAGNOSIS — I5023 Acute on chronic systolic (congestive) heart failure: Secondary | ICD-10-CM

## 2021-12-21 MED ORDER — SPIRONOLACTONE 25 MG PO TABS: 25.0000 mg | ORAL_TABLET | Freq: Every day | ORAL | 3 refills | Status: AC

## 2021-12-28 ENCOUNTER — Telehealth: Payer: Self-pay

## 2021-12-28 NOTE — Telephone Encounter (Signed)
Patient calling as cpap machine broke a couple days ago. He has not contacted company as he is unsure where he received it from.   Please advise.   Steven Hester, [place] 12/28/21 9:14 AM

## 2022-01-02 NOTE — Telephone Encounter (Signed)
Patient called today to check on status of his new CPAP.  He states he doesn't know the DME Company it came from or how long he has had it.  It looks like his oxygen concentrator came from [facility] - I have reached out to them via telephone to see if they supplied his CPAP as well however hung up after >20 minutes on hold.  I will try again later.

## 2022-01-03 DIAGNOSIS — J9611 Chronic respiratory failure with hypoxia: Secondary | ICD-10-CM | POA: Diagnosis not present

## 2022-01-04 DIAGNOSIS — E119 Type 2 diabetes mellitus without complications: Secondary | ICD-10-CM | POA: Diagnosis not present

## 2022-01-05 DIAGNOSIS — J9611 Chronic respiratory failure with hypoxia: Secondary | ICD-10-CM | POA: Diagnosis not present

## 2022-01-05 NOTE — Telephone Encounter (Signed)
I contacted [facility] patient.They put a tag urgent to check in his CPAP. They will call him to mobile phone number.

## 2022-01-05 NOTE — Telephone Encounter (Signed)
[facility] Patient called back and they need a new order of CPAP and also doctor's note saying if patient is doing good with CPAP. They will reach patient to let him know. I will fax the order to [facility]

## 2022-01-11 ENCOUNTER — Other Ambulatory Visit: Payer: Self-pay | Admitting: Nurse Practitioner

## 2022-01-13 ENCOUNTER — Other Ambulatory Visit: Payer: Medicare Other

## 2022-01-13 DIAGNOSIS — Z7901 Long term (current) use of anticoagulants: Secondary | ICD-10-CM

## 2022-01-14 LAB — PROTIME-INR
INR: 2.6 — ABNORMAL HIGH (ref 0.9–1.2)
Prothrombin Time: 26.6 s — ABNORMAL HIGH (ref 9.1–12.0)

## 2022-01-15 ENCOUNTER — Other Ambulatory Visit: Payer: Self-pay | Admitting: Family Medicine

## 2022-01-15 DIAGNOSIS — I4819 Other persistent atrial fibrillation: Secondary | ICD-10-CM

## 2022-01-16 ENCOUNTER — Other Ambulatory Visit: Payer: Self-pay

## 2022-01-16 ENCOUNTER — Ambulatory Visit: Payer: Medicare Other | Admitting: Nurse Practitioner

## 2022-01-16 DIAGNOSIS — I4819 Other persistent atrial fibrillation: Secondary | ICD-10-CM

## 2022-01-16 MED ORDER — WARFARIN SODIUM 3 MG PO TABS: ORAL_TABLET | ORAL | 0 refills | Status: AC

## 2022-01-18 ENCOUNTER — Encounter: Payer: Self-pay | Admitting: Legal Medicine

## 2022-01-18 ENCOUNTER — Ambulatory Visit (INDEPENDENT_AMBULATORY_CARE_PROVIDER_SITE_OTHER): Payer: Medicare Other | Admitting: Legal Medicine

## 2022-01-18 VITALS — BP 110/70 | HR 72 | Temp 97.6°F | Resp 16 | Ht 73.0 in | Wt 286.0 lb

## 2022-01-18 DIAGNOSIS — H9123 Sudden idiopathic hearing loss, bilateral: Secondary | ICD-10-CM

## 2022-01-18 DIAGNOSIS — J9611 Chronic respiratory failure with hypoxia: Secondary | ICD-10-CM

## 2022-01-18 DIAGNOSIS — C44619 Basal cell carcinoma of skin of left upper limb, including shoulder: Secondary | ICD-10-CM | POA: Diagnosis not present

## 2022-01-18 DIAGNOSIS — Z9981 Dependence on supplemental oxygen: Secondary | ICD-10-CM | POA: Diagnosis not present

## 2022-01-18 DIAGNOSIS — G4733 Obstructive sleep apnea (adult) (pediatric): Secondary | ICD-10-CM

## 2022-01-18 DIAGNOSIS — F5101 Primary insomnia: Secondary | ICD-10-CM | POA: Diagnosis not present

## 2022-01-18 DIAGNOSIS — H9190 Unspecified hearing loss, unspecified ear: Secondary | ICD-10-CM | POA: Insufficient documentation

## 2022-01-18 DIAGNOSIS — C4491 Basal cell carcinoma of skin, unspecified: Secondary | ICD-10-CM | POA: Insufficient documentation

## 2022-01-18 MED ORDER — ZOLPIDEM TARTRATE 5 MG PO TABS: 5.0000 mg | ORAL_TABLET | Freq: Every evening | ORAL | 3 refills | Status: AC | PRN

## 2022-01-18 NOTE — Progress Notes (Signed)
Subjective:  Patient ID: Steven Hester, male    DOB: 1945-09-26  Age: 76 y.o. MRN: [id number]  Chief Complaint  Patient presents with   Sleep Apnea   Insomnia    HPI Patient is using CPAP auto-titrating at night time with pressure 5-10 cmH2O. He has been using CPAP for 5 years and he needs a new machine. COPD for > 20 years, he had CPAP for 13 years, his CPAP broke and he needs  new one.  He is doing poorly without it.  He  having better O2 sats and energy with CPAP. He is still on O2 2L/minute.  He also is taking Zolpidem 5 mg at bedtime as needed. He mentioned that is helping.    Current Outpatient Medications on File Prior to Visit  Medication Sig Dispense Refill   ACCU-CHEK GUIDE test strip USE 1  TWICE DAILY 100 each 0   acetaminophen-codeine (TYLENOL #3) 300-30 MG tablet Take 2 tablets by mouth every 4 (four) hours as needed for moderate pain. 60 tablet 3   amiodarone (PACERONE) 200 MG tablet Take 200 mg by mouth daily.     atorvastatin (LIPITOR) 80 MG tablet Take 80 mg by mouth daily.     carvedilol (COREG) 3.125 MG tablet Take 3.125 mg by mouth 2 (two) times daily.     ferrous sulfate 325 (65 FE) MG tablet Take 1 tablet (325 mg total) by mouth 2 (two) times daily with a meal. 60 tablet 2   furosemide (LASIX) 40 MG tablet Take 1.5 tablets (60 mg total) by mouth 2 (two) times daily. 60 tablet 1   isosorbide mononitrate (IMDUR) 30 MG 24 hr tablet Take 30 mg by mouth daily.     nitroGLYCERIN (NITROSTAT) 0.4 MG SL tablet Place under the tongue.     OXYGEN Inhale 2 L into the lungs daily.     potassium chloride SA (KLOR-CON M) 20 MEQ tablet Take 1 tablet by mouth daily.     Respiratory Therapy Supplies (CARETOUCH CPAP & BIPAP HOSE) MISC by Does not apply route. 5-10 H2O pressure     Sennosides (LAXATIVE) 25 MG TABS Take 25 mg by mouth daily.     spironolactone (ALDACTONE) 25 MG tablet Take 1 tablet (25 mg total) by mouth daily. 30 tablet 3   warfarin (COUMADIN) 3 MG tablet  TAKE 1 TABLET BY MOUTH MONDAY, WEDNESDAY, FRIDAY, AND SUNDAY. AND THEN TAKE 1/2 TABLET BY MOUTH TUESDAY, THURSDAY, AND SATURDAY 30 tablet 0   warfarin (COUMADIN) 4 MG tablet Take 1 tablet (4 mg total) by mouth daily. 30 tablet 0   warfarin (COUMADIN) 5 MG tablet Take by mouth.     No current facility-administered medications on file prior to visit.   Past Medical History:  Diagnosis Date   CAD (coronary artery disease)    DES to RCA, circumflex, OM in [place]   CHF (congestive heart failure) (HCC)    CKD (chronic kidney disease) stage 4, GFR 15-29 ml/min (HCC)    Essential hypertension    Inferior myocardial infarction [facility])    Ischemic cardiomyopathy    Mitral regurgitation    Mixed hyperlipidemia    Pacemaker    Medtronic   Persistent atrial fibrillation (HCC)    Sinus node dysfunction (HCC)    Type 2 diabetes mellitus ([place])    Past Surgical History:  Procedure Laterality Date   COLON SURGERY     CORONARY ANGIOPLASTY WITH STENT PLACEMENT     PACEMAKER INSERTION  Family History  Problem Relation Age of Onset   Heart attack Mother    GI Disease Father    Cancer Father    Social History   Socioeconomic History   Marital status: Married    Spouse name: Silva Bandy   Number of children: 1   Years of education: Not on file   Highest education level: Not on file  Occupational History   Not on file  Tobacco Use   Smoking status: Former    Packs/day: 1.00    Years: 10.00    Total pack years: 10.00    Types: Cigarettes   Smokeless tobacco: Never  Vaping Use   Vaping Use: Never used  Substance and Sexual Activity   Alcohol use: Yes    Comment: occasionally   Drug use: Never   Sexual activity: Not Currently    Partners: Female  Other Topics Concern   Not on file  Social History Narrative   Not on file   Social Determinants of Health   Financial Resource Strain: Low Risk  (05/04/2020)   Overall Financial Resource Strain (CARDIA)    Difficulty  of Paying Living Expenses: Not hard at all  Food Insecurity: No Food Insecurity (05/05/2021)   Hunger Vital Sign    Worried About Running Out of Food in the Last Year: Never true    [place] in the Last Year: Never true  Transportation Needs: No Transportation Needs (05/05/2021)   PRAPARE - Hydrologist (Medical): No    Lack of Transportation (Non-Medical): No  Physical Activity: Insufficiently Active (05/04/2020)   Exercise Vital Sign    Days of Exercise per Week: 2 days    Minutes of Exercise per Session: 30 min  Stress: No Stress Concern Present (05/04/2020)   [place]    Feeling of Stress : Not at all  Social Connections: Moderately Isolated (05/04/2020)   Social Connection and Isolation Panel [NHANES]    Frequency of Communication with Friends and Family: Three times a week    Frequency of Social Gatherings with Friends and Family: Never    Attends Religious Services: Never    Marine scientist or Organizations: No    Attends Music therapist: Never    Marital Status: Married    Review of Systems  Constitutional:  Negative for chills, fatigue, fever and unexpected weight change.  HENT:  Negative for congestion, ear pain, sinus pain and sore throat.   Eyes:  Negative for visual disturbance.  Respiratory:  Positive for apnea and shortness of breath (3 liters oxygen). Negative for cough.   Cardiovascular:  Negative for chest pain and palpitations.  Gastrointestinal:  Negative for abdominal pain, blood in stool, constipation, diarrhea, nausea and vomiting.  Endocrine: Negative for polydipsia.  Genitourinary:  Negative for dysuria.  Musculoskeletal:  Negative for back pain.  Skin:  Negative for rash.  Neurological:  Negative for headaches.     Objective:  BP 110/70   Pulse 72   Temp 97.6 F (36.4 C)   Resp 16   Ht '6\' 1"'$  (1.854 m)   Wt 286 lb (129.7  kg)   SpO2 98%   BMI 37.73 kg/m      01/18/2022    2:57 PM 11/03/2021   12:41 PM 10/05/2021   10:00 AM  BP/Weight  Systolic BP 016 010 932  Diastolic BP 70 67 80  Wt. (Lbs) 286 272 283  BMI 37.73 kg/m2 35.89 kg/m2 37.34 kg/m2    Physical Exam Vitals reviewed.  Constitutional:      Appearance: He is ill-appearing.  HENT:     Head: Normocephalic.     Right Ear: Tympanic membrane normal.     Left Ear: Tympanic membrane normal.     Mouth/Throat:     Mouth: Mucous membranes are moist.  Eyes:     Conjunctiva/sclera: Conjunctivae normal.     Pupils: Pupils are equal, round, and reactive to light.  Cardiovascular:     Rate and Rhythm: Normal rate and regular rhythm.     Pulses: Normal pulses.     Heart sounds: Normal heart sounds. No murmur heard.    No gallop.  Pulmonary:     Effort: Pulmonary effort is normal. No respiratory distress.     Breath sounds: Rhonchi present.  Abdominal:     General: Abdomen is flat. Bowel sounds are normal. There is no distension.     Tenderness: There is no abdominal tenderness.  Musculoskeletal:        General: Normal range of motion.     Cervical back: Normal range of motion.  Skin:    General: Skin is warm.     Capillary Refill: Capillary refill takes less than 2 seconds.     Comments: 8m pearly raised lesion right forearm  Neurological:     General: No focal deficit present.     Mental Status: He is alert and oriented to person, place, and time. Mental status is at baseline.         Lab Results  Component Value Date   WBC 6.4 10/05/2021   HGB 9.4 (L) 10/05/2021   HCT 31.2 (L) 10/05/2021   PLT 126 (L) 10/05/2021   GLUCOSE 108 (H) 10/05/2021   CHOL 78 (L) 08/09/2021   TRIG 72 08/09/2021   HDL 27 (L) 08/09/2021   LDLCALC 35 08/09/2021   ALT 22 10/05/2021   AST 26 10/05/2021   NA 142 10/05/2021   K 4.7 10/05/2021   CL 104 10/05/2021   CREATININE 2.96 (H) 10/05/2021   BUN 50 (H) 10/05/2021   CO2 22 10/05/2021   INR 2.6  (H) 01/13/2022   HGBA1C 6.2 (H) 08/09/2021      Assessment & Plan:   Problem List Items Addressed This Visit       Respiratory   OSA (obstructive sleep apnea) - Primary Using CPAP consistently every night and medically benefiting from its use.      Nervous and Auditory   Hearing loss Sudden hearing loss at weeding 2 months ago   Other Visit Diagnoses     Chronic respiratory failure with hypoxia, on home oxygen therapy (HCC)       Primary insomnia       Relevant Medications   zolpidem (AMBIEN) 5 MG tablet Patient oin 2L/O2 via nasal canula  Basal cell carcinoma Refer to dermatology              .  Meds ordered this encounter  Medications   zolpidem (AMBIEN) 5 MG tablet    Sig: Take 1 tablet (5 mg total) by mouth at bedtime as needed for sleep.    Dispense:  15 tablet    Refill:  3    Orders Placed This Encounter  Procedures   Ambulatory referral to ENT     Follow-up: Return in about 4 months (around 05/20/2022).  An After Visit Summary was printed and given to the patient.  Reinaldo Meeker, MD [facility] [phone]

## 2022-01-24 ENCOUNTER — Encounter: Payer: Self-pay | Admitting: Legal Medicine

## 2022-01-24 ENCOUNTER — Ambulatory Visit (INDEPENDENT_AMBULATORY_CARE_PROVIDER_SITE_OTHER): Payer: Medicare Other | Admitting: Legal Medicine

## 2022-01-24 VITALS — BP 100/60 | HR 71 | Temp 97.3°F | Resp 15 | Ht 73.0 in | Wt 286.0 lb

## 2022-01-24 DIAGNOSIS — L03116 Cellulitis of left lower limb: Secondary | ICD-10-CM | POA: Diagnosis not present

## 2022-01-24 DIAGNOSIS — Z23 Encounter for immunization: Secondary | ICD-10-CM

## 2022-01-24 DIAGNOSIS — I5043 Acute on chronic combined systolic (congestive) and diastolic (congestive) heart failure: Secondary | ICD-10-CM

## 2022-01-24 MED ORDER — CEPHALEXIN 500 MG PO CAPS: 500.0000 mg | ORAL_CAPSULE | Freq: Four times a day (QID) | ORAL | 0 refills | Status: DC

## 2022-01-24 MED ORDER — FUROSEMIDE 40 MG PO TABS: 60.0000 mg | ORAL_TABLET | Freq: Three times a day (TID) | ORAL | 1 refills | Status: AC

## 2022-01-24 NOTE — Progress Notes (Signed)
Acute Office Visit  Subjective:    Patient ID: Steven Hester, male    DOB: 04-May-1946, 76 y.o.   MRN: [id number]  Chief Complaint  Patient presents with   Blister    Both lower legs   Edema    HPI: Patient is in today for edema on both lower leg since 3 days ago. He stated they were dripping and they are sore. He noticed redness, blister on both legs.  Past Medical History:  Diagnosis Date   CAD (coronary artery disease)    DES to RCA, circumflex, OM in [place]   CHF (congestive heart failure) (HCC)    CKD (chronic kidney disease) stage 4, GFR 15-29 ml/min (HCC)    Essential hypertension    Inferior myocardial infarction (HCC)    Ischemic cardiomyopathy    Mitral regurgitation    Mixed hyperlipidemia    Pacemaker    Medtronic   Persistent atrial fibrillation (HCC)    Sinus node dysfunction (HCC)    Type 2 diabetes mellitus ([employer])     Past Surgical History:  Procedure Laterality Date   COLON SURGERY     CORONARY ANGIOPLASTY WITH STENT PLACEMENT     PACEMAKER INSERTION      Family History  Problem Relation Age of Onset   Heart attack Mother    GI Disease Father    Cancer Father     Social History   Socioeconomic History   Marital status: Married    Spouse name: Silva Bandy   Number of children: 1   Years of education: Not on file   Highest education level: Not on file  Occupational History   Not on file  Tobacco Use   Smoking status: Former    Packs/day: 1.00    Years: 10.00    Total pack years: 10.00    Types: Cigarettes   Smokeless tobacco: Never  Vaping Use   Vaping Use: Never used  Substance and Sexual Activity   Alcohol use: Yes    Comment: occasionally   Drug use: Never   Sexual activity: Not Currently    Partners: Female  Other Topics Concern   Not on file  Social History Narrative   Not on file   Social Determinants of Health   Financial Resource Strain: Low Risk  (05/04/2020)   Overall Financial Resource Strain  (CARDIA)    Difficulty of Paying Living Expenses: Not hard at all  Food Insecurity: No Food Insecurity (05/05/2021)   Hunger Vital Sign    Worried About Running Out of Food in the Last Year: Never true    Ran Out of Food in the Last Year: Never true  Transportation Needs: No Transportation Needs (05/05/2021)   PRAPARE - Hydrologist (Medical): No    Lack of Transportation (Non-Medical): No  Physical Activity: Insufficiently Active (05/04/2020)   Exercise Vital Sign    Days of Exercise per Week: 2 days    Minutes of Exercise per Session: 30 min  Stress: No Stress Concern Present (05/04/2020)   [place]    Feeling of Stress : Not at all  Social Connections: Moderately Isolated (05/04/2020)   Social Connection and Isolation Panel [NHANES]    Frequency of Communication with Friends and Family: Three times a week    Frequency of Social Gatherings with Friends and Family: Never    Attends Religious Services: Never    Active Member of Clubs or  Organizations: No    Attends Archivist Meetings: Never    Marital Status: Married  Human resources officer Violence: Not At Risk (05/05/2021)   Humiliation, Afraid, Rape, and Kick questionnaire    Fear of Current or Ex-Partner: No    Emotionally Abused: No    Physically Abused: No    Sexually Abused: No    Outpatient Medications Prior to Visit  Medication Sig Dispense Refill   ACCU-CHEK GUIDE test strip USE 1  TWICE DAILY 100 each 0   acetaminophen-codeine (TYLENOL #3) 300-30 MG tablet Take 2 tablets by mouth every 4 (four) hours as needed for moderate pain. 60 tablet 3   amiodarone (PACERONE) 200 MG tablet Take 200 mg by mouth daily.     atorvastatin (LIPITOR) 80 MG tablet Take 80 mg by mouth daily.     carvedilol (COREG) 3.125 MG tablet Take 3.125 mg by mouth 2 (two) times daily.     ferrous sulfate 325 (65 FE) MG tablet Take 1 tablet (325 mg  total) by mouth 2 (two) times daily with a meal. 60 tablet 2   isosorbide mononitrate (IMDUR) 30 MG 24 hr tablet Take 30 mg by mouth daily.     nitroGLYCERIN (NITROSTAT) 0.4 MG SL tablet Place under the tongue.     OXYGEN Inhale 2 L into the lungs daily.     potassium chloride SA (KLOR-CON M) 20 MEQ tablet Take 1 tablet by mouth daily.     Respiratory Therapy Supplies (CARETOUCH CPAP & BIPAP HOSE) MISC by Does not apply route. 5-10 H2O pressure     Sennosides (LAXATIVE) 25 MG TABS Take 25 mg by mouth daily.     spironolactone (ALDACTONE) 25 MG tablet Take 1 tablet (25 mg total) by mouth daily. 30 tablet 3   warfarin (COUMADIN) 3 MG tablet TAKE 1 TABLET BY MOUTH MONDAY, WEDNESDAY, FRIDAY, AND SUNDAY. AND THEN TAKE 1/2 TABLET BY MOUTH TUESDAY, THURSDAY, AND SATURDAY 30 tablet 0   warfarin (COUMADIN) 4 MG tablet Take 1 tablet (4 mg total) by mouth daily. 30 tablet 0   warfarin (COUMADIN) 5 MG tablet Take by mouth.     zolpidem (AMBIEN) 5 MG tablet Take 1 tablet (5 mg total) by mouth at bedtime as needed for sleep. 15 tablet 3   furosemide (LASIX) 40 MG tablet Take 1.5 tablets (60 mg total) by mouth 2 (two) times daily. 60 tablet 1   No facility-administered medications prior to visit.    Allergies  Allergen Reactions   Ezetimibe Hives    Zetia    Simvastatin Other (See Comments)    Other reaction(s): Myalgias (intolerance)   Niacin Rash    Review of Systems  Constitutional:  Negative for chills, fatigue, fever and unexpected weight change.  HENT:  Negative for congestion, ear pain, sinus pain and sore throat.   Eyes:  Negative for visual disturbance.  Respiratory:  Negative for cough and shortness of breath.   Cardiovascular:  Positive for leg swelling (bilateral). Negative for chest pain and palpitations.  Gastrointestinal:  Negative for abdominal pain, blood in stool, constipation, diarrhea, nausea and vomiting.  Endocrine: Negative for polydipsia.  Genitourinary:  Negative for  dysuria.  Musculoskeletal:  Negative for back pain.  Skin:  Negative for rash.  Neurological:  Negative for headaches.       Objective:    Physical Exam Vitals reviewed.  Constitutional:      Appearance: Normal appearance. He is obese. He is ill-appearing.  HENT:  Right Ear: Tympanic membrane normal.     Left Ear: Tympanic membrane normal.  Cardiovascular:     Rate and Rhythm: Normal rate and regular rhythm.     Pulses: Normal pulses.     Heart sounds:     No gallop (s4).  Pulmonary:     Effort: Pulmonary effort is normal. No respiratory distress.     Breath sounds: No wheezing.  Musculoskeletal:     Cervical back: Normal range of motion.     Right lower leg: 3+ Pitting Edema present.     Left lower leg: 3+ Pitting Edema present.       Legs:     Comments: 59m stage 1 ulcer, no purulence  Neurological:     Mental Status: He is alert.     BP 100/60   Pulse 71   Temp (!) 97.3 F (36.3 C)   Resp 15   Ht _0  (1.854 m)   Wt 286 lb (129.7 kg)   SpO2 95%   BMI 37.73 kg/m  Wt Readings from Last 3 Encounters:  01/24/22 286 lb (129.7 kg)  01/18/22 286 lb (129.7 kg)  11/03/21 272 lb (123.4 kg)    Health Maintenance Due  Topic Date Due   Hepatitis C Screening  Never done   Zoster Vaccines- Shingrix (1 of 2) Never done   COVID-19 Vaccine (6 - Moderna risk series) 04/06/2021   OPHTHALMOLOGY EXAM  12/21/2021    There are no preventive care reminders to display for this patient.   No results found for: "TSH" Lab Results  Component Value Date   WBC 6.4 10/05/2021   HGB 9.4 (L) 10/05/2021   HCT 31.2 (L) 10/05/2021   MCV 86 10/05/2021   PLT 126 (L) 10/05/2021   Lab Results  Component Value Date   NA 142 10/05/2021   K 4.7 10/05/2021   CO2 22 10/05/2021   GLUCOSE 108 (H) 10/05/2021   BUN 50 (H) 10/05/2021   CREATININE 2.96 (H) 10/05/2021   BILITOT 0.8 10/05/2021   ALKPHOS 111 10/05/2021   AST 26 10/05/2021   ALT 22 10/05/2021   PROT 6.4 10/05/2021    ALBUMIN 3.3 (L) 10/05/2021   CALCIUM 8.4 (L) 10/05/2021   ANIONGAP 10 05/02/2021   EGFR 21 (L) 10/05/2021   Lab Results  Component Value Date   CHOL 78 (L) 08/09/2021   Lab Results  Component Value Date   HDL 27 (L) 08/09/2021   Lab Results  Component Value Date   LDLCALC 35 08/09/2021   Lab Results  Component Value Date   TRIG 72 08/09/2021   Lab Results  Component Value Date   CHOLHDL 2.9 08/09/2021   Lab Results  Component Value Date   HGBA1C 6.2 (H) 08/09/2021       Assessment & Plan:   Problem List Items Addressed This Visit       Cardiovascular and Mediastinum   Acute on chronic combined systolic and diastolic CHF (congestive heart failure) (HCC)   Relevant Medications   furosemide (LASIX) 40 MG tablet   Other Relevant Orders   Basic Metabolic Panel   Pro b natriuretic peptide Patient appears to be is some CHF with PND and peripheral edema.  He has skin breakdown 7cm   Other Visit Diagnoses     Need for immunization against influenza    -  Primary   Relevant Orders   Flu Vaccine QUAD High Dose(Fluad) (Completed)   Cellulitis of left lower extremity  Relevant Medications   cephALEXin (KEFLEX) 500 MG capsule Redness and swelling left leg, start keflex.  Unna boot for one week      Meds ordered this encounter  Medications   furosemide (LASIX) 40 MG tablet    Sig: Take 1.5 tablets (60 mg total) by mouth in the morning, at noon, and at bedtime.    Dispense:  60 tablet    Refill:  1   cephALEXin (KEFLEX) 500 MG capsule    Sig: Take 1 capsule (500 mg total) by mouth 4 (four) times daily.    Dispense:  40 capsule    Refill:  0    Orders Placed This Encounter  Procedures   Flu Vaccine QUAD High Dose(Fluad)   Basic Metabolic Panel   Pro b natriuretic peptide     Follow-up: Return in about 1 week (around 01/31/2022), or ulcer.  An After Visit Summary was printed and given to the patient.  Reinaldo Meeker, MD [facility] [phone]

## 2022-01-25 LAB — BASIC METABOLIC PANEL
BUN/Creatinine Ratio: 17 (ref 10–24)
BUN: 44 mg/dL — ABNORMAL HIGH (ref 8–27)
CO2: 26 mmol/L (ref 20–29)
Calcium: 8.3 mg/dL — ABNORMAL LOW (ref 8.6–10.2)
Chloride: 103 mmol/L (ref 96–106)
Creatinine, Ser: 2.53 mg/dL — ABNORMAL HIGH (ref 0.76–1.27)
Glucose: 128 mg/dL — ABNORMAL HIGH (ref 70–99)
Potassium: 3.9 mmol/L (ref 3.5–5.2)
Sodium: 146 mmol/L — ABNORMAL HIGH (ref 134–144)
eGFR: 25 mL/min/{1.73_m2} — ABNORMAL LOW (ref >59)

## 2022-01-25 LAB — PRO B NATRIURETIC PEPTIDE: NT-Pro BNP: 5696 pg/mL — ABNORMAL HIGH (ref 0–486)

## 2022-01-25 NOTE — Progress Notes (Signed)
Still elevated pro bnp but improved since last time, kidney tests no changes lp

## 2022-01-26 DIAGNOSIS — Z950 Presence of cardiac pacemaker: Secondary | ICD-10-CM | POA: Diagnosis not present

## 2022-01-30 DIAGNOSIS — G4733 Obstructive sleep apnea (adult) (pediatric): Secondary | ICD-10-CM | POA: Diagnosis not present

## 2022-01-30 DIAGNOSIS — J449 Chronic obstructive pulmonary disease, unspecified: Secondary | ICD-10-CM | POA: Diagnosis not present

## 2022-02-01 ENCOUNTER — Ambulatory Visit (INDEPENDENT_AMBULATORY_CARE_PROVIDER_SITE_OTHER): Payer: Medicare Other | Admitting: Legal Medicine

## 2022-02-01 ENCOUNTER — Encounter: Payer: Self-pay | Admitting: Legal Medicine

## 2022-02-01 VITALS — BP 100/60 | HR 63 | Temp 97.6°F | Resp 14 | Ht 73.0 in | Wt 287.0 lb

## 2022-02-01 DIAGNOSIS — L03116 Cellulitis of left lower limb: Secondary | ICD-10-CM | POA: Diagnosis not present

## 2022-02-01 NOTE — Progress Notes (Signed)
Subjective:  Patient ID: Steven Hester, male    DOB: Mar 25, 1946  Age: 76 y.o. MRN: [id number]  Chief Complaint  Patient presents with   Wound Check    HPI   Patient is here for swelling and blister on left lower leg. His leg was wrap up with Unna boot for 3 days and then home heath nurse took it off 5 days ago and did not re-apply.It is open and bloody 3 by 4cm.  His lower leg is looks better however blister still there. Current Outpatient Medications on File Prior to Visit  Medication Sig Dispense Refill   ACCU-CHEK GUIDE test strip USE 1  TWICE DAILY 100 each 0   acetaminophen-codeine (TYLENOL #3) 300-30 MG tablet Take 2 tablets by mouth every 4 (four) hours as needed for moderate pain. 60 tablet 3   amiodarone (PACERONE) 200 MG tablet Take 200 mg by mouth daily.     atorvastatin (LIPITOR) 80 MG tablet Take 80 mg by mouth daily.     carvedilol (COREG) 3.125 MG tablet Take 3.125 mg by mouth 2 (two) times daily.     cephALEXin (KEFLEX) 500 MG capsule Take 1 capsule (500 mg total) by mouth 4 (four) times daily. 40 capsule 0   ferrous sulfate 325 (65 FE) MG tablet Take 1 tablet (325 mg total) by mouth 2 (two) times daily with a meal. 60 tablet 2   furosemide (LASIX) 40 MG tablet Take 1.5 tablets (60 mg total) by mouth in the morning, at noon, and at bedtime. 60 tablet 1   isosorbide mononitrate (IMDUR) 30 MG 24 hr tablet Take 30 mg by mouth daily.     nitroGLYCERIN (NITROSTAT) 0.4 MG SL tablet Place under the tongue.     OXYGEN Inhale 2 L into the lungs daily.     potassium chloride SA (KLOR-CON M) 20 MEQ tablet Take 1 tablet by mouth daily.     Respiratory Therapy Supplies (CARETOUCH CPAP & BIPAP HOSE) MISC by Does not apply route. 5-10 H2O pressure     Sennosides (LAXATIVE) 25 MG TABS Take 25 mg by mouth daily.     spironolactone (ALDACTONE) 25 MG tablet Take 1 tablet (25 mg total) by mouth daily. 30 tablet 3   warfarin (COUMADIN) 3 MG tablet TAKE 1 TABLET BY MOUTH MONDAY,  WEDNESDAY, FRIDAY, AND SUNDAY. AND THEN TAKE 1/2 TABLET BY MOUTH TUESDAY, THURSDAY, AND SATURDAY 30 tablet 0   warfarin (COUMADIN) 4 MG tablet Take 1 tablet (4 mg total) by mouth daily. 30 tablet 0   warfarin (COUMADIN) 5 MG tablet Take by mouth.     zolpidem (AMBIEN) 5 MG tablet Take 1 tablet (5 mg total) by mouth at bedtime as needed for sleep. 15 tablet 3   No current facility-administered medications on file prior to visit.   Past Medical History:  Diagnosis Date   CAD (coronary artery disease)    DES to RCA, circumflex, OM in [place]   CHF (congestive heart failure) (HCC)    CKD (chronic kidney disease) stage 4, GFR 15-29 ml/min (HCC)    Essential hypertension    Inferior myocardial infarction [facility])    Ischemic cardiomyopathy    Mitral regurgitation    Mixed hyperlipidemia    Pacemaker    Medtronic   Persistent atrial fibrillation (HCC)    Sinus node dysfunction (HCC)    Type 2 diabetes mellitus ([place])    Past Surgical History:  Procedure Laterality Date   COLON SURGERY  CORONARY ANGIOPLASTY WITH STENT PLACEMENT     PACEMAKER INSERTION      Family History  Problem Relation Age of Onset   Heart attack Mother    GI Disease Father    Cancer Father    Social History   Socioeconomic History   Marital status: Married    Spouse name: Silva Bandy   Number of children: 1   Years of education: Not on file   Highest education level: Not on file  Occupational History   Not on file  Tobacco Use   Smoking status: Former    Packs/day: 1.00    Years: 10.00    Total pack years: 10.00    Types: Cigarettes   Smokeless tobacco: Never  Vaping Use   Vaping Use: Never used  Substance and Sexual Activity   Alcohol use: Yes    Comment: occasionally   Drug use: Never   Sexual activity: Not Currently    Partners: Female  Other Topics Concern   Not on file  Social History Narrative   Not on file   Social Determinants of Health   Financial Resource Strain:  Low Risk  (05/04/2020)   Overall Financial Resource Strain (CARDIA)    Difficulty of Paying Living Expenses: Not hard at all  Food Insecurity: No Food Insecurity (05/05/2021)   Hunger Vital Sign    Worried About Running Out of Food in the Last Year: Never true    Ran Out of Food in the Last Year: Never true  Transportation Needs: No Transportation Needs (05/05/2021)   PRAPARE - Hydrologist (Medical): No    Lack of Transportation (Non-Medical): No  Physical Activity: Insufficiently Active (05/04/2020)   Exercise Vital Sign    Days of Exercise per Week: 2 days    Minutes of Exercise per Session: 30 min  Stress: No Stress Concern Present (05/04/2020)   [place]    Feeling of Stress : Not at all  Social Connections: Moderately Isolated (05/04/2020)   Social Connection and Isolation Panel [NHANES]    Frequency of Communication with Friends and Family: Three times a week    Frequency of Social Gatherings with Friends and Family: Never    Attends Religious Services: Never    Marine scientist or Organizations: No    Attends Music therapist: Never    Marital Status: Married    Review of Systems  Constitutional:  Negative for chills, fatigue, fever and unexpected weight change.  HENT:  Negative for congestion, ear pain, sinus pain and sore throat.   Eyes:  Negative for visual disturbance.  Cardiovascular:  Positive for leg swelling (Left lower leg). Negative for chest pain and palpitations.  Gastrointestinal:  Negative for abdominal pain, blood in stool, constipation, diarrhea, nausea and vomiting.  Endocrine: Negative for polydipsia.  Genitourinary:  Negative for dysuria.  Musculoskeletal:  Negative for back pain.  Skin:  Negative for rash.  Neurological:  Negative for headaches.     Objective:  BP 100/60   Pulse 63   Temp 97.6 F (36.4 C)   Resp 14   Ht '6\' 1"'$   (1.854 m)   Wt 287 lb (130.2 kg)   SpO2 93% Comment: 3 L oxygen  BMI 37.87 kg/m      02/01/2022    3:00 PM 01/24/2022    2:37 PM 01/18/2022    2:57 PM  BP/Weight  Systolic BP 616 073 710  Diastolic  BP 60 60 70  Wt. (Lbs) 287 286 286  BMI 37.87 kg/m2 37.73 kg/m2 37.73 kg/m2    Physical Exam Vitals reviewed.  Constitutional:      Appearance: Normal appearance.  HENT:     Head: Normocephalic and atraumatic.     Right Ear: Tympanic membrane normal.     Left Ear: Tympanic membrane normal.     Nose: Nose normal.     Mouth/Throat:     Mouth: Mucous membranes are moist.     Pharynx: Oropharynx is clear.  Eyes:     Extraocular Movements: Extraocular movements intact.     Conjunctiva/sclera: Conjunctivae normal.     Pupils: Pupils are equal, round, and reactive to light.  Cardiovascular:     Rate and Rhythm: Normal rate and regular rhythm.     Pulses: Normal pulses.     Heart sounds: Normal heart sounds. No murmur heard.    No gallop.  Pulmonary:     Effort: Pulmonary effort is normal. No respiratory distress.     Breath sounds: Normal breath sounds. No wheezing.  Abdominal:     General: Abdomen is flat. Bowel sounds are normal. There is no distension.     Palpations: Abdomen is soft.     Tenderness: There is no abdominal tenderness.  Musculoskeletal:        General: Normal range of motion.     Cervical back: Normal range of motion and neck supple.  Skin:    General: Skin is warm.     Capillary Refill: Capillary refill takes less than 2 seconds.     Findings: Lesion present.     Comments: 3 by 4 cm stage 2 ulcer lateral aspect left lower get.  It is open to air and bleeding.  Still 2+ edema.  Neurological:     General: No focal deficit present.     Mental Status: He is alert and oriented to person, place, and time. Mental status is at baseline.         Lab Results  Component Value Date   WBC 6.4 10/05/2021   HGB 9.4 (L) 10/05/2021   HCT 31.2 (L) 10/05/2021   PLT  126 (L) 10/05/2021   GLUCOSE 128 (H) 01/24/2022   CHOL 78 (L) 08/09/2021   TRIG 72 08/09/2021   HDL 27 (L) 08/09/2021   LDLCALC 35 08/09/2021   ALT 22 10/05/2021   AST 26 10/05/2021   NA 146 (H) 01/24/2022   K 3.9 01/24/2022   CL 103 01/24/2022   CREATININE 2.53 (H) 01/24/2022   BUN 44 (H) 01/24/2022   CO2 26 01/24/2022   INR 2.6 (H) 01/13/2022   HGBA1C 6.2 (H) 08/09/2021      Assessment & Plan:   Problem List Items Addressed This Visit   None Visit Diagnoses     Cellulitis of left lower extremity    -  Primary No further evidence for infection but his ulcer on left leg is bloody.  Clean and re-wap with unna boot for one full week     .       Follow-up: No follow-ups on file.  An After Visit Summary was printed and given to the patient.  Reinaldo Meeker, MD [facility] [phone]

## 2022-02-03 ENCOUNTER — Telehealth: Payer: Self-pay

## 2022-02-03 DIAGNOSIS — J9611 Chronic respiratory failure with hypoxia: Secondary | ICD-10-CM | POA: Diagnosis not present

## 2022-02-03 NOTE — Patient Outreach (Signed)
  Care Coordination   Initial Visit Note   02/03/2022 Name: Steven Hester MRN: [id number] DOB: 1945-10-05  Steven Hester is a 76 y.o. year old male who sees Steven Anes, MD for primary care. I spoke with  Steven Anes Ante by phone today.  What matters to the patients health and wellness today?  Placed call to patient today and explained [facility] care coordination program.  Patient reports that he needs a dressing change and does not know who the home health company is.    Goals Addressed               This Visit's Progress     I dont know who my home health company is and I have cellulitis (pt-stated)        Care Coordination Interventions: Reviewed patients concern about no seeing a home health nurse for over a week. Reports he does not know the company.  Patient provided me with a contact number of [phone] Placed call to this number and spoke with Suanne Marker at [facility]. She reports that patient was active with them from March to July and patient was discharged with goals met. Placed call to MD office and spoke with Hoyle Sauer-  she reports she does not know home health agency either.  Reports patient is coming back for wound care on 02/08/2022 Placed call back to patient to inform.   Patient denies any other needs.          SDOH assessments and interventions completed:  No     Care Coordination Interventions Activated:  Yes  Care Coordination Interventions:  Yes, provided   Follow up plan: No further intervention required.   Encounter Outcome:  Pt. Visit Completed   Tomasa Rand, RN, BSN, CEN [place] Coordinator [phone]

## 2022-02-04 DIAGNOSIS — J9611 Chronic respiratory failure with hypoxia: Secondary | ICD-10-CM | POA: Diagnosis not present

## 2022-02-07 DIAGNOSIS — I2510 Atherosclerotic heart disease of native coronary artery without angina pectoris: Secondary | ICD-10-CM | POA: Diagnosis not present

## 2022-02-07 DIAGNOSIS — N184 Chronic kidney disease, stage 4 (severe): Secondary | ICD-10-CM | POA: Diagnosis not present

## 2022-02-07 DIAGNOSIS — I4891 Unspecified atrial fibrillation: Secondary | ICD-10-CM | POA: Diagnosis not present

## 2022-02-07 DIAGNOSIS — I130 Hypertensive heart and chronic kidney disease with heart failure and stage 1 through stage 4 chronic kidney disease, or unspecified chronic kidney disease: Secondary | ICD-10-CM | POA: Diagnosis not present

## 2022-02-07 DIAGNOSIS — I5020 Unspecified systolic (congestive) heart failure: Secondary | ICD-10-CM | POA: Diagnosis not present

## 2022-02-07 DIAGNOSIS — N2581 Secondary hyperparathyroidism of renal origin: Secondary | ICD-10-CM | POA: Diagnosis not present

## 2022-02-07 DIAGNOSIS — D649 Anemia, unspecified: Secondary | ICD-10-CM | POA: Diagnosis not present

## 2022-02-07 DIAGNOSIS — E1122 Type 2 diabetes mellitus with diabetic chronic kidney disease: Secondary | ICD-10-CM | POA: Diagnosis not present

## 2022-02-08 ENCOUNTER — Ambulatory Visit (INDEPENDENT_AMBULATORY_CARE_PROVIDER_SITE_OTHER): Payer: Medicare Other | Admitting: Legal Medicine

## 2022-02-08 ENCOUNTER — Encounter: Payer: Self-pay | Admitting: Legal Medicine

## 2022-02-08 DIAGNOSIS — L03116 Cellulitis of left lower limb: Secondary | ICD-10-CM | POA: Diagnosis not present

## 2022-02-08 NOTE — Progress Notes (Signed)
   Acute Office Visit  Subjective:     Patient ID: Steven Hester, male    DOB: 1945-09-12, 76 y.o.   MRN: [id number]  Chief Complaint  Patient presents with   Wound Check    HPI Patient is in today for follow up on stasis ulcer left leg.  Review of Systems  Constitutional: Negative.   HENT: Negative.    Eyes:  Negative for redness.  Respiratory: Negative.  Negative for cough.   Cardiovascular:  Negative for chest pain and claudication.  Genitourinary:  Negative for dysuria.  Musculoskeletal:  Negative for myalgias and neck pain.  Neurological: Negative.   Psychiatric/Behavioral: Negative.          Objective:    BP 100/60   Pulse 86   Temp 97.8 F (36.6 C)   Resp 17   Ht '6\' 1"'$  (1.854 m)   Wt 282 lb (127.9 kg)   SpO2 95%   BMI 37.21 kg/m    Physical Exam Constitutional:      General: He is not in acute distress.    Appearance: Normal appearance.  HENT:     Right Ear: Tympanic membrane normal.     Nose: Nose normal.     Mouth/Throat:     Mouth: Mucous membranes are moist.     Pharynx: Oropharynx is clear.  Eyes:     Extraocular Movements: Extraocular movements intact.     Conjunctiva/sclera: Conjunctivae normal.     Pupils: Pupils are equal, round, and reactive to light.  Cardiovascular:     Rate and Rhythm: Normal rate and regular rhythm.     Pulses: Normal pulses.     Heart sounds: Normal heart sounds. No murmur heard.    No gallop.  Pulmonary:     Effort: Pulmonary effort is normal. No respiratory distress.     Breath sounds: Normal breath sounds. No wheezing.  Abdominal:     General: Abdomen is flat. Bowel sounds are normal. There is no distension.     Palpations: Abdomen is soft.     Tenderness: There is no abdominal tenderness.  Musculoskeletal:        General: Normal range of motion.     Cervical back: Normal range of motion.     Right lower leg: No edema.     Left lower leg: No edema.  Skin:    General: Skin is warm.     Capillary  Refill: Capillary refill takes less than 2 seconds.     Comments: Healed ulcer left lower leg, no drainage, may keep open  Neurological:     Mental Status: He is alert.           Assessment & Plan:   Problem List Items Addressed This Visit       Other   Cellulitis of left leg Cellulitis is resolving and stage 1 to 2 ulcer left lower leg healed., May keep open      Return if symptoms worsen or fail to improve.  Reinaldo Meeker, MD

## 2022-02-12 DIAGNOSIS — L03116 Cellulitis of left lower limb: Secondary | ICD-10-CM | POA: Insufficient documentation

## 2022-02-15 ENCOUNTER — Ambulatory Visit (INDEPENDENT_AMBULATORY_CARE_PROVIDER_SITE_OTHER): Payer: Medicare Other | Admitting: Legal Medicine

## 2022-02-15 ENCOUNTER — Encounter: Payer: Self-pay | Admitting: Legal Medicine

## 2022-02-15 VITALS — BP 90/60 | HR 73 | Temp 97.3°F | Resp 16 | Ht 73.0 in | Wt 288.0 lb

## 2022-02-15 DIAGNOSIS — L578 Other skin changes due to chronic exposure to nonionizing radiation: Secondary | ICD-10-CM | POA: Diagnosis not present

## 2022-02-15 DIAGNOSIS — C44629 Squamous cell carcinoma of skin of left upper limb, including shoulder: Secondary | ICD-10-CM | POA: Diagnosis not present

## 2022-02-15 DIAGNOSIS — L814 Other melanin hyperpigmentation: Secondary | ICD-10-CM | POA: Diagnosis not present

## 2022-02-15 DIAGNOSIS — L03116 Cellulitis of left lower limb: Secondary | ICD-10-CM

## 2022-02-15 NOTE — Progress Notes (Signed)
Subjective:  Patient ID: Steven Hester, male    DOB: 10-09-1945  Age: 76 y.o. MRN: [id number]  Chief Complaint  Patient presents with   Leg Swelling    HPI Patient is here for swelling on both lower legs since 5 days ago. He mentioned his left lower leg was leaking and it is a new ulcer on his leg. Right lower leg is redness and swelling., no dyspnea or PND. He gained  6 pound since his last visit on 02/08/2022.  Current Outpatient Medications on File Prior to Visit  Medication Sig Dispense Refill   ACCU-CHEK GUIDE test strip USE 1  TWICE DAILY 100 each 0   acetaminophen-codeine (TYLENOL #3) 300-30 MG tablet Take 2 tablets by mouth every 4 (four) hours as needed for moderate pain. 60 tablet 3   amiodarone (PACERONE) 200 MG tablet Take 200 mg by mouth daily.     atorvastatin (LIPITOR) 80 MG tablet Take 80 mg by mouth daily.     carvedilol (COREG) 3.125 MG tablet Take 3.125 mg by mouth 2 (two) times daily.     ferrous sulfate 325 (65 FE) MG tablet Take 1 tablet (325 mg total) by mouth 2 (two) times daily with a meal. 60 tablet 2   furosemide (LASIX) 40 MG tablet Take 1.5 tablets (60 mg total) by mouth in the morning, at noon, and at bedtime. 60 tablet 1   isosorbide mononitrate (IMDUR) 30 MG 24 hr tablet Take 30 mg by mouth daily.     nitroGLYCERIN (NITROSTAT) 0.4 MG SL tablet Place under the tongue.     OXYGEN Inhale 2 L into the lungs daily.     potassium chloride SA (KLOR-CON M) 20 MEQ tablet Take 1 tablet by mouth daily.     Respiratory Therapy Supplies (CARETOUCH CPAP & BIPAP HOSE) MISC by Does not apply route. 5-10 H2O pressure     Sennosides (LAXATIVE) 25 MG TABS Take 25 mg by mouth daily.     spironolactone (ALDACTONE) 25 MG tablet Take 1 tablet (25 mg total) by mouth daily. 30 tablet 3   warfarin (COUMADIN) 3 MG tablet TAKE 1 TABLET BY MOUTH MONDAY, WEDNESDAY, FRIDAY, AND SUNDAY. AND THEN TAKE 1/2 TABLET BY MOUTH TUESDAY, THURSDAY, AND SATURDAY 30 tablet 0   warfarin  (COUMADIN) 4 MG tablet Take 1 tablet (4 mg total) by mouth daily. 30 tablet 0   warfarin (COUMADIN) 5 MG tablet Take by mouth.     zolpidem (AMBIEN) 5 MG tablet Take 1 tablet (5 mg total) by mouth at bedtime as needed for sleep. 15 tablet 3   No current facility-administered medications on file prior to visit.   Past Medical History:  Diagnosis Date   CAD (coronary artery disease)    DES to RCA, circumflex, OM in [place]   CHF (congestive heart failure) (HCC)    CKD (chronic kidney disease) stage 4, GFR 15-29 ml/min (HCC)    Essential hypertension    Inferior myocardial infarction (HCC)    Ischemic cardiomyopathy    Mitral regurgitation    Mixed hyperlipidemia    Pacemaker    Medtronic   Persistent atrial fibrillation (HCC)    Sinus node dysfunction (HCC)    Type 2 diabetes mellitus ([place])    Past Surgical History:  Procedure Laterality Date   COLON SURGERY     CORONARY ANGIOPLASTY WITH STENT PLACEMENT     PACEMAKER INSERTION      Family History  Problem Relation Age of Onset  Heart attack Mother    GI Disease Father    Cancer Father    Social History   Socioeconomic History   Marital status: Married    Spouse name: Silva Bandy   Number of children: 1   Years of education: Not on file   Highest education level: Not on file  Occupational History   Not on file  Tobacco Use   Smoking status: Former    Packs/day: 1.00    Years: 10.00    Total pack years: 10.00    Types: Cigarettes   Smokeless tobacco: Never  Vaping Use   Vaping Use: Never used  Substance and Sexual Activity   Alcohol use: Yes    Comment: occasionally   Drug use: Never   Sexual activity: Not Currently    Partners: Female  Other Topics Concern   Not on file  Social History Narrative   Not on file   Social Determinants of Health   Financial Resource Strain: Low Risk  (05/04/2020)   Overall Financial Resource Strain (CARDIA)    Difficulty of Paying Living Expenses: Not hard at  all  Food Insecurity: No Food Insecurity (05/05/2021)   Hunger Vital Sign    Worried About Running Out of Food in the Last Year: Never true    [place] in the Last Year: Never true  Transportation Needs: No Transportation Needs (05/05/2021)   PRAPARE - Hydrologist (Medical): No    Lack of Transportation (Non-Medical): No  Physical Activity: Insufficiently Active (05/04/2020)   Exercise Vital Sign    Days of Exercise per Week: 2 days    Minutes of Exercise per Session: 30 min  Stress: No Stress Concern Present (05/04/2020)   [place]    Feeling of Stress : Not at all  Social Connections: Moderately Isolated (05/04/2020)   Social Connection and Isolation Panel [NHANES]    Frequency of Communication with Friends and Family: Three times a week    Frequency of Social Gatherings with Friends and Family: Never    Attends Religious Services: Never    Marine scientist or Organizations: No    Attends Music therapist: Never    Marital Status: Married    Review of Systems  Constitutional:  Negative for chills, fatigue, fever and unexpected weight change.  HENT:  Negative for congestion, ear pain, sinus pain and sore throat.   Cardiovascular:  Negative for chest pain and palpitations.  Gastrointestinal:  Negative for abdominal pain, blood in stool, constipation, diarrhea, nausea and vomiting.  Endocrine: Negative for polydipsia.  Genitourinary:  Negative for dysuria.  Musculoskeletal:  Negative for back pain.  Skin:  Negative for rash.  Neurological:  Negative for headaches.     Objective:  BP 90/60   Pulse 73   Temp (!) 97.3 F (36.3 C)   Resp 16   Ht '6\' 1"'$  (1.854 m)   Wt 288 lb (130.6 kg)   SpO2 98%   BMI 38.00 kg/m      02/15/2022    8:16 AM 02/08/2022    3:28 PM 02/01/2022    3:00 PM  BP/Weight  Systolic BP 90 527 782  Diastolic BP 60 60 60  Wt.  (Lbs) 288 282 287  BMI 38 kg/m2 37.21 kg/m2 37.87 kg/m2    Physical Exam Vitals reviewed.  Constitutional:      Appearance: Normal appearance. He is ill-appearing.  HENT:  Head: Normocephalic and atraumatic.     Right Ear: Tympanic membrane normal.     Nose: Nose normal.     Mouth/Throat:     Mouth: Mucous membranes are moist.  Eyes:     Extraocular Movements: Extraocular movements intact.     Conjunctiva/sclera: Conjunctivae normal.     Pupils: Pupils are equal, round, and reactive to light.  Cardiovascular:     Rate and Rhythm: Normal rate and regular rhythm.     Pulses: Normal pulses.     Heart sounds: Normal heart sounds. No murmur heard.    No gallop.  Pulmonary:     Effort: Pulmonary effort is normal. No respiratory distress.     Breath sounds: Normal breath sounds. No wheezing.  Musculoskeletal:     Cervical back: Normal range of motion.  Skin:    Capillary Refill: Capillary refill takes 2 to 3 seconds.     Findings: Lesion present.     Comments: 5 by 6 cm stage one ulcer latera; aspect left leg  Neurological:     General: No focal deficit present.     Mental Status: He is alert and oriented to person, place, and time.    Diabetic Foot Exam - Simple   No data filed      Lab Results  Component Value Date   WBC 6.4 10/05/2021   HGB 9.4 (L) 10/05/2021   HCT 31.2 (L) 10/05/2021   PLT 126 (L) 10/05/2021   GLUCOSE 128 (H) 01/24/2022   CHOL 78 (L) 08/09/2021   TRIG 72 08/09/2021   HDL 27 (L) 08/09/2021   LDLCALC 35 08/09/2021   ALT 22 10/05/2021   AST 26 10/05/2021   NA 146 (H) 01/24/2022   K 3.9 01/24/2022   CL 103 01/24/2022   CREATININE 2.53 (H) 01/24/2022   BUN 44 (H) 01/24/2022   CO2 26 01/24/2022   INR 2.6 (H) 01/13/2022   HGBA1C 6.2 (H) 08/09/2021      Assessment & Plan:   Problem List Items Addressed This Visit   None Visit Diagnoses     Cellulitis of left lower extremity    -  Primary   Relevant Orders   Ambulatory referral to  [facility] The left leg has broken down more and s swollen.  The leg unna booted, keep elevated     .    Orders Placed This Encounter  Procedures   Ambulatory referral to Home Health     Follow-up: Return in about 1 month (around 03/18/2022).  An After Visit Summary was printed and given to the patient.  Reinaldo Meeker, MD [facility] [phone]

## 2022-02-17 DIAGNOSIS — I420 Dilated cardiomyopathy: Secondary | ICD-10-CM | POA: Diagnosis not present

## 2022-02-17 DIAGNOSIS — I252 Old myocardial infarction: Secondary | ICD-10-CM | POA: Diagnosis not present

## 2022-02-17 DIAGNOSIS — Z7401 Bed confinement status: Secondary | ICD-10-CM | POA: Diagnosis not present

## 2022-02-17 DIAGNOSIS — Z7409 Other reduced mobility: Secondary | ICD-10-CM | POA: Diagnosis not present

## 2022-02-17 DIAGNOSIS — I498 Other specified cardiac arrhythmias: Secondary | ICD-10-CM | POA: Diagnosis not present

## 2022-02-17 DIAGNOSIS — Z7901 Long term (current) use of anticoagulants: Secondary | ICD-10-CM | POA: Diagnosis not present

## 2022-02-17 DIAGNOSIS — J449 Chronic obstructive pulmonary disease, unspecified: Secondary | ICD-10-CM | POA: Diagnosis not present

## 2022-02-17 DIAGNOSIS — I340 Nonrheumatic mitral (valve) insufficiency: Secondary | ICD-10-CM | POA: Diagnosis not present

## 2022-02-17 DIAGNOSIS — I129 Hypertensive chronic kidney disease with stage 1 through stage 4 chronic kidney disease, or unspecified chronic kidney disease: Secondary | ICD-10-CM | POA: Diagnosis not present

## 2022-02-17 DIAGNOSIS — Z515 Encounter for palliative care: Secondary | ICD-10-CM | POA: Diagnosis not present

## 2022-02-17 DIAGNOSIS — Z87891 Personal history of nicotine dependence: Secondary | ICD-10-CM | POA: Diagnosis not present

## 2022-02-17 DIAGNOSIS — N184 Chronic kidney disease, stage 4 (severe): Secondary | ICD-10-CM | POA: Diagnosis not present

## 2022-02-17 DIAGNOSIS — Z66 Do not resuscitate: Secondary | ICD-10-CM | POA: Diagnosis not present

## 2022-02-17 DIAGNOSIS — J9601 Acute respiratory failure with hypoxia: Secondary | ICD-10-CM | POA: Diagnosis not present

## 2022-02-17 DIAGNOSIS — R609 Edema, unspecified: Secondary | ICD-10-CM | POA: Diagnosis not present

## 2022-02-17 DIAGNOSIS — N189 Chronic kidney disease, unspecified: Secondary | ICD-10-CM | POA: Diagnosis not present

## 2022-02-17 DIAGNOSIS — J90 Pleural effusion, not elsewhere classified: Secondary | ICD-10-CM | POA: Diagnosis not present

## 2022-02-17 DIAGNOSIS — Z79899 Other long term (current) drug therapy: Secondary | ICD-10-CM | POA: Diagnosis not present

## 2022-02-17 DIAGNOSIS — I5021 Acute systolic (congestive) heart failure: Secondary | ICD-10-CM | POA: Diagnosis not present

## 2022-02-17 DIAGNOSIS — I5023 Acute on chronic systolic (congestive) heart failure: Secondary | ICD-10-CM | POA: Diagnosis not present

## 2022-02-17 DIAGNOSIS — I130 Hypertensive heart and chronic kidney disease with heart failure and stage 1 through stage 4 chronic kidney disease, or unspecified chronic kidney disease: Secondary | ICD-10-CM | POA: Diagnosis not present

## 2022-02-17 DIAGNOSIS — E875 Hyperkalemia: Secondary | ICD-10-CM | POA: Diagnosis not present

## 2022-02-17 DIAGNOSIS — D638 Anemia in other chronic diseases classified elsewhere: Secondary | ICD-10-CM | POA: Diagnosis not present

## 2022-02-17 DIAGNOSIS — Z7982 Long term (current) use of aspirin: Secondary | ICD-10-CM | POA: Diagnosis not present

## 2022-02-17 DIAGNOSIS — D509 Iron deficiency anemia, unspecified: Secondary | ICD-10-CM | POA: Diagnosis not present

## 2022-02-17 DIAGNOSIS — I4821 Permanent atrial fibrillation: Secondary | ICD-10-CM | POA: Diagnosis not present

## 2022-02-17 DIAGNOSIS — D631 Anemia in chronic kidney disease: Secondary | ICD-10-CM | POA: Diagnosis not present

## 2022-02-17 DIAGNOSIS — Z955 Presence of coronary angioplasty implant and graft: Secondary | ICD-10-CM | POA: Diagnosis not present

## 2022-02-17 DIAGNOSIS — Z950 Presence of cardiac pacemaker: Secondary | ICD-10-CM | POA: Diagnosis not present

## 2022-02-17 DIAGNOSIS — R531 Weakness: Secondary | ICD-10-CM | POA: Diagnosis not present

## 2022-02-17 DIAGNOSIS — J811 Chronic pulmonary edema: Secondary | ICD-10-CM | POA: Diagnosis not present

## 2022-02-17 DIAGNOSIS — E785 Hyperlipidemia, unspecified: Secondary | ICD-10-CM | POA: Diagnosis not present

## 2022-02-17 DIAGNOSIS — G4733 Obstructive sleep apnea (adult) (pediatric): Secondary | ICD-10-CM | POA: Diagnosis not present

## 2022-02-17 DIAGNOSIS — E1122 Type 2 diabetes mellitus with diabetic chronic kidney disease: Secondary | ICD-10-CM | POA: Diagnosis not present

## 2022-02-17 DIAGNOSIS — Z95818 Presence of other cardiac implants and grafts: Secondary | ICD-10-CM | POA: Diagnosis not present

## 2022-02-17 DIAGNOSIS — R0902 Hypoxemia: Secondary | ICD-10-CM | POA: Diagnosis not present

## 2022-02-17 DIAGNOSIS — I5022 Chronic systolic (congestive) heart failure: Secondary | ICD-10-CM | POA: Diagnosis not present

## 2022-02-17 DIAGNOSIS — J9811 Atelectasis: Secondary | ICD-10-CM | POA: Diagnosis not present

## 2022-02-17 DIAGNOSIS — R7989 Other specified abnormal findings of blood chemistry: Secondary | ICD-10-CM | POA: Diagnosis not present

## 2022-02-17 DIAGNOSIS — I509 Heart failure, unspecified: Secondary | ICD-10-CM | POA: Diagnosis not present

## 2022-02-17 DIAGNOSIS — I452 Bifascicular block: Secondary | ICD-10-CM | POA: Diagnosis not present

## 2022-02-17 DIAGNOSIS — I2510 Atherosclerotic heart disease of native coronary artery without angina pectoris: Secondary | ICD-10-CM | POA: Diagnosis not present

## 2022-02-17 DIAGNOSIS — I255 Ischemic cardiomyopathy: Secondary | ICD-10-CM | POA: Diagnosis not present

## 2022-02-17 DIAGNOSIS — R0602 Shortness of breath: Secondary | ICD-10-CM | POA: Diagnosis not present

## 2022-02-17 DIAGNOSIS — Z743 Need for continuous supervision: Secondary | ICD-10-CM | POA: Diagnosis not present

## 2022-02-17 DIAGNOSIS — R7303 Prediabetes: Secondary | ICD-10-CM | POA: Diagnosis not present

## 2022-02-17 DIAGNOSIS — D696 Thrombocytopenia, unspecified: Secondary | ICD-10-CM | POA: Diagnosis not present

## 2022-02-18 NOTE — Addendum Note (Signed)
Addended byRochel Brome on: 02/18/2022 11:04 PM   Modules accepted: Level of Service

## 2022-03-05 DIAGNOSIS — J9611 Chronic respiratory failure with hypoxia: Secondary | ICD-10-CM | POA: Diagnosis not present

## 2022-03-07 DIAGNOSIS — J9611 Chronic respiratory failure with hypoxia: Secondary | ICD-10-CM | POA: Diagnosis not present

## 2022-03-08 DEATH — deceased

## 2022-03-21 ENCOUNTER — Ambulatory Visit: Payer: Medicare Other | Admitting: Legal Medicine

## 2022-05-22 ENCOUNTER — Ambulatory Visit: Payer: Medicare Other | Admitting: Nurse Practitioner

## 2023-07-31 IMAGING — DX DG CHEST 1V PORT
1 series · 1 of 1 positions shown · non-contrast
Comparison: 12/14/2020

CLINICAL DATA: Shortness of breath for 2 days, initial encounter

EXAM:
PORTABLE CHEST 1 VIEW

[chest ap]
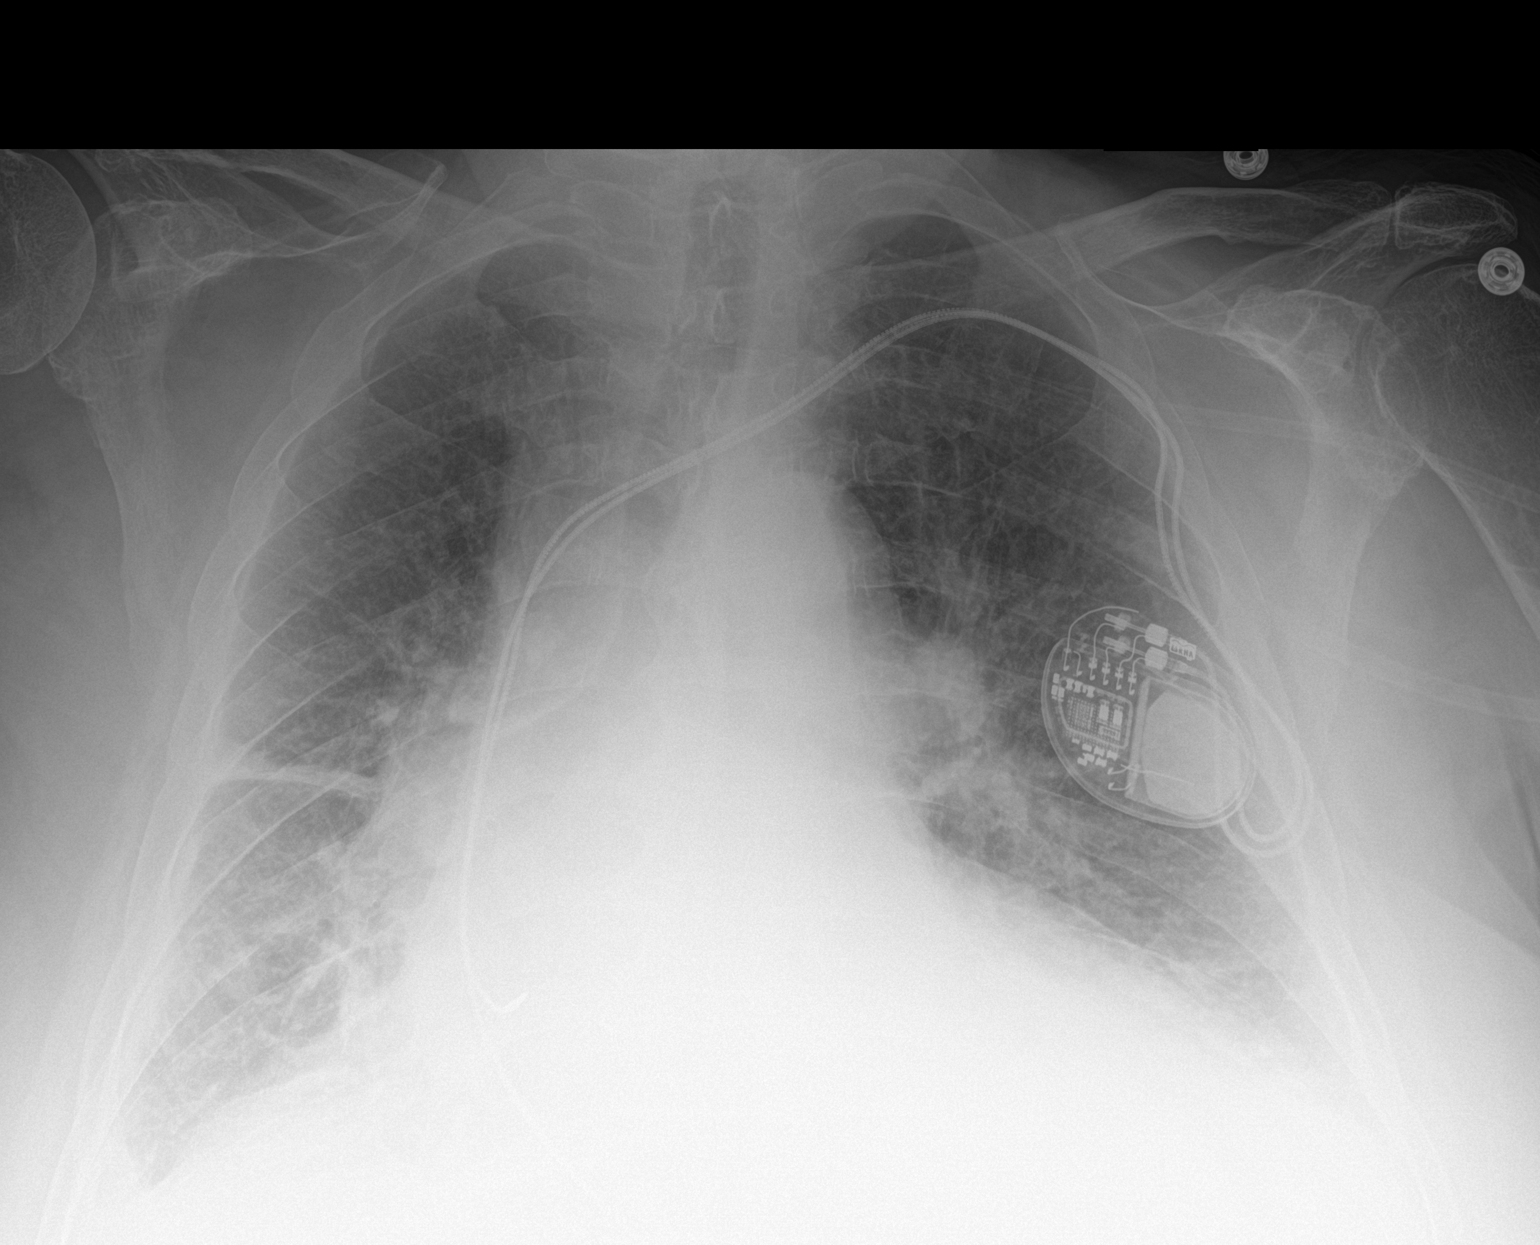

[1 of 1 positions shown; findings below may reference images not displayed]

FINDINGS: Cardiac shadow is enlarged. Pacing device is noted and stable.
Increased vascular congestion is noted with mild interstitial edema.
Chronic scarring along the minor fissure is noted. Mild bibasilar
atelectasis is noted. No bony abnormality is seen.
IMPRESSION: Vascular congestion and basilar atelectasis.
